# Patient Record
Sex: Female | Born: 1950 | Race: White | Hispanic: No | Marital: Married | Smoking: Never smoker
Health system: Southern US, Community
[De-identification: ages and names within clinical notes are randomized; demographics above are authoritative.]

## PROBLEM LIST (undated history)

## (undated) DIAGNOSIS — E7800 Pure hypercholesterolemia, unspecified: Secondary | ICD-10-CM

## (undated) DIAGNOSIS — Z973 Presence of spectacles and contact lenses: Secondary | ICD-10-CM

## (undated) DIAGNOSIS — I10 Essential (primary) hypertension: Secondary | ICD-10-CM

## (undated) DIAGNOSIS — K219 Gastro-esophageal reflux disease without esophagitis: Secondary | ICD-10-CM

## (undated) DIAGNOSIS — C50919 Malignant neoplasm of unspecified site of unspecified female breast: Secondary | ICD-10-CM

## (undated) DIAGNOSIS — Z9889 Other specified postprocedural states: Secondary | ICD-10-CM

## (undated) DIAGNOSIS — Z9289 Personal history of other medical treatment: Secondary | ICD-10-CM

## (undated) DIAGNOSIS — R12 Heartburn: Secondary | ICD-10-CM

## (undated) DIAGNOSIS — R002 Palpitations: Secondary | ICD-10-CM

## (undated) DIAGNOSIS — Z923 Personal history of irradiation: Secondary | ICD-10-CM

## (undated) HISTORY — PX: THYROID SURGERY: SHX805

## (undated) HISTORY — PX: CHOLECYSTECTOMY: SHX55

## (undated) HISTORY — PX: COLONOSCOPY: SHX174

## (undated) HISTORY — DX: Pure hypercholesterolemia, unspecified: E78.00

## (undated) HISTORY — DX: Heartburn: R12

## (undated) HISTORY — DX: Other specified postprocedural states: Z98.890

## (undated) HISTORY — PX: OTHER SURGICAL HISTORY: SHX169

## (undated) HISTORY — DX: Malignant neoplasm of unspecified site of unspecified female breast: C50.919

## (undated) HISTORY — DX: Personal history of other medical treatment: Z92.89

## (undated) HISTORY — DX: Presence of spectacles and contact lenses: Z97.3

## (undated) HISTORY — DX: Gastro-esophageal reflux disease without esophagitis: K21.9

## (undated) HISTORY — DX: Palpitations: R00.2

---

## 1997-11-28 ENCOUNTER — Ambulatory Visit (HOSPITAL_COMMUNITY): Admission: RE | Admit: 1997-11-28 | Discharge: 1997-11-28 | Payer: Self-pay | Admitting: Obstetrics and Gynecology

## 1999-06-05 ENCOUNTER — Other Ambulatory Visit: Admission: RE | Admit: 1999-06-05 | Discharge: 1999-06-05 | Payer: Self-pay | Admitting: Gynecology

## 2000-08-04 ENCOUNTER — Other Ambulatory Visit: Admission: RE | Admit: 2000-08-04 | Discharge: 2000-08-04 | Payer: Self-pay | Admitting: Obstetrics and Gynecology

## 2000-08-22 ENCOUNTER — Encounter: Admission: RE | Admit: 2000-08-22 | Discharge: 2000-08-22 | Payer: Self-pay | Admitting: General Surgery

## 2000-08-22 ENCOUNTER — Encounter (HOSPITAL_BASED_OUTPATIENT_CLINIC_OR_DEPARTMENT_OTHER): Payer: Self-pay | Admitting: General Surgery

## 2000-10-31 ENCOUNTER — Other Ambulatory Visit: Admission: RE | Admit: 2000-10-31 | Discharge: 2000-10-31 | Payer: Self-pay | Admitting: Obstetrics and Gynecology

## 2001-02-22 ENCOUNTER — Encounter: Payer: Self-pay | Admitting: Obstetrics and Gynecology

## 2001-02-22 ENCOUNTER — Encounter: Admission: RE | Admit: 2001-02-22 | Discharge: 2001-02-22 | Payer: Self-pay | Admitting: Obstetrics and Gynecology

## 2001-08-02 ENCOUNTER — Encounter: Payer: Self-pay | Admitting: Chiropractic Medicine

## 2001-08-02 ENCOUNTER — Encounter: Admission: RE | Admit: 2001-08-02 | Discharge: 2001-08-02 | Payer: Self-pay | Admitting: Chiropractic Medicine

## 2001-09-07 ENCOUNTER — Other Ambulatory Visit: Admission: RE | Admit: 2001-09-07 | Discharge: 2001-09-07 | Payer: Self-pay | Admitting: Obstetrics and Gynecology

## 2002-09-14 ENCOUNTER — Other Ambulatory Visit: Admission: RE | Admit: 2002-09-14 | Discharge: 2002-09-14 | Payer: Self-pay | Admitting: Obstetrics and Gynecology

## 2003-10-03 ENCOUNTER — Other Ambulatory Visit: Admission: RE | Admit: 2003-10-03 | Discharge: 2003-10-03 | Payer: Self-pay | Admitting: Obstetrics and Gynecology

## 2004-03-03 ENCOUNTER — Encounter: Admission: RE | Admit: 2004-03-03 | Discharge: 2004-03-03 | Payer: Self-pay | Admitting: Family Medicine

## 2004-07-28 ENCOUNTER — Ambulatory Visit (HOSPITAL_COMMUNITY): Admission: RE | Admit: 2004-07-28 | Discharge: 2004-07-28 | Payer: Self-pay | Admitting: *Deleted

## 2004-07-28 ENCOUNTER — Encounter (INDEPENDENT_AMBULATORY_CARE_PROVIDER_SITE_OTHER): Payer: Self-pay | Admitting: *Deleted

## 2004-11-16 ENCOUNTER — Other Ambulatory Visit: Admission: RE | Admit: 2004-11-16 | Discharge: 2004-11-16 | Payer: Self-pay | Admitting: Obstetrics and Gynecology

## 2005-10-01 ENCOUNTER — Ambulatory Visit (HOSPITAL_COMMUNITY): Admission: RE | Admit: 2005-10-01 | Discharge: 2005-10-01 | Payer: Self-pay | Admitting: *Deleted

## 2005-10-01 ENCOUNTER — Encounter (INDEPENDENT_AMBULATORY_CARE_PROVIDER_SITE_OTHER): Payer: Self-pay | Admitting: Specialist

## 2006-02-23 ENCOUNTER — Encounter (INDEPENDENT_AMBULATORY_CARE_PROVIDER_SITE_OTHER): Payer: Self-pay | Admitting: *Deleted

## 2006-02-23 ENCOUNTER — Ambulatory Visit (HOSPITAL_COMMUNITY): Admission: RE | Admit: 2006-02-23 | Discharge: 2006-02-23 | Payer: Self-pay | Admitting: Obstetrics and Gynecology

## 2006-03-03 ENCOUNTER — Other Ambulatory Visit: Admission: RE | Admit: 2006-03-03 | Discharge: 2006-03-03 | Payer: Self-pay | Admitting: *Deleted

## 2006-06-02 ENCOUNTER — Encounter (INDEPENDENT_AMBULATORY_CARE_PROVIDER_SITE_OTHER): Payer: Self-pay | Admitting: *Deleted

## 2006-06-02 ENCOUNTER — Ambulatory Visit (HOSPITAL_COMMUNITY): Admission: RE | Admit: 2006-06-02 | Discharge: 2006-06-03 | Payer: Self-pay | Admitting: Surgery

## 2006-07-19 HISTORY — PX: TOTAL THYROIDECTOMY: SHX2547

## 2006-11-01 ENCOUNTER — Ambulatory Visit: Payer: Self-pay | Admitting: Vascular Surgery

## 2006-11-15 ENCOUNTER — Ambulatory Visit: Payer: Self-pay | Admitting: Vascular Surgery

## 2007-02-28 ENCOUNTER — Ambulatory Visit: Payer: Self-pay | Admitting: Vascular Surgery

## 2007-08-10 ENCOUNTER — Ambulatory Visit: Payer: Self-pay | Admitting: Vascular Surgery

## 2007-12-19 ENCOUNTER — Ambulatory Visit: Payer: Self-pay | Admitting: Vascular Surgery

## 2008-03-04 ENCOUNTER — Ambulatory Visit (HOSPITAL_COMMUNITY): Admission: RE | Admit: 2008-03-04 | Discharge: 2008-03-04 | Payer: Self-pay | Admitting: *Deleted

## 2008-03-04 ENCOUNTER — Encounter (INDEPENDENT_AMBULATORY_CARE_PROVIDER_SITE_OTHER): Payer: Self-pay | Admitting: *Deleted

## 2009-04-14 DIAGNOSIS — Z9889 Other specified postprocedural states: Secondary | ICD-10-CM

## 2009-04-14 HISTORY — DX: Other specified postprocedural states: Z98.890

## 2010-08-06 ENCOUNTER — Encounter
Admission: RE | Admit: 2010-08-06 | Discharge: 2010-08-06 | Payer: Self-pay | Source: Home / Self Care | Attending: Obstetrics and Gynecology | Admitting: Obstetrics and Gynecology

## 2010-08-07 ENCOUNTER — Emergency Department (HOSPITAL_COMMUNITY)
Admission: EM | Admit: 2010-08-07 | Discharge: 2010-08-08 | Payer: Self-pay | Source: Home / Self Care | Admitting: Emergency Medicine

## 2010-08-11 LAB — URINE MICROSCOPIC-ADD ON

## 2010-08-11 LAB — URINALYSIS, ROUTINE W REFLEX MICROSCOPIC
Bilirubin Urine: NEGATIVE
Ketones, ur: 15 mg/dL — AB
Nitrite: NEGATIVE
Protein, ur: >300 mg/dL — AB
Specific Gravity, Urine: 1.017 (ref 1.005–1.030)
Urine Glucose, Fasting: NEGATIVE mg/dL
Urobilinogen, UA: 0.2 mg/dL (ref 0.0–1.0)
pH: 5.5 (ref 5.0–8.0)

## 2010-08-11 LAB — URINE CULTURE: Colony Count: >=100000

## 2010-10-19 ENCOUNTER — Other Ambulatory Visit: Payer: Self-pay | Admitting: Chiropractor

## 2010-10-19 DIAGNOSIS — M542 Cervicalgia: Secondary | ICD-10-CM

## 2010-10-20 ENCOUNTER — Ambulatory Visit
Admission: RE | Admit: 2010-10-20 | Discharge: 2010-10-20 | Disposition: A | Discharge status: Home / Self Care | Payer: BC Managed Care – PPO | Source: Ambulatory Visit | Attending: Chiropractor | Admitting: Chiropractor

## 2010-10-20 DIAGNOSIS — M542 Cervicalgia: Secondary | ICD-10-CM

## 2010-12-01 NOTE — Op Note (Signed)
NAMETAKERRA, LUPINACCI           ACCOUNT NO.:  [id number]   MEDICAL RECORD NO.:  [id number]          PATIENT TYPE:  AMB   LOCATION:  ENDO                         FACILITY:  [facility]   PHYSICIAN:  Georgiana Spinner, M.D.    DATE OF BIRTH:  10-13-1950   DATE OF PROCEDURE:  DATE OF DISCHARGE:                               OPERATIVE REPORT   PROCEDURE:  Upper endoscopy.   INDICATIONS:  Gastroesophageal reflux disease with question of Barrett  esophagus.   ANESTHESIA:  Fentanyl 100 mcg, Versed 10 mg.   PROCEDURE:  With the patient mildly sedated in the left lateral  decubitus position the Pentax videoscopic endoscope was inserted in the  mouth, passed under direct vision through the esophagus which appeared  normal until we reached the distal esophagus and there appeared to be an  area of Barrett's, photographed and biopsied.  We entered and we pulled  back and there was a subsequent area on the other side of the esophagus  which also appeared to be possibly Barrett's, photographed and biopsied.  We entered into the stomach fundus, body, antrum, duodenal bulb, second  portion duodenum were visualized.  From this point the endoscope was  slowly withdrawn taking circumferential views of duodenal mucosa until  the endoscope had been pulled back into stomach and placed in  retroflexion to view the stomach from below and a small polyp was seen,  photographed and biopsied.  The endoscope was straightened and withdrawn  taking circumferential views of the remaining gastric and esophageal  mucosa.  The patient's vital signs and pulse oximeter remained stable.  The patient tolerated the procedure well without apparent complication.   FINDINGS:  1. Polyp in the fundus of the stomach biopsied.  2. Changes of possible Barrett's esophagus biopsied.  3. Await biopsy reports.  4. The patient will call me for results and follow-up with me as      needed as an outpatient.     ______________________________  Georgiana Spinner, M.D.     GMO/MEDQ  D:  03/04/2008  T:  03/04/2008  Job:  [id number]

## 2010-12-01 NOTE — Assessment & Plan Note (Signed)
OFFICE VISIT   Elizabeth, Berg  DOB:  05/08/1951                                       02/28/2007  OACZY#:[id number]   SUBJECTIVE:  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  She has worn elastic  compression stockings with no improvement of her symptoms which she  describes as an aching and burning discomfort in the distal thigh and  proximal calf area.  She has some improvement with elevation but this  does not completely relieve her symptoms.  She has been trying  analgesics as well.  Venous duplex exam was performed which revealed an  incompetent branch off the saphenofemoral junction on the left side  which eventually communicates with these varicosities and does have  reflux within it.  The right side has an anterior branch of the  circumflex vein which is incompetent but then becomes quite small.  There is no reflux at the saphenofemoral junction bilaterally.   IMPRESSION AND PLAN:  I think her painful varicosities would be best  treated by a combination of sclerotherapy and skin laser treatments in  the left distal thigh and proximal calf area and in the right medial  calf area as well as around the ankles on both sides.  Because she does  have venous hypertension, from the reflux in these branches off the  greater saphenous system, we will proceed with precertification with her  insurance company, to treat these painful varicosities.   Quita Skye Hart [place], M.D.  Electronically Signed   JDL/MEDQ  D:  02/28/2007  T:  03/02/2007  Job:  [id number]

## 2010-12-01 NOTE — Assessment & Plan Note (Signed)
OFFICE VISIT   Elizabeth Berg, Elizabeth Berg  DOB:  29-Mar-1951                                       12/19/2007  HYQMV#:[id number]   The patient underwent sclerotherapy and superficial skin laser  treatments of varicosities in January of this year.  She had no large  great saphenous vein with reflux on her preoperative duplex scan.  She  did have some reflux through some incompetent branches in both legs.  She has been fairly pleased with her cosmetic result with the exception  of some mild erythema in the lower third of her legs at times but does  occasionally have discomfort in the calves which seems to be relieved by  wearing elastic compression stockings for a few days.  She has had no  consistent edema in the ankle or foot.   On exam the larger varicosities noted before have been treated  successfully.  She does have some spider veins in clusters in the lower  third of both legs.  There is no distal edema.  She has excellent  arterial pulses.  She was reassured regarding these findings and I do  not think any further treatment of the varicosities is indicated at this  time.   Quita Skye Hart [place], M.D.  Electronically Signed   JDL/MEDQ  D:  12/19/2007  T:  12/20/2007  Job:  [id number]

## 2010-12-04 NOTE — Op Note (Signed)
NAMETYESE, FINKEN           ACCOUNT NO.:  [id number]   MEDICAL RECORD NO.:  [id number]          PATIENT TYPE:  AMB   LOCATION:  ENDO                         FACILITY:  [facility]   PHYSICIAN:  Georgiana Spinner, M.D.    DATE OF BIRTH:  [birth year]   DATE OF PROCEDURE:  07/28/2004  DATE OF DISCHARGE:                                 OPERATIVE REPORT   PROCEDURE:  Colonoscopy.   INDICATIONS:  Rectal bleeding.   ANESTHESIA:  1.  Demerol 25.  2.  Versed 2.5 mg.   PROCEDURE:  With the patient mildly sedated in the left lateral decubitus  position, the Olympus videoscopic colonoscope was inserted in the rectum and  passed under direct vision to the cecum, identified by the ileocecal valve  and appendiceal orifice, both of which were photographed.  From this point  the colonoscope was slowly withdrawn, taking circumferential views of the  colonic mucosa, stopping only in the rectum which appeared normal on direct  and showed some mild hemorrhoids on retroflexed view.  The endoscope was  straightened and withdrawn.  Patient's vital signs and pulse oximetry  remained stable.  The patient tolerated the procedure without apparent  complications.   FINDINGS:  Internal hemorrhoids, otherwise an unremarkable colonoscopic  examination to the cecum.   PLAN:  See endoscopy note for further followup.       GMO/MEDQ  D:  07/28/2004  T:  07/28/2004  Job:  [id number]

## 2010-12-04 NOTE — Op Note (Signed)
Elizabeth Berg, Elizabeth Berg           ACCOUNT NO.:  [id number]   MEDICAL RECORD NO.:  [id number]          PATIENT TYPE:  AMB   LOCATION:  SDC                           FACILITY:  [facility]   PHYSICIAN:  Malva Limes, M.D.    DATE OF BIRTH:  [birth year]   DATE OF PROCEDURE:  02/23/2006  DATE OF DISCHARGE:                                 OPERATIVE REPORT   PREOPERATIVE DIAGNOSES:  1.  Prolapsed submucous fibroid.  2.  Postmenopausal bleeding.   POSTOPERATIVE DIAGNOSES:  1.  Prolapsed submucous fibroid.  2.  Postmenopausal bleeding.   PROCEDURES:  1.  Resection of fibroid.  2.  D&C.   SURGEON:  Malva Limes, M.D.   ANESTHESIA:  MAC with paracervical block.   DRAINS:  None.   ANTIBIOTICS:  Ancef 1 g.   COMPLICATIONS:  None.   SPECIMENS:  Fibroid and endometrial curettings sent to pathology.   PROCEDURE:  The patient was taken to the operating room, where she was  placed in the dorsal lithotomy position.  MAC anesthesia was administered  without complications.  She was then prepped with Betadine and draped in the  usual fashion for this procedure.  Then, 20 mL of 1% lidocaine was used for  paracervical block.  The patient had a 3 cm fibroid prolapsing through her  cervical os.  This was grasped with a single-tooth tenaculum and the stalk  palpated to originate in the posterior lower uterine segment of the uterus.  At this point, the Mayo scissors were used to transect the fibroid at the  base. The specimen was handed off and sent to pathology.  Following this,  the hysteroscope was set up and placed into the uterine cavity. On viewing  the endometrial lining, the patient had no evidence of any kind of polyps or  other fibroids. Following this, the patient had had an endometrial  curettage.  The  tissue was sent to pathology.  The patient tolerated the procedure well.  She was taken to the recovery room in stable condition.  The instrument and  lap count was correct x1.  The  patient will be discharged to home with  Darvocet to take p.r.n.  She will follow up in the office in 4 weeks.           ______________________________  Malva Limes, M.D.     MA/MEDQ  D:  02/23/2006  T:  02/23/2006  Job:  [id number]

## 2010-12-04 NOTE — Op Note (Signed)
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   PROCEDURE:  Total thyroidectomy.   SURGEON:  Velora Heckler, MD, FACS   ASSISTANT:  Lorelee New, MD and Manus Rudd, MD   ANESTHESIA:  General.   ESTIMATED BLOOD LOSS:  Minimal.   PREPARATION:  Betadine.   COMPLICATIONS:  None.   INDICATIONS:  The patient is 60 year old white female from [place],  [place].  The patient was noted on evaluation by her chiropractor to  have an enlarged thyroid gland.  She underwent thyroid ultrasound and  nuclear medicine scanning.  She was found to have multinodular goiter.  Bilateral fine-needle aspiration cytology was performed and demonstrated  Hurthle cell change.  The patient had developed mild dysphagia, particularly  when taking pills.  She now comes to surgery for thyroidectomy.   Procedure is done in OR #17 at the [facility].  The  patient is brought to the operating room, placed in a supine position on the  operating room table.  Following administration of general anesthesia, the  patient is positioned and then prepped and draped in the usual strict  aseptic fashion.  After ascertaining that an adequate level of anesthesia  been obtained, a Kocher incision was made with a #15 blade.  Dissection is  carried through subcutaneous tissues with the electrocautery and hemostasis  obtained.  Platysma is divided.  Skin flaps  were then developed cephalad and  caudad from the thyroid notch to the sternal notch.  A Mahorner self-  retaining retractor is placed for exposure.  Strap muscles are incised in  the midline.  Dissection is begun on the left side of the neck.  Left  thyroid lobe was gently mobilized with a blunt Barista.  Venous  tributaries are taken down with the harmonic scalpel.  Lobe is gently rolled  anteriorly.  Inferior venous tributaries are divided with the harmonic  scalpel.  Care is taken to preserve parathyroid tissue.  Superior pole is  dissected out.  Superior pole vessels are divided with the harmonic scalpel.  Gland is rolled anteriorly.  Branches of the inferior thyroid artery are  divided with the harmonic scalpel.  Recurrent nerve is identified and  preserved.  Dissection is carried down to the ligament of Allyson Sabal which is  transected with the electrocautery, and the gland is rolled up and onto the  anterior trachea.   Next, we turned our attention to the right thyroid lobe.  Again, strap  muscles are gently mobilized off the anterior capsule of the thyroid and  reflected laterally.  Venous tributaries are divided with the harmonic  scalpel.  The inferior parathyroid gland is identified on the capsule of the  thyroid on the right.  It is mobilized gently using the electrocautery for  hemostasis.  It is dissected off the capsule on its vascular pedicle which  is preserved.  Inferior venous tributaries are divided with the harmonic  scalpel.  Superior pole is dissected out.  There is a small pyramidal lobe  which is also dissected out using the harmonic scalpel.  Superior pole  vessels are divided with the harmonic scalpel.  Gland is rolled anteriorly.  Branches of the inferior thyroid artery are divided between small Ligaclips  near the recurrent nerve which is positively identified and preserved.  There is a moderate size cystic nodule adjacent to the parathyroid.  This  plane  is gently developed and vessels divided between Ligaclips with the  scissors.  Gland is rolled anteriorly.  Ligament of Allyson Sabal is transected  using the electrocautery.  Remainder of the thyroid is excised off the  anterior trachea using the harmonic scalpel.  Specimen is submitted to  pathology for review.  Neck is irrigated with warm saline, and Surgicel was  placed over the area of the recurrent nerves and parathyroid glands  bilaterally.  Good hemostasis is achieved.  Strap muscles are reapproximated  in the midline with interrupted 3-0 Vicryl sutures.  Platysma is closed with  interrupted 3-0 Vicryl sutures.  Skin is closed with a running 4-0 Vicryl  subcuticular suture.  Wound is washed and dried, and Benzoin and Steri-  Strips are applied.  Sterile dressings are applied.  The patient is awakened  from anesthesia and brought to the recovery room in stable condition.  The  patient tolerated the procedure well.      Velora Heckler, MD  Electronically Signed     TMG/MEDQ  D:  06/02/2006  T:  06/02/2006  Job:  [id number]   cc:   Elizabeth Berg, M.D.  Elizabeth Decant. Janey Greaser, MD

## 2010-12-04 NOTE — Op Note (Signed)
Elizabeth Berg, Elizabeth Berg           ACCOUNT NO.:  [id number]   MEDICAL RECORD NO.:  [id number]          PATIENT TYPE:  AMB   LOCATION:  ENDO                         FACILITY:  [facility]   PHYSICIAN:  Georgiana Spinner, M.D.    DATE OF BIRTH:  1950/08/31   DATE OF PROCEDURE:  07/28/2004  DATE OF DISCHARGE:                                 OPERATIVE REPORT   PROCEDURE:  Upper endoscopy.   ENDOSCOPIST:  Georgiana Spinner, M.D.   INDICATIONS FOR PROCEDURE:  Gastroesophageal reflux disease.   ANESTHESIA:  Demerol 75 mg, Versed 7.5 mg.   DESCRIPTION OF PROCEDURE:  With the patient mildly sedated in the left  lateral decubitus position, the Olympus videoscopic endoscope was inserted  in the mouth and passed under direct vision through the esophagus, which  appeared normal until we reached the distal esophagus, which appeared  somewhat inflamed and reddened.  This was photographed and biopsied.  We  entered into the stomach.  The fundus, body, antrum, duodenal bulb, second  portion of the duodenum were visualized and appeared normal.  From this  point the endoscope was then slowly withdrawn, taking circumferential views  of the duodenal mucosa until the endoscope had been pulled back into the  stomach, and placed in retroflexion to view the stomach from below.  It  revealed a very lax GE junction.  I could see up the esophagus, which was  quite patent.  On retroflexed view the endoscope was then straightened and  withdrawn, taking circumferential views of the remaining gastric and  esophageal mucosa.  The patient's vital signs and pulse oximeter remained  stable.   The patient tolerated the procedure well, without apparent complications.   FINDINGS:  Significant laxity of the gastroesophageal junction with free  reflux and erythema of the distal esophagus.   BIOPSY:  Await biopsy report.   PLAN:  The patient will follow up with me as an outpatient.       GMO/MEDQ  D:  07/28/2004  T:   07/28/2004  Job:  [id number]

## 2010-12-04 NOTE — Op Note (Signed)
NAMEJAMI, Elizabeth Berg           ACCOUNT NO.:  [id number]   MEDICAL RECORD NO.:  [id number]          PATIENT TYPE:  AMB   LOCATION:  ENDO                         FACILITY:  [facility]   PHYSICIAN:  Georgiana Spinner, M.D.    DATE OF BIRTH:  Oct 29, 1950   DATE OF PROCEDURE:  10/01/2005  DATE OF DISCHARGE:                                 OPERATIVE REPORT   PROCEDURE:  Upper endoscopy with biopsy.   INDICATIONS:  Possible Barrett's esophagus.   ANESTHESIA:  Demerol 60 Versed 6 mg.   DESCRIPTION OF PROCEDURE:  With the patient mildly sedated in the left  lateral decubitus position, the Olympus videoscopic endoscope was inserted  in the mouth, passed under direct vision through the esophagus which  appeared normal until we reached distal esophagus; and there were changes  consistent with Barrett's, photographed, and biopsies were taken. We entered  into the stomach fundus, body, antrum, duodenal bulb, and second portion  duodenum were visualized. From this point, the endoscope was slowly  withdrawn taking circumferential views of the duodenal mucosa until the  endoscope was then pulled back into the stomach; placed in retroflexion to  review the stomach from below. The endoscope was straightened and withdrawn  taking circumferential views of the remaining gastric and esophageal mucosa.  The patient's vital signs and  pulse oximeter remained stable. The patient  tolerated the procedure well without apparent complications.   FINDINGS:  Widely patent gastroesophageal junction with visualization of the  esophagus seen from the stomach indicating laxity at this point and possible  Barrett's esophagus. Await biopsy report. The patient will call me for  results and follow up with me as an outpatient.           ______________________________  Georgiana Spinner, M.D.     GMO/MEDQ  D:  10/01/2005  T:  10/03/2005  Job:  [id number]

## 2011-01-11 ENCOUNTER — Other Ambulatory Visit: Payer: Self-pay | Admitting: Dermatology

## 2011-07-20 DIAGNOSIS — C50919 Malignant neoplasm of unspecified site of unspecified female breast: Secondary | ICD-10-CM | POA: Insufficient documentation

## 2011-08-18 ENCOUNTER — Other Ambulatory Visit: Payer: Self-pay | Admitting: Dermatology

## 2011-10-25 ENCOUNTER — Other Ambulatory Visit: Payer: Self-pay | Admitting: Dermatology

## 2011-11-02 ENCOUNTER — Other Ambulatory Visit: Payer: Self-pay | Admitting: Obstetrics and Gynecology

## 2011-11-04 ENCOUNTER — Other Ambulatory Visit: Payer: Self-pay | Admitting: Obstetrics and Gynecology

## 2011-11-04 DIAGNOSIS — R928 Other abnormal and inconclusive findings on diagnostic imaging of breast: Secondary | ICD-10-CM

## 2011-11-05 ENCOUNTER — Ambulatory Visit
Admission: RE | Admit: 2011-11-05 | Discharge: 2011-11-05 | Disposition: A | Discharge status: Home / Self Care | Payer: BC Managed Care – PPO | Source: Ambulatory Visit | Attending: Obstetrics and Gynecology | Admitting: Obstetrics and Gynecology

## 2011-11-05 DIAGNOSIS — R928 Other abnormal and inconclusive findings on diagnostic imaging of breast: Secondary | ICD-10-CM

## 2011-11-08 ENCOUNTER — Other Ambulatory Visit: Payer: Self-pay | Admitting: Obstetrics and Gynecology

## 2011-11-08 DIAGNOSIS — N63 Unspecified lump in unspecified breast: Secondary | ICD-10-CM

## 2011-11-12 ENCOUNTER — Other Ambulatory Visit: Payer: BC Managed Care – PPO

## 2011-11-15 ENCOUNTER — Other Ambulatory Visit: Payer: Self-pay | Admitting: Obstetrics and Gynecology

## 2011-11-15 DIAGNOSIS — N63 Unspecified lump in unspecified breast: Secondary | ICD-10-CM

## 2011-11-18 ENCOUNTER — Other Ambulatory Visit: Payer: BC Managed Care – PPO

## 2011-11-22 ENCOUNTER — Other Ambulatory Visit: Payer: Self-pay | Admitting: Diagnostic Radiology

## 2011-11-22 ENCOUNTER — Ambulatory Visit
Admission: RE | Admit: 2011-11-22 | Discharge: 2011-11-22 | Disposition: A | Discharge status: Home / Self Care | Payer: BC Managed Care – PPO | Source: Ambulatory Visit | Attending: Obstetrics and Gynecology | Admitting: Obstetrics and Gynecology

## 2011-11-22 DIAGNOSIS — N63 Unspecified lump in unspecified breast: Secondary | ICD-10-CM

## 2011-11-22 HISTORY — PX: BREAST BIOPSY: SHX20

## 2011-11-23 ENCOUNTER — Other Ambulatory Visit: Payer: Self-pay | Admitting: Obstetrics and Gynecology

## 2011-11-23 DIAGNOSIS — C50911 Malignant neoplasm of unspecified site of right female breast: Secondary | ICD-10-CM

## 2011-11-25 ENCOUNTER — Telehealth: Payer: Self-pay | Admitting: *Deleted

## 2011-11-25 ENCOUNTER — Other Ambulatory Visit: Payer: Self-pay | Admitting: *Deleted

## 2011-11-25 DIAGNOSIS — C50319 Malignant neoplasm of lower-inner quadrant of unspecified female breast: Secondary | ICD-10-CM

## 2011-11-25 DIAGNOSIS — C50311 Malignant neoplasm of lower-inner quadrant of right female breast: Secondary | ICD-10-CM | POA: Insufficient documentation

## 2011-11-25 NOTE — Telephone Encounter (Signed)
Confirmed [facility] for 12/01/11 at 0800 .  Instructions and contact information given.  

## 2011-11-26 ENCOUNTER — Ambulatory Visit
Admission: RE | Admit: 2011-11-26 | Discharge: 2011-11-26 | Disposition: A | Discharge status: Home / Self Care | Payer: BC Managed Care – PPO | Source: Ambulatory Visit | Attending: Obstetrics and Gynecology | Admitting: Obstetrics and Gynecology

## 2011-11-26 DIAGNOSIS — C50911 Malignant neoplasm of unspecified site of right female breast: Secondary | ICD-10-CM

## 2011-11-26 MED ORDER — GADOBENATE DIMEGLUMINE 529 MG/ML IV SOLN
17.0000 mL | Freq: Once | INTRAVENOUS | Status: AC | PRN
  Administered 2011-11-26: 17 mL via INTRAVENOUS

## 2011-12-01 ENCOUNTER — Other Ambulatory Visit (HOSPITAL_COMMUNITY): Payer: Self-pay | Admitting: Surgery

## 2011-12-01 ENCOUNTER — Encounter: Payer: Self-pay | Admitting: *Deleted

## 2011-12-01 ENCOUNTER — Encounter: Payer: Self-pay | Admitting: Oncology

## 2011-12-01 ENCOUNTER — Encounter: Payer: Self-pay | Admitting: Specialist

## 2011-12-01 ENCOUNTER — Ambulatory Visit (HOSPITAL_BASED_OUTPATIENT_CLINIC_OR_DEPARTMENT_OTHER): Payer: BC Managed Care – PPO | Admitting: Oncology

## 2011-12-01 ENCOUNTER — Ambulatory Visit
Admission: RE | Admit: 2011-12-01 | Discharge: 2011-12-01 | Disposition: A | Discharge status: Home / Self Care | Payer: BC Managed Care – PPO | Source: Ambulatory Visit | Attending: Radiation Oncology | Admitting: Radiation Oncology

## 2011-12-01 ENCOUNTER — Ambulatory Visit: Payer: BC Managed Care – PPO

## 2011-12-01 ENCOUNTER — Ambulatory Visit: Payer: BC Managed Care – PPO | Attending: Surgery | Admitting: Physical Therapy

## 2011-12-01 ENCOUNTER — Ambulatory Visit (HOSPITAL_BASED_OUTPATIENT_CLINIC_OR_DEPARTMENT_OTHER): Payer: BC Managed Care – PPO | Admitting: Surgery

## 2011-12-01 ENCOUNTER — Other Ambulatory Visit: Payer: BC Managed Care – PPO | Admitting: Lab

## 2011-12-01 ENCOUNTER — Telehealth: Payer: Self-pay | Admitting: Oncology

## 2011-12-01 VITALS — BP 134/84 | HR 73 | Temp 98.6°F | Ht 63.5 in | Wt 191.1 lb

## 2011-12-01 DIAGNOSIS — Z170 Estrogen receptor positive status [ER+]: Secondary | ICD-10-CM

## 2011-12-01 DIAGNOSIS — C50319 Malignant neoplasm of lower-inner quadrant of unspecified female breast: Secondary | ICD-10-CM

## 2011-12-01 DIAGNOSIS — R293 Abnormal posture: Secondary | ICD-10-CM | POA: Insufficient documentation

## 2011-12-01 DIAGNOSIS — C50919 Malignant neoplasm of unspecified site of unspecified female breast: Secondary | ICD-10-CM | POA: Insufficient documentation

## 2011-12-01 DIAGNOSIS — IMO0001 Reserved for inherently not codable concepts without codable children: Secondary | ICD-10-CM | POA: Insufficient documentation

## 2011-12-01 LAB — COMPREHENSIVE METABOLIC PANEL
AST: 18 U/L (ref 0–37)
Albumin: 3.9 g/dL (ref 3.5–5.2)
BUN: 22 mg/dL (ref 6–23)
Calcium: 9.6 mg/dL (ref 8.4–10.5)
Chloride: 99 mEq/L (ref 96–112)
Creatinine, Ser: 1.01 mg/dL (ref 0.50–1.10)
Glucose, Bld: 108 mg/dL — ABNORMAL HIGH (ref 70–99)
Potassium: 4.4 mEq/L (ref 3.5–5.3)

## 2011-12-01 LAB — CBC WITH DIFFERENTIAL/PLATELET
BASO%: 0.7 % (ref 0.0–2.0)
Basophils Absolute: 0 10*3/uL (ref 0.0–0.1)
HCT: 43.7 % (ref 34.8–46.6)
HGB: 14.7 g/dL (ref 11.6–15.9)
LYMPH%: 30.9 % (ref 14.0–49.7)
MCH: 29.4 pg (ref 25.1–34.0)
MCHC: 33.6 g/dL (ref 31.5–36.0)
MONO#: 0.4 10*3/uL (ref 0.1–0.9)
NEUT%: 60.7 % (ref 38.4–76.8)
Platelets: 232 10*3/uL (ref 145–400)
WBC: 5.6 10*3/uL (ref 3.9–10.3)
lymph#: 1.7 10*3/uL (ref 0.9–3.3)

## 2011-12-01 NOTE — Telephone Encounter (Signed)
gve the pt her June 2013 appt calendar 

## 2011-12-01 NOTE — Progress Notes (Signed)
Patient came in today as a new patient with her husband and she has two insurance,they said that they were oh kay far as financial assistance.

## 2011-12-01 NOTE — Progress Notes (Signed)
Radiation Oncology         [phone]) [phone] ________________________________  Initial outpatient Consultation  Name: Elizabeth Berg MRN: [id number]  Date: 12/01/2011  DOB: [birth year]  CC:Jonette Mate, MD   REFERRING PHYSICIAN: Kandis Cocking, MD  DIAGNOSIS: T1N0 right breast cancer  HISTORY OF PRESENT ILLNESS::Elizabeth Berg is a 61 y.o. female who is seen today in multidisciplinary clinic. She went in for a screening mammogram and was found to have a 5 mm abnormality at the right breast. Biopsy confirmed an invasive ductal carcinoma which was ER positive at 100% progesterone positive at 37% Ki-67 was 9% HER-2 was not amplified. An MRI of the bilateral breast showed a 1.4 x 1.2 x 0.9 cm abnormality consistent with biopsy change in clip artifact. No abnormalities were seen in the left breast. No enlarged internal mammary or axillary lymph nodes were noted. She was asymptomatic from this mass. She denies any breast pain or bleeding.  PREVIOUS RADIATION THERAPY: No  PAST MEDICAL HISTORY:  has a past medical history of H/O colonoscopy (04/14/09); H/O bone density study; Wears glasses; Heart burn; and Breast cancer.    PAST SURGICAL HISTORY: Past Surgical History  Procedure Date  . Cholecystectomy   . Thyroid surgery     FAMILY HISTORY: family history includes Esophageal cancer in her mother and Rectal cancer in her brother.  SOCIAL HISTORY:  reports that she has never smoked. She does not have any smokeless tobacco history on file. She reports that she does not drink alcohol or use illicit drugs.  ALLERGIES: Penicillins  MEDICATIONS:  Current Outpatient Prescriptions  Medication Sig Dispense Refill  . levothyroxine (SYNTHROID, LEVOTHROID) 137 MCG tablet Take 137 mcg by mouth daily.      Marland Kitchen lisinopril (PRINIVIL,ZESTRIL) 10 MG tablet Take 10 mg by mouth daily.      Marland Kitchen omeprazole (PRILOSEC) 20 MG capsule Take 20 mg by mouth daily.      . rosuvastatin (CRESTOR)  10 MG tablet Take 10 mg by mouth daily.      . verapamil (CALAN) 120 MG tablet Take 120 mg by mouth daily.        REVIEW OF SYSTEMS:  A 15 point review of systems is documented in the electronic medical record. This was obtained by the nursing staff. However, I reviewed this with the patient to discuss relevant findings and make appropriate changes.  Pertinent items are noted in HPI.   PHYSICAL EXAM:  Wt Readings from Last 3 Encounters:  12/01/11 191 lb 1.6 oz (86.682 kg)   Temp Readings from Last 3 Encounters:  12/01/11 98.6 F (37 C) Oral   BP Readings from Last 3 Encounters:  12/01/11 134/84   Pulse Readings from Last 3 Encounters:  12/01/11 73   Of note she is presently not no distress sitting comfortably examining table. Examination of her cervical and supraclavicular lymph nodes reveals no enlarged nodes. Examination her right axilla shows no axillary adenopathy. Examination of the  right breast reveals biopsy change. She is a palpable seroma cavity in the upper outer quadrant associated with this biopsy site. Examination of the left breast reveals some palpable breast tissue in the left upper outer quadrant. There is no palpable left axillary adenopathy. She is alert and oriented x3. She has 5 out of 5 strength. She has no lymphedema.  LABORATORY DATA:  Lab Results  Component Value Date   WBC 5.6 12/01/2011   HGB 14.7 12/01/2011   HCT 43.7 12/01/2011  MCV 87.4 12/01/2011   PLT 232 12/01/2011   Lab Results  Component Value Date   NA 138 12/01/2011   K 4.4 12/01/2011   CL 99 12/01/2011   CO2 30 12/01/2011   Lab Results  Component Value Date   ALT 17 12/01/2011   AST 18 12/01/2011   ALKPHOS 67 12/01/2011   BILITOT 0.5 12/01/2011     RADIOGRAPHY: US Breast Right  11/15/2011  **ADDENDUM** CREATED: 11/15/2011 14:50:20  The patient's mammographic images were reviewed.  The mass in the medial aspect of the right breast likely corresponds with the mass seen in the superior aspect of  the breast.  Instead of MRI it was decided that a stereotactic biopsy could be performed in the CC projection.  The patient was contacted and made aware of the findings.  A stereotactic biopsy will be scheduled at her convenience.  Addended by:  Littie Deeds. Judyann Munson, M.D. on 11/15/2011 14:50:20.  **END ADDENDUM** SIGNED BY: Littie Deeds. Judyann Munson, M.D.    11/05/2011  *RADIOLOGY REPORT*  Clinical Data:  Abnormal right screening mammogram  DIGITAL DIAGNOSTIC RIGHT MAMMOGRAM WITH CAD AND RIGHT BREAST ULTRASOUND:  Comparison:  With prior mammograms dating back to 08/04/2000.  Findings:  There are scattered fibroglandular densities.  There is persistence of a 6 mm nodule far posteriorly and medial on the CC view. There is a 6 mm nodule in the inferior aspect of the right breast and an obscured 6 mm nodule in the superior aspect of the right breast.  It is felt that the superior lesion likely correlates with the lesion seen on the CC view.  There are no malignant-type microcalcifications. Mammographic images were processed with CAD.  On physical exam, I do not palpate a discrete mass in the right breast.  Ultrasound is performed, showing there is an anechoic lesion in the right breast at 5 o'clock 3 cm from the nipple measuring 4 x 4 x 4 mm.  There is increased through transmission.  No additional lesions are seen in the right breast.  There is no suspicious mass, abnormal shadowing or distortion seen in the superior aspect of the right breast.  IMPRESSION: Further evaluation of the right breast nodularity with MRI is recommended.  If the MRI is negative I would recommend short-term interval follow-up right mammogram in 6 months.  BI-RADS CATEGORY 0:  Incomplete.  Need additional imaging evaluation and/or prior mammograms for comparison.  Original Report Authenticated By: Littie Deeds. Judyann Munson, M.D.   Mr Breast Bilateral W Wo Contrast  11/26/2011  *RADIOLOGY REPORT*  Clinical Data: Recently diagnosed right breast upper inner quadrant invasive  mammary carcinoma manifesting as a mammographically evident mass  BUN and creatinine were obtained on site at [facility] at [address] Results:  BUN 17 mg/dL,  Creatinine 0.9 mg/dL.  BILATERAL BREAST MRI WITH AND WITHOUT CONTRAST  Technique: Multiplanar, multisequence MR images of both breasts were obtained prior to and following the intravenous administration of 17ml of Multihance.  Three dimensional images were evaluated at the independent DynaCad workstation.  Comparison:  Prior mammogram and ultrasound  Findings: There is minimally irregular nodular peripheral rim enhancement at the site of right upper inner quadrant biopsy cavity/clip artifact in the area of biopsy-proven breast cancer. This area measures 1.4 x 1.2 x 0.9 cm.  The remaining peripheral nodular component demonstrates plateau type enhancement kinetics. No other area of abnormal enhancement is seen in either breast.  No lymphadenopathy.  No abnormal T2-weighted hyperintensity. Minimal background parenchymal type  enhancement pattern.  IMPRESSION: Minimal residual irregular nodularity at the site of the right upper quadrant to biopsy-proven breast cancer, with associated clip artifact.  No MRI evidence for multicentric/multifocal or contralateral malignancy.  THREE-DIMENSIONAL MR IMAGE RENDERING ON INDEPENDENT WORKSTATION:  Three-dimensional MR images were rendered by post-processing of the original MR data on an independent workstation.  The three- dimensional MR images were interpreted, and findings were reported in the accompanying complete MRI report for this study.  BI-RADS CATEGORY 6:  Known biopsy-proven malignancy - appropriate action should be taken.  Recommendation:  Treatment plan  Original Report Authenticated By: Harrel Lemon, M.D.   Mm Breast Stereo Biopsy Right  11/23/2011  *RADIOLOGY REPORT*  Clinical Data:  61 year old female with suspicious nodule in the upper right breast - for tissue sampling.   STEREOTACTIC-GUIDED VACUUM ASSISTED BIOPSY OF THE RIGHT BREAST  I met with the patient and we discussed the procedure of stereotactic-guided biopsy, including benefits and alternatives. We discussed the high likelihood of a successful procedure. We discussed the risks of the procedure, including infection, bleeding, tissue injury, clip migration, and inadequate sampling. Informed, written consent was given.  Using sterile technique, 2% lidocaine, stereotactic guidance, and a 9 gauge vacuum assisted device, biopsy was performed of the 5 mm nodular density in the upper right breast.  At the conclusion of the procedure, a tissue marker clip was deployed into the biopsy cavity.  Follow-up 2-view mammogram confirmed clip placement to be satisfactory.  IMPRESSION: Stereotactic-guided biopsy of right breast nodular density.  No apparent complications.  Final pathology demonstrates GRADE 1 INVASIVE DUCTAL CARCINOMA. Histology correlates with imaging findings.  The patient was contacted by phone on 11/23/2011 and these results given to her which she understood. Her questions were answered. The patient had no complaints with her biopsy site. She was [facility].  Recommend surgery/oncology consultation.  An appointment at the Multidisciplinary Clinic has been scheduled for 12/01/2011. Recommend bilateral breast MRI, which has been scheduled for 11/26/2011.  Original Report Authenticated By: Rosendo Gros, M.D.   Mm Digital Diagnostic Unilat R  11/15/2011  **ADDENDUM** CREATED: 11/15/2011 14:50:20  The patient's mammographic images were reviewed.  The mass in the medial aspect of the right breast likely corresponds with the mass seen in the superior aspect of the breast.  Instead of MRI it was decided that a stereotactic biopsy could be performed in the CC projection.  The patient was contacted and made aware of the findings.  A stereotactic biopsy will be scheduled at  her convenience.  Addended by:  Littie Deeds. Judyann Munson, M.D. on 11/15/2011 14:50:20.  **END ADDENDUM** SIGNED BY: Littie Deeds. Judyann Munson, M.D.    11/05/2011  *RADIOLOGY REPORT*  Clinical Data:  Abnormal right screening mammogram  DIGITAL DIAGNOSTIC RIGHT MAMMOGRAM WITH CAD AND RIGHT BREAST ULTRASOUND:  Comparison:  With prior mammograms dating back to 08/04/2000.  Findings:  There are scattered fibroglandular densities.  There is persistence of a 6 mm nodule far posteriorly and medial on the CC view. There is a 6 mm nodule in the inferior aspect of the right breast and an obscured 6 mm nodule in the superior aspect of the right breast.  It is felt that the superior lesion likely correlates with the lesion seen on the CC view.  There are no malignant-type microcalcifications. Mammographic images were processed with CAD.  On physical exam, I do not palpate a discrete mass in the right breast.  Ultrasound is performed,  showing there is an anechoic lesion in the right breast at 5 o'clock 3 cm from the nipple measuring 4 x 4 x 4 mm.  There is increased through transmission.  No additional lesions are seen in the right breast.  There is no suspicious mass, abnormal shadowing or distortion seen in the superior aspect of the right breast.  IMPRESSION: Further evaluation of the right breast nodularity with MRI is recommended.  If the MRI is negative I would recommend short-term interval follow-up right mammogram in 6 months.  BI-RADS CATEGORY 0:  Incomplete.  Need additional imaging evaluation and/or prior mammograms for comparison.  Original Report Authenticated By: Littie Deeds. Judyann Munson, M.D.      IMPRESSION: T1N0 right breast cancer   PLAN: Elizabeth Berg is an excellent candidate for breast conservation. We discussed the equivalency in terms of survival between mastectomy and lumpectomy with radiation. We discussed the process of simulation and the placement of tattoos. We discussed the possible risk and benefits of treatment including but  not limited to skin redness fatigue and damage to critical structures including her lungs. We discussed between 4 and 6 weeks of radiation as an outpatient. We discussed that radiation would start 2-4 weeks after her surgery. I will plan on seeing her back after Dr. Ezzard Standing has released her. She met with Dr. Welton Flakes regarding antiestrogen therapy as well as with a member of our patient family support staff, a physical therapist and our breast cancer navigator. I spent 60 minutes minutes face to face with the patient and more than 50% of that time was spent in counseling and/or coordination of care.   ------------------------------------------------  Lurline Hare, MD

## 2011-12-01 NOTE — Progress Notes (Signed)
 Re:   Elizabeth Berg DOB:   04/05/1951 MRN:   [id number]  [facility]  ASSESSMENT AND PLAN: 1.  Right breast cancer.  T1a, N0.  1.4 cm on MRI, 5 mm on mammogram.  ER - 100, PR - 16,. Ki67 - 19%, Her2Neu - neg  Drs. Khan and Wentworth are treating oncologist.  I discussed the options for breast cancer treatment with the patient.  I discussed the idea of a multidisciplinary approach to the treatment of breast cancer (she's in the [facility]), which includes medical oncology and radiation oncology.  I discussed the surgical options of lumpectomy vs. mastectomy.  I discussed the options of lymph node biopsy.  The treatment plan depends on the pathologic staging of the tumor and the patient's personal wishes.  The risks of surgery include, but are not limited to, bleeding, infection, the need for further surgery, and nerve injury.  She is a candidate for right breast lumpectomy and right axillary SLNBx.   Plan: Right breast needle loc lumpectomy and right axillary SLNBx. Followed by rad tx and anti-estrogen therapy.  2.  Thyroid replacement. 3.  History of palpitations. 4.  Hypertension. 5.  Hypercholesterolemia. 6.  GERD.  REFERRING PHYSICIAN:   Dr. B. McKenzie  HISTORY OF PRESENT ILLNESS: Elizabeth Berg is a 61 y.o. (DOB: 07/10/1951)  white female whose primary care physician is Dr. B. McKenzie and comes to [facility] for a right breast cancer.  She is somewhat reserved.  She had her last mammogram in January 2012.  She got a screening mammogram at Dr. M. Anderson's office which showed a mass in her right breast and this prompted further exams.  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  She had a MRI on 11/26/2011 which showed a 1.4 x 1.2 cm area in the right breast.    She is accompanied with her husband and her sister.  She has no family history of breast cancer.  She has no history of hormone replacement.  She went through menopause about 5 years  ago.   Past Medical History  Diagnosis Date  . H/O colonoscopy 04/14/09  . H/O bone density study   . Wears glasses   . Heart burn   . Breast cancer     Past Surgical History  Procedure Date  . Cholecystectomy   . Thyroid surgery     Current Outpatient Prescriptions  Medication Sig Dispense Refill  . levothyroxine (SYNTHROID, LEVOTHROID) 137 MCG tablet Take 137 mcg by mouth daily.      . lisinopril (PRINIVIL,ZESTRIL) 10 MG tablet Take 10 mg by mouth daily.      . omeprazole (PRILOSEC) 20 MG capsule Take 20 mg by mouth daily.      . rosuvastatin (CRESTOR) 10 MG tablet Take 10 mg by mouth daily.      . verapamil (CALAN) 120 MG tablet Take 120 mg by mouth daily.         Allergies  Allergen Reactions  . Penicillins     REVIEW OF SYSTEMS: Skin:  No history of rash.  No history of abnormal moles. Infection:  No history of hepatitis or HIV.  No history of MRSA. Neurologic:  No history of stroke.  No history of seizure.  No history of headaches. Cardiac:  Sees Dr. Croitoru, [facility], for hypertension and palpitations. Pulmonary:  Does not smoke cigarettes.  No asthma or bronchitis.  No OSA/CPAP.  Endocrine:  No diabetes. Sees   Dr. Balan for thyroid replacement.  Had a total thyroidectomy by Dr. Gerkin in 2008. Gastrointestinal:  Has reflux disease and had an EGD last in 2010.  Mother had esophageal ca.  No history of liver disease.  Had lap chole by Dr. Blievernicht in the 1990's.  No history of pancreas disease.  Colonoscopy by Dr. Mann 2010. Urologic:  No history of kidney stones.  No history of bladder infections. GYN:  She sees Dr. Mark Anderson. Musculoskeletal:  No history of joint or back disease. Hematologic:  No bleeding disorder.  No history of anemia.  Not anticoagulated. Psycho-social:  The patient is oriented.   The patient has no obvious psychologic or social impairment to understanding our conversation and plan.  SOCIAL and FAMILY HISTORY: Married.  Husband  is with her.  Also her sister, Terry Beaver, is with her. 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 She has two sons - 34 and 37.  PHYSICAL EXAM: There were no vitals taken for this visit.  General: WN WF who is alert and generally healthy appearing.  HEENT: Normal. Pupils equal. Good dentition. Neck: Supple. No mass.  No thyroid mass. Lymph Nodes:  No supraclavicular, cervical nodes, or axillary nodes. Breast:  Right:  Bruise at 12 o'clock.  No palpable mass though she has some tenderness  Left:  No mass or area of concern. Lungs: Clear to auscultation and symmetric breath sounds.  Heart:  RRR. No murmur or rub. Abdomen: Soft. No mass. No tenderness. No hernia. Normal bowel sounds. Has scars from lap chole. Rectal: Not done. Extremities:  Good strength and ROM  in upper and lower extremities. Neurologic:  Grossly intact to motor and sensory function. Psychiatric: Has normal mood and affect. Behavior is normal.   DATA REVIEWED: Mammogram, MRI, and path report.  Path report was given to the patient.  Mazey Mantell, MD,  FACS [facility] [address]  [address]   [place], [place]    [address] Phone:  [phone] FAX:  [phone]  

## 2011-12-01 NOTE — Progress Notes (Signed)
Elizabeth Berg [id number] 1951/04/20 61 y.o. 12/01/2011 10:51 AM  CC Dr. Ovidio Kin Dr. Lurline Hare Dr. Thayer Headings Dr. Malva Limes Dr. 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 Patient is seen in the multidisciplinary breast clinic for discussion of treatment options. STAGE:   Cancer of lower-inner quadrant of female breast   Primary site: Breast   Staging method: AJCC 7th Edition   Clinical: (T1a, NX, cM0)   Summary: (T1a, NX, cM0)  REFERRING PHYSICIAN: Dr. Ovidio Kin  HISTORY OF PRESENT ILLNESS:  Elizabeth Berg is a 61 y.o. female With past medical history significant for hypertension hyperlipidemia and hypothyroidism. Patient recently presented for a screening mammogram and she was found to have a 5 mm area of concern in the right breast. She went on to have an ultrasound and a biopsy performed that showed invasive ductal carcinoma that was ER +100% PR +37% Ki-67 9% and HER-2/neu negative. She had an MRI of the breasts performed that confirmed the area to be about 1.4 cm. She is now seen in the multidisciplinary breast clinic for discussion of her treatment options. She has no prior history of breast cancer. She is otherwise without any complaints.   Past Medical History: Past Medical History  Diagnosis Date  . H/O colonoscopy 04/14/09  . H/O bone density study   . Wears glasses   . Heart burn   . Breast cancer     Past Surgical History: Past Surgical History  Procedure Date  . Cholecystectomy   . Thyroid surgery     Family History: Family History  Problem Relation Age of Onset  . Esophageal cancer Mother   . Rectal cancer Brother     Social History Patient is married she has 2 sons ages 73 and 38 she is a Designer, industrial/product. She does not smoke does not drink. History  Substance Use Topics  . Smoking status: Never Smoker   . Smokeless tobacco: Never Used  .  Alcohol Use: No    Allergies: Allergies  Allergen Reactions  . Penicillins     Current Medications: Current Outpatient Prescriptions  Medication Sig Dispense Refill  . levothyroxine (SYNTHROID, LEVOTHROID) 137 MCG tablet Take 137 mcg by mouth daily.      Marland Kitchen lisinopril (PRINIVIL,ZESTRIL) 10 MG tablet Take 10 mg by mouth daily.      Marland Kitchen omeprazole (PRILOSEC) 20 MG capsule Take 20 mg by mouth daily.      . rosuvastatin (CRESTOR) 10 MG tablet Take 10 mg by mouth daily.      . verapamil (CALAN) 120 MG tablet Take 120 mg by mouth daily.        OB/GYN History: Menarche at age 64 underwent menopause in 2008 she has never been on hormone replacement therapy. She has had 2 live births first live birth was at 14.  Fertility Discussion: He shouldn't has completed her family. 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  Health Maintenance: Patient has had her colonoscopy as well as bone densities and Pap smears up to date. She began having screening mammograms starting at the age of 72.  ECOG PERFORMANCE STATUS: 0 - Asymptomatic  Genetic Counseling/testing: Patient and I discussed genetic counseling and testing. Her brother did have rectal cancer at the age of 31 and died subsequently at 13. Patient's mother had esophageal carcinoma at an early age in  her 63s but she did have history of reflux. There is no other family history of early onset breast cancers colon cancers or prostate cancers. We therefore at this time have not scheduled her for genetic counseling or testing. But she is gong to look into her family history further and if she finds that there is significant family history then she will contact us up for possible genetic counseling. I will also keep our medical record up-to-date on her family history.  REVIEW OF SYSTEMS:  A comprehensive review of systems was negative.  PHYSICAL EXAMINATION: Blood pressure  134/84, pulse 73, temperature 98.6 F (37 C), temperature source Oral, height 5' 3.5" (1.613 m), weight 191 lb 1.6 oz (86.682 kg).  ZOX:WRUEA, healthy and no distress SKIN: skin color, texture, turgor are normal HEAD: Normocephalic EYES: PERRLA, EOMI, sclera clear EARS: External ears normal OROPHARYNX:no exudate, no erythema and lips, buccal mucosa, and tongue normal  NECK: no adenopathy LYMPH:  no palpable lymphadenopathy, no hepatosplenomegaly BREAST:Bilateral breasts are examined right breast does reveal area of ecchymosis and a palpable area likely hematoma otherwise no other masses or nipple discharge. Left breast no masses or nipple discharge. LUNGS: clear to auscultation and percussion HEART: regular rate & rhythm, no murmurs and no gallops ABDOMEN:abdomen soft, non-tender, normal bowel sounds and no masses or organomegaly BACK: No CVA tenderness EXTREMITIES:no edema, no clubbing, no cyanosis  NEURO: alert & oriented x 3 with fluent speech, no focal motor/sensory deficits, gait normal, reflexes normal and symmetric   STUDIES/RESULTS: US Breast Right  11/15/2011  **ADDENDUM** CREATED: 11/15/2011 14:50:20  The patient's mammographic images were reviewed.  The mass in the medial aspect of the right breast likely corresponds with the mass seen in the superior aspect of the breast.  Instead of MRI it was decided that a stereotactic biopsy could be performed in the CC projection.  The patient was contacted and made aware of the findings.  A stereotactic biopsy will be scheduled at her convenience.  Addended by:  Littie Deeds. Judyann Munson, M.D. on 11/15/2011 14:50:20.  **END ADDENDUM** SIGNED BY: Littie Deeds. Judyann Munson, M.D.    11/05/2011  *RADIOLOGY REPORT*  Clinical Data:  Abnormal right screening mammogram  DIGITAL DIAGNOSTIC RIGHT MAMMOGRAM WITH CAD AND RIGHT BREAST ULTRASOUND:  Comparison:  With prior mammograms dating back to 08/04/2000.  Findings:  There are scattered fibroglandular densities.  There is  persistence of a 6 mm nodule far posteriorly and medial on the CC view. There is a 6 mm nodule in the inferior aspect of the right breast and an obscured 6 mm nodule in the superior aspect of the right breast.  It is felt that the superior lesion likely correlates with the lesion seen on the CC view.  There are no malignant-type microcalcifications. Mammographic images were processed with CAD.  On physical exam, I do not palpate a discrete mass in the right breast.  Ultrasound is performed, showing there is an anechoic lesion in the right breast at 5 o'clock 3 cm from the nipple measuring 4 x 4 x 4 mm.  There is increased through transmission.  No additional lesions are seen in the right breast.  There is no suspicious mass, abnormal shadowing or distortion seen in the superior aspect of the right breast.  IMPRESSION: Further evaluation of the right breast nodularity with MRI is recommended.  If the MRI is negative I would recommend short-term interval follow-up right mammogram in 6 months.  BI-RADS CATEGORY 0:  Incomplete.  Need additional imaging evaluation  and/or prior mammograms for comparison.  Original Report Authenticated By: Littie Deeds. Judyann Munson, M.D.   Mr Breast Bilateral W Wo Contrast  11/26/2011  *RADIOLOGY REPORT*  Clinical Data: Recently diagnosed right breast upper inner quadrant invasive mammary carcinoma manifesting as a mammographically evident mass  BUN and creatinine were obtained on site at [facility] at [address] Results:  BUN 17 mg/dL,  Creatinine 0.9 mg/dL.  BILATERAL BREAST MRI WITH AND WITHOUT CONTRAST  Technique: Multiplanar, multisequence MR images of both breasts were obtained prior to and following the intravenous administration of 17ml of Multihance.  Three dimensional images were evaluated at the independent DynaCad workstation.  Comparison:  Prior mammogram and ultrasound  Findings: There is minimally irregular nodular peripheral rim enhancement at the site of right  upper inner quadrant biopsy cavity/clip artifact in the area of biopsy-proven breast cancer. This area measures 1.4 x 1.2 x 0.9 cm.  The remaining peripheral nodular component demonstrates plateau type enhancement kinetics. No other area of abnormal enhancement is seen in either breast.  No lymphadenopathy.  No abnormal T2-weighted hyperintensity. Minimal background parenchymal type enhancement pattern.  IMPRESSION: Minimal residual irregular nodularity at the site of the right upper quadrant to biopsy-proven breast cancer, with associated clip artifact.  No MRI evidence for multicentric/multifocal or contralateral malignancy.  THREE-DIMENSIONAL MR IMAGE RENDERING ON INDEPENDENT WORKSTATION:  Three-dimensional MR images were rendered by post-processing of the original MR data on an independent workstation.  The three- dimensional MR images were interpreted, and findings were reported in the accompanying complete MRI report for this study.  BI-RADS CATEGORY 6:  Known biopsy-proven malignancy - appropriate action should be taken.  Recommendation:  Treatment plan  Original Report Authenticated By: Harrel Lemon, M.D.   Mm Breast Stereo Biopsy Right  11/23/2011  *RADIOLOGY REPORT*  Clinical Data:  62 year old female with suspicious nodule in the upper right breast - for tissue sampling.  STEREOTACTIC-GUIDED VACUUM ASSISTED BIOPSY OF THE RIGHT BREAST  I met with the patient and we discussed the procedure of stereotactic-guided biopsy, including benefits and alternatives. We discussed the high likelihood of a successful procedure. We discussed the risks of the procedure, including infection, bleeding, tissue injury, clip migration, and inadequate sampling. Informed, written consent was given.  Using sterile technique, 2% lidocaine, stereotactic guidance, and a 9 gauge vacuum assisted device, biopsy was performed of the 5 mm nodular density in the upper right breast.  At the conclusion of the procedure, a tissue  marker clip was deployed into the biopsy cavity.  Follow-up 2-view mammogram confirmed clip placement to be satisfactory.  IMPRESSION: Stereotactic-guided biopsy of right breast nodular density.  No apparent complications.  Final pathology demonstrates GRADE 1 INVASIVE DUCTAL CARCINOMA. Histology correlates with imaging findings.  The patient was contacted by phone on 11/23/2011 and these results given to her which she understood. Her questions were answered. The patient had no complaints with her biopsy site. She was encouraged to return to the Breast Center for educational material.  Recommend surgery/oncology consultation.  An appointment at the Multidisciplinary Clinic has been scheduled for 12/01/2011. Recommend bilateral breast MRI, which has been scheduled for 11/26/2011.  Original Report Authenticated By: Rosendo Gros, M.D.   Mm Digital Diagnostic Unilat R  11/15/2011  **ADDENDUM** CREATED: 11/15/2011 14:50:20  The patient's mammographic images were reviewed.  The mass in the medial aspect of the right breast likely corresponds with the mass seen in the superior aspect of the breast.  Instead of MRI it was decided  that a stereotactic biopsy could be performed in the CC projection.  The patient was contacted and made aware of the findings.  A stereotactic biopsy will be scheduled at her convenience.  Addended by:  Littie Deeds. Judyann Munson, M.D. on 11/15/2011 14:50:20.  **END ADDENDUM** SIGNED BY: Littie Deeds. Judyann Munson, M.D.    11/05/2011  *RADIOLOGY REPORT*  Clinical Data:  Abnormal right screening mammogram  DIGITAL DIAGNOSTIC RIGHT MAMMOGRAM WITH CAD AND RIGHT BREAST ULTRASOUND:  Comparison:  With prior mammograms dating back to 08/04/2000.  Findings:  There are scattered fibroglandular densities.  There is persistence of a 6 mm nodule far posteriorly and medial on the CC view. There is a 6 mm nodule in the inferior aspect of the right breast and an obscured 6 mm nodule in the superior aspect of the right breast.  It is  felt that the superior lesion likely correlates with the lesion seen on the CC view.  There are no malignant-type microcalcifications. Mammographic images were processed with CAD.  On physical exam, I do not palpate a discrete mass in the right breast.  Ultrasound is performed, showing there is an anechoic lesion in the right breast at 5 o'clock 3 cm from the nipple measuring 4 x 4 x 4 mm.  There is increased through transmission.  No additional lesions are seen in the right breast.  There is no suspicious mass, abnormal shadowing or distortion seen in the superior aspect of the right breast.  IMPRESSION: Further evaluation of the right breast nodularity with MRI is recommended.  If the MRI is negative I would recommend short-term interval follow-up right mammogram in 6 months.  BI-RADS CATEGORY 0:  Incomplete.  Need additional imaging evaluation and/or prior mammograms for comparison.  Original Report Authenticated By: Littie Deeds. Judyann Munson, M.D.     LABS:    Chemistry      Component Value Date/Time   NA 138 12/01/2011 0755   K 4.4 12/01/2011 0755   CL 99 12/01/2011 0755   CO2 30 12/01/2011 0755   BUN 22 12/01/2011 0755   CREATININE 1.01 12/01/2011 0755      Component Value Date/Time   CALCIUM 9.6 12/01/2011 0755   ALKPHOS 67 12/01/2011 0755   AST 18 12/01/2011 0755   ALT 17 12/01/2011 0755   BILITOT 0.5 12/01/2011 0755      Lab Results  Component Value Date   WBC 5.6 12/01/2011   HGB 14.7 12/01/2011   HCT 43.7 12/01/2011   MCV 87.4 12/01/2011   PLT 232 12/01/2011       PATHOLOGY: PROGNOSTIC INDICATORS - ACIS Results IMMUNOHISTOCHEMICAL AND MORPHOMETRIC ANALYSIS BY THE AUTOMATED CELLULAR IMAGING SYSTEM (ACIS) Estrogen Receptor (Negative, <1%): 100%,POSITIVE, STRONG STAINING INTENSITY Progesterone Receptor (Negative, <1%): 37%,POSITIVE, STRONG STAINING INTENSITY Proliferation Marker Ki67 by M IB-1 (Low<20%): 9% All controls stained appropriately Abigail Miyamoto MD Pathologist, Electronic  Signature ( Signed 11/26/2011) CHROMOGENIC IN-SITU HYBRIDIZATION Interpretation HER-2/NEU BY CISH - NO AMPLIFICATION OF HER-2 DETECTED. THE RATIO OF HER-2: CEP 17 SIGNALS WAS 1.23. Reference range: Ratio: HER2:CEP17 < 1.8 - gene amplification not observed Ratio: HER2:CEP 17 1.8-2.2 - equivocal result Ratio: HER2:CEP17 > 2.2 - gene amplification observed H.FINAL DIAGNOSIS Diagnosis Breast, right, needle core biopsy, upper - INVASIVE DUCTAL CARCINOMA SEE COMMENT. - MICROCALCIFICATIONS IDENTIFIED. Microscopic Comment Although the grade of tumor is best assessed at resection, with these biopsies, the invasive tumor is grade I. Breast prognostic studies are pending and will be reported in an addendum. 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 The tumor measured 1.4 cm by MRI that was done on 11/26/2011. Patient's tumor was ER +100% PR +37% HER-2/neu negative with a proliferation marker Ki-67 low at 9%. Patient is seen in the multidisciplinary breast clinic for discussion of treatment options. Today the patient and I and her family went through her pathology and the rationale for our recommendations in great detail. It is planned that patient will have a lumpectomy with sentinel node biopsy. Post lumpectomy she will need radiation therapy. I will treat her with adjuvant antiestrogen therapy following NCCN guidelines For small ER positive breast cancer. We discussed the role of tamoxifen as well as aromatase inhibitors. We discussed risks and benefits of these.    PLAN:    #1 patient will be scheduled to have her lumpectomy and sentinel node biopsy by Dr. Ovidio Kin.  #2 once patient has her surgery I will plan on seeing her back to discuss her final pathology. If it continues to be consistent with what we have now she will be referred to Dr. Lurline Hare for consideration of radiation  therapy.  #3 once patient finishes her radiation then she will begin adjuvant antiestrogen therapy with an aromatase inhibitor. Again we discussed the side effects of these treatments. She certainly has a lot of options including letrozole Aromasin and anastrozole as well as tamoxifen.  #4 I will plan on seeing the patient back after her surgery hopefully in the next 4-5 weeks time.       Thank you so much for allowing me to participate in the care of Elizabeth Berg. I will continue to follow up the patient with you and assist in her care.  All questions were answered. The patient knows to call the clinic with any problems, questions or concerns. We can certainly see the patient much sooner if necessary.  I spent 55 minutes counseling the patient face to face. The total time spent in the appointment was 55 minutes. Drue Second, MD Medical/Oncology [address] [facility] [phone] (beeper) [phone] (Office)  12/01/2011, 12:05 PM  12/01/2011, 10:51 AM

## 2011-12-01 NOTE — Progress Notes (Signed)
I met the patient and her husband in today's multidisciplinary breast clinic and reviewed her distress screening.  She rated her stress as a "5".  She was tearful and expressed feeling overwhelmed.  I encouraged her to attend the Breast Cancer Support Group, and she did ask me to make a referral for her to [facility].

## 2011-12-01 NOTE — Patient Instructions (Signed)
I will see you back in 4-5 weeks time

## 2011-12-06 ENCOUNTER — Telehealth: Payer: Self-pay | Admitting: *Deleted

## 2011-12-06 ENCOUNTER — Encounter (HOSPITAL_COMMUNITY): Payer: Self-pay | Admitting: Pharmacy Technician

## 2011-12-06 NOTE — Telephone Encounter (Signed)
Spoke with patient to briefly discuss Navigators role.  Reports has strong support systems in place and has strong faith.  Pt. Tearful as it was explained she could call me at anytime to vent/express concerns/feelings.  Support group and other activities offered at the [facility].  Pt. is aware of upcoming appointments.  Pt. verbalized understanding and agreed. Contact information provided.

## 2011-12-10 ENCOUNTER — Encounter: Payer: Self-pay | Admitting: *Deleted

## 2011-12-10 ENCOUNTER — Encounter (HOSPITAL_COMMUNITY): Payer: Self-pay

## 2011-12-10 ENCOUNTER — Inpatient Hospital Stay (HOSPITAL_COMMUNITY): Admission: RE | Admit: 2011-12-10 | Discharge: 2011-12-10 | Payer: BC Managed Care – PPO | Source: Ambulatory Visit

## 2011-12-10 HISTORY — DX: Essential (primary) hypertension: I10

## 2011-12-10 NOTE — Pre-Procedure Instructions (Signed)
20 Elizabeth Berg  12/10/2011   Your procedure is scheduled on:12-20-2010 @12 :30 PM  Report to [facility] Short Stay Center at10:30 AM.  Call this number if you have problems the morning of surgery: [phone]   Remember:   Do not eat food:After Midnight.  May have clear liquids: up to 4 Hours before arrival.Until 6:30 AM  Clear liquids include soda, tea, black coffee, apple or grape juice, broth.  Take these medicines the morning of surgery with A SIP OF WATER levothyoxine,prilsec,verapamil   Do not wear jewelry, make-up or nail polish.  Do not wear lotions, powders, or perfumes. You may wear deodorant.  Do not shave 48 hours prior to surgery. Men may shave face and neck.  Do not bring valuables to the hospital.  Contacts, dentures or bridgework may not be worn into surgery.  Leave suitcase in the car. After surgery it may be brought to your room.  For patients admitted to the hospital, checkout time is 11:00 AM the day of discharge.   Patients discharged the day of surgery will not be allowed to drive home.  Name and phone number of your driver:  Special Instructions: CHG Shower Use Special Wash: 1/2 bottle night before surgery and 1/2 bottle morning of surgery.   Please read over the following fact sheets that you were given: Pain Booklet, Coughing and Deep Breathing, MRSA Information and Surgical Site Infection Prevention

## 2011-12-10 NOTE — Progress Notes (Signed)
Mailed after appt letter to pt. 

## 2011-12-15 ENCOUNTER — Encounter (HOSPITAL_COMMUNITY)
Admission: RE | Admit: 2011-12-15 | Discharge: 2011-12-15 | Disposition: A | Discharge status: Home / Self Care | Payer: BC Managed Care – PPO | Source: Ambulatory Visit | Attending: Surgery | Admitting: Surgery

## 2011-12-15 LAB — CBC
HCT: 42 % (ref 36.0–46.0)
Hemoglobin: 14.2 g/dL (ref 12.0–15.0)
MCHC: 33.8 g/dL (ref 30.0–36.0)
RBC: 4.87 MIL/uL (ref 3.87–5.11)
WBC: 6.4 10*3/uL (ref 4.0–10.5)

## 2011-12-15 LAB — BASIC METABOLIC PANEL
BUN: 15 mg/dL (ref 6–23)
Chloride: 99 mEq/L (ref 96–112)
GFR calc Af Amer: >90 mL/min (ref >90)
Potassium: 4.2 mEq/L (ref 3.5–5.1)

## 2011-12-15 LAB — SURGICAL PCR SCREEN: Staphylococcus aureus: NEGATIVE

## 2011-12-15 MED ORDER — CHLORHEXIDINE GLUCONATE 4 % EX LIQD: 1.0000 "application " | Freq: Once | CUTANEOUS | Status: DC

## 2011-12-16 ENCOUNTER — Encounter: Payer: Self-pay | Admitting: *Deleted

## 2011-12-16 NOTE — Progress Notes (Signed)
Spoke with patient to address needs/concerns before surgery.  None verbalized.  June appointments reviewed . Reports if additional support is needed, she will contact Cancer center programs.  She understands to call with questions.

## 2011-12-20 ENCOUNTER — Ambulatory Visit
Admission: RE | Admit: 2011-12-20 | Discharge: 2011-12-20 | Disposition: A | Discharge status: Home / Self Care | Payer: BC Managed Care – PPO | Source: Ambulatory Visit | Attending: Surgery | Admitting: Surgery

## 2011-12-20 ENCOUNTER — Encounter (HOSPITAL_COMMUNITY): Payer: Self-pay | Admitting: Anesthesiology

## 2011-12-20 ENCOUNTER — Ambulatory Visit (HOSPITAL_COMMUNITY): Payer: BC Managed Care – PPO | Admitting: Anesthesiology

## 2011-12-20 ENCOUNTER — Ambulatory Visit (HOSPITAL_COMMUNITY)
Admission: RE | Admit: 2011-12-20 | Discharge: 2011-12-20 | Disposition: A | Discharge status: Home / Self Care | Payer: BC Managed Care – PPO | Source: Ambulatory Visit | Attending: Surgery | Admitting: Surgery

## 2011-12-20 ENCOUNTER — Other Ambulatory Visit (HOSPITAL_COMMUNITY): Payer: Self-pay | Admitting: Surgery

## 2011-12-20 ENCOUNTER — Encounter (HOSPITAL_COMMUNITY): Payer: Self-pay | Admitting: Surgery

## 2011-12-20 ENCOUNTER — Encounter (HOSPITAL_COMMUNITY)
Admission: RE | Disposition: A | Discharge status: Home / Self Care | Payer: Self-pay | Source: Ambulatory Visit | Attending: Surgery

## 2011-12-20 DIAGNOSIS — C50919 Malignant neoplasm of unspecified site of unspecified female breast: Secondary | ICD-10-CM

## 2011-12-20 DIAGNOSIS — E7800 Pure hypercholesterolemia, unspecified: Secondary | ICD-10-CM | POA: Insufficient documentation

## 2011-12-20 DIAGNOSIS — K219 Gastro-esophageal reflux disease without esophagitis: Secondary | ICD-10-CM | POA: Insufficient documentation

## 2011-12-20 DIAGNOSIS — I10 Essential (primary) hypertension: Secondary | ICD-10-CM | POA: Insufficient documentation

## 2011-12-20 DIAGNOSIS — C50319 Malignant neoplasm of lower-inner quadrant of unspecified female breast: Secondary | ICD-10-CM

## 2011-12-20 HISTORY — PX: BREAST LUMPECTOMY: SHX2

## 2011-12-20 SURGERY — BREAST BIOPSY WITH NEEDLE LOCALIZATION
Anesthesia: General | Laterality: Right | Wound class: Clean

## 2011-12-20 MED ORDER — PROPOFOL 10 MG/ML IV BOLUS
INTRAVENOUS | Status: DC | PRN
  Administered 2011-12-20: 150 mg via INTRAVENOUS

## 2011-12-20 MED ORDER — MIDAZOLAM HCL 5 MG/5ML IJ SOLN
INTRAMUSCULAR | Status: DC | PRN
  Administered 2011-12-20: 2 mg via INTRAVENOUS

## 2011-12-20 MED ORDER — LACTATED RINGERS IV SOLN
INTRAVENOUS | Status: DC | PRN
  Administered 2011-12-20: 12:00:00 via INTRAVENOUS

## 2011-12-20 MED ORDER — SODIUM CHLORIDE 0.9 % IJ SOLN
INTRAMUSCULAR | Status: DC | PRN
  Administered 2011-12-20: 13:00:00 via INTRAMUSCULAR

## 2011-12-20 MED ORDER — ONDANSETRON HCL 4 MG/2ML IJ SOLN
INTRAMUSCULAR | Status: DC | PRN
  Administered 2011-12-20: 4 mg via INTRAVENOUS

## 2011-12-20 MED ORDER — FENTANYL CITRATE 0.05 MG/ML IJ SOLN
INTRAMUSCULAR | Status: AC
  Filled 2011-12-20: qty 2

## 2011-12-20 MED ORDER — HYDROCODONE-ACETAMINOPHEN 5-325 MG PO TABS: 1.0000 | ORAL_TABLET | Freq: Four times a day (QID) | ORAL | Status: AC | PRN

## 2011-12-20 MED ORDER — CEFAZOLIN SODIUM 1-5 GM-% IV SOLN
INTRAVENOUS | Status: AC
  Filled 2011-12-20: qty 50

## 2011-12-20 MED ORDER — TECHNETIUM TC 99M SULFUR COLLOID FILTERED
1.0000 | Freq: Once | INTRAVENOUS | Status: AC | PRN
  Administered 2011-12-20: 1 via INTRADERMAL

## 2011-12-20 MED ORDER — DEXAMETHASONE SODIUM PHOSPHATE 4 MG/ML IJ SOLN
INTRAMUSCULAR | Status: DC | PRN
  Administered 2011-12-20: 8 mg via INTRAVENOUS

## 2011-12-20 MED ORDER — LIDOCAINE HCL (CARDIAC) 20 MG/ML IV SOLN
INTRAVENOUS | Status: DC | PRN
  Administered 2011-12-20: 80 mg via INTRAVENOUS

## 2011-12-20 MED ORDER — LACTATED RINGERS IV SOLN: INTRAVENOUS | Status: DC

## 2011-12-20 MED ORDER — CEFAZOLIN SODIUM 1-5 GM-% IV SOLN: 1.0000 g | INTRAVENOUS | Status: DC

## 2011-12-20 MED ORDER — LACTATED RINGERS IV SOLN
INTRAVENOUS | Status: DC
  Administered 2011-12-20: 13:00:00 via INTRAVENOUS

## 2011-12-20 MED ORDER — BUPIVACAINE HCL 0.25 % IJ SOLN
INTRAMUSCULAR | Status: DC | PRN
  Administered 2011-12-20: 30 mL

## 2011-12-20 MED ORDER — FENTANYL CITRATE 0.05 MG/ML IJ SOLN
INTRAMUSCULAR | Status: DC | PRN
  Administered 2011-12-20 (×2): 25 ug via INTRAVENOUS
  Administered 2011-12-20 (×2): 50 ug via INTRAVENOUS
  Administered 2011-12-20: 25 ug via INTRAVENOUS

## 2011-12-20 MED ORDER — FENTANYL CITRATE 0.05 MG/ML IJ SOLN
25.0000 ug | INTRAMUSCULAR | Status: DC | PRN
  Administered 2011-12-20 (×2): 25 ug via INTRAVENOUS

## 2011-12-20 SURGICAL SUPPLY — 60 items
ADH SKN CLS APL DERMABOND .7 (GAUZE/BANDAGES/DRESSINGS) ×1
APL SKNCLS STERI-STRIP NONHPOA (GAUZE/BANDAGES/DRESSINGS) ×1
APPLIER CLIP 9.375 MED OPEN (MISCELLANEOUS) ×2
APR CLP MED 9.3 20 MLT OPN (MISCELLANEOUS) ×1
BENZOIN TINCTURE PRP APPL 2/3 (GAUZE/BANDAGES/DRESSINGS) ×2 IMPLANT
BINDER BREAST LRG (GAUZE/BANDAGES/DRESSINGS) IMPLANT
BINDER BREAST XLRG (GAUZE/BANDAGES/DRESSINGS) ×2 IMPLANT
BLADE SURG 10 STRL SS (BLADE) ×2 IMPLANT
BLADE SURG 15 STRL LF DISP TIS (BLADE) ×1 IMPLANT
BLADE SURG 15 STRL SS (BLADE) ×2
CANISTER SUCTION 2500CC (MISCELLANEOUS) ×2 IMPLANT
CHLORAPREP W/TINT 10.5 ML (MISCELLANEOUS) ×2 IMPLANT
CHLORAPREP W/TINT 26ML (MISCELLANEOUS) ×2 IMPLANT
CLIP APPLIE 9.375 MED OPEN (MISCELLANEOUS) ×1 IMPLANT
CLOTH BEACON ORANGE TIMEOUT ST (SAFETY) ×2 IMPLANT
COVER SURGICAL LIGHT HANDLE (MISCELLANEOUS) ×2 IMPLANT
DECANTER SPIKE VIAL GLASS SM (MISCELLANEOUS) ×2 IMPLANT
DERMABOND ADVANCED (GAUZE/BANDAGES/DRESSINGS) ×1
DERMABOND ADVANCED .7 DNX12 (GAUZE/BANDAGES/DRESSINGS) ×1 IMPLANT
DEVICE DUBIN SPECIMEN MAMMOGRA (MISCELLANEOUS) ×2 IMPLANT
DRAPE LAPAROSCOPIC ABDOMINAL (DRAPES) ×2 IMPLANT
DRAPE PED LAPAROTOMY (DRAPES) ×2 IMPLANT
DRAPE PROXIMA HALF (DRAPES) ×4 IMPLANT
DRAPE UTILITY 15X26 W/TAPE STR (DRAPE) ×4 IMPLANT
ELECT CAUTERY BLADE 6.4 (BLADE) ×2 IMPLANT
ELECT COATED BLADE 2.86 ST (ELECTRODE) ×4 IMPLANT
ELECT REM PT RETURN 9FT ADLT (ELECTROSURGICAL) ×2
ELECTRODE REM PT RTRN 9FT ADLT (ELECTROSURGICAL) ×1 IMPLANT
GLOVE ECLIPSE 6.5 STRL STRAW (GLOVE) ×2 IMPLANT
GLOVE SURG SIGNA 7.5 PF LTX (GLOVE) ×4 IMPLANT
GOWN PREVENTION PLUS XLARGE (GOWN DISPOSABLE) ×2 IMPLANT
GOWN STRL NON-REIN LRG LVL3 (GOWN DISPOSABLE) ×2 IMPLANT
GOWN STRL REIN XL XLG (GOWN DISPOSABLE) IMPLANT
KIT BASIN OR (CUSTOM PROCEDURE TRAY) ×2 IMPLANT
KIT MARKER MARGIN INK (KITS) ×2 IMPLANT
KIT ROOM TURNOVER OR (KITS) ×2 IMPLANT
MARKER SKIN DUAL TIP RULER LAB (MISCELLANEOUS) ×2 IMPLANT
NEEDLE HYPO 25GX1X1/2 BEV (NEEDLE) ×2 IMPLANT
NS IRRIG 1000ML POUR BTL (IV SOLUTION) ×2 IMPLANT
PACK GENERAL/GYN (CUSTOM PROCEDURE TRAY) ×2 IMPLANT
PACK SURGICAL SETUP 50X90 (CUSTOM PROCEDURE TRAY) ×2 IMPLANT
PAD ARMBOARD 7.5X6 YLW CONV (MISCELLANEOUS) ×4 IMPLANT
PENCIL BUTTON HOLSTER BLD 10FT (ELECTRODE) ×2 IMPLANT
SPONGE GAUZE 4X4 12PLY (GAUZE/BANDAGES/DRESSINGS) ×2 IMPLANT
SPONGE LAP 18X18 X RAY DECT (DISPOSABLE) IMPLANT
SPONGE LAP 4X18 X RAY DECT (DISPOSABLE) IMPLANT
STAPLER VISISTAT 35W (STAPLE) ×2 IMPLANT
STRIP CLOSURE SKIN 1/4X4 (GAUZE/BANDAGES/DRESSINGS) ×2 IMPLANT
SUT MNCRL AB 4-0 PS2 18 (SUTURE) ×2 IMPLANT
SUT SILK 2 0 SH (SUTURE) ×2 IMPLANT
SUT VIC AB 3-0 SH 18 (SUTURE) ×2 IMPLANT
SUT VIC AB 5-0 PS2 18 (SUTURE) ×2 IMPLANT
SUT VICRYL AB 3 0 TIES (SUTURE) IMPLANT
SYR BULB 3OZ (MISCELLANEOUS) ×2 IMPLANT
SYR CONTROL 10ML LL (SYRINGE) ×2 IMPLANT
TOWEL OR 17X24 6PK STRL BLUE (TOWEL DISPOSABLE) ×2 IMPLANT
TOWEL OR 17X26 10 PK STRL BLUE (TOWEL DISPOSABLE) ×2 IMPLANT
TUBE CONNECTING 12X1/4 (SUCTIONS) IMPLANT
WATER STERILE IRR 1000ML POUR (IV SOLUTION) IMPLANT
YANKAUER SUCT BULB TIP NO VENT (SUCTIONS) IMPLANT

## 2011-12-20 NOTE — Brief Op Note (Signed)
12/20/2011  2:26 PM  PATIENT:  Elizabeth Berg, 61 y.o., female, MRN: [id number]  PREOP DIAGNOSIS:  Right Breast Cancer  POSTOP DIAGNOSIS:   Right breast cancer (T1a, N0), at 12 o'clock  PROCEDURE:   Procedure(s): right BREAST BIOPSY WITH NEEDLE LOCALIZATION, Injection of methylene in the right sub-areolar area, right AXILLARY sentinel LYMPH NODE BIOPSY  SURGEON:   Ovidio Kin, M.D.  ASSISTANT:   none  ANESTHESIA:   general  Gaetano Hawthorne, MD - Anesthesiologist Jeani Hawking, CRNA - CRNA Blima Rich Mumm, CRNA - CRNA  General  EBL:  < 50  ml  BLOOD ADMINISTERED: none  DRAINS: none   LOCAL MEDICATIONS USED:   30 cc 1/4% marcaine  SPECIMEN:   Right breast biopsy and right axillary sentinel lymph noee  COUNTS CORRECT:  YES  INDICATIONS FOR PROCEDURE:  Elizabeth Berg is a 61 y.o. (DOB: 1950-11-04) white female whose primary care physician is Thayer Headings, MD, MD and comes for right breast lumpectomy and right axillary sentinel lymph node biopsy.   The indications and risks of the surgery were explained to the patient.  The risks include, but are not limited to, infection, bleeding, and nerve injury.  Note dictated to:   [phone]

## 2011-12-20 NOTE — Progress Notes (Signed)
[place] in nuclear med notified of pt's arrival.

## 2011-12-20 NOTE — H&P (View-Only) (Signed)
Re:   Elizabeth Berg DOB:   1950-11-18 MRN:   [id number]  [facility]  ASSESSMENT AND PLAN: 1.  Right breast cancer.  T1a, N0.  1.4 cm on MRI, 5 mm on mammogram.  ER - 100, PR - 16,. Ki67 - 19%, Her2Neu - neg  Drs. Welton Flakes and Michell Heinrich are treating oncologist.  I discussed the options for breast cancer treatment with the patient.  I discussed the idea of a multidisciplinary approach to the treatment of breast cancer (she's in the [facility]), which includes medical oncology and radiation oncology.  I discussed the surgical options of lumpectomy vs. mastectomy.  I discussed the options of lymph node biopsy.  The treatment plan depends on the pathologic staging of the tumor and the patient's personal wishes.  The risks of surgery include, but are not limited to, bleeding, infection, the need for further surgery, and nerve injury.  She is a candidate for right breast lumpectomy and right axillary SLNBx.   Plan: Right breast needle loc lumpectomy and right axillary SLNBx. Followed by rad tx and anti-estrogen therapy.  2.  Thyroid replacement. 3.  History of palpitations. 4.  Hypertension. 5.  Hypercholesterolemia. 6.  GERD.  REFERRING PHYSICIAN:   Dr. Gillermina Phy  HISTORY OF PRESENT ILLNESS: Elizabeth Berg is a 61 y.o. (DOB: January 23, 1951)  white female whose primary care physician is Dr. Gillermina Phy and comes to [facility] for a right breast cancer.  She is somewhat reserved.  She had her last mammogram in January 2012.  She got a screening mammogram at Dr. Judie Petit. Anderson's office which showed a mass in her right breast and this prompted further exams.  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  She had a MRI on 11/26/2011 which showed a 1.4 x 1.2 cm area in the right breast.    She is accompanied with her husband and her sister.  She has no family history of breast cancer.  She has no history of hormone replacement.  She went through menopause about 5 years  ago.   Past Medical History  Diagnosis Date  . H/O colonoscopy 04/14/09  . H/O bone density study   . Wears glasses   . Heart burn   . Breast cancer     Past Surgical History  Procedure Date  . Cholecystectomy   . Thyroid surgery     Current Outpatient Prescriptions  Medication Sig Dispense Refill  . levothyroxine (SYNTHROID, LEVOTHROID) 137 MCG tablet Take 137 mcg by mouth daily.      Marland Kitchen lisinopril (PRINIVIL,ZESTRIL) 10 MG tablet Take 10 mg by mouth daily.      Marland Kitchen omeprazole (PRILOSEC) 20 MG capsule Take 20 mg by mouth daily.      . rosuvastatin (CRESTOR) 10 MG tablet Take 10 mg by mouth daily.      . verapamil (CALAN) 120 MG tablet Take 120 mg by mouth daily.         Allergies  Allergen Reactions  . Penicillins     REVIEW OF SYSTEMS: Skin:  No history of rash.  No history of abnormal moles. Infection:  No history of hepatitis or HIV.  No history of MRSA. Neurologic:  No history of stroke.  No history of seizure.  No history of headaches. Cardiac:  Sees Dr. Royann Shivers, [facility], for hypertension and palpitations. Pulmonary:  Does not smoke cigarettes.  No asthma or bronchitis.  No OSA/CPAP.  Endocrine:  No diabetes. Sees  Dr. Talmage Nap for thyroid replacement.  Had a total thyroidectomy by Dr. Gerrit Friends in 2008. Gastrointestinal:  Has reflux disease and had an EGD last in 2010.  Mother had esophageal ca.  No history of liver disease.  Had lap chole by Dr. Woodfin Ganja in the [phone].  No history of pancreas disease.  Colonoscopy by Dr. Loreta Ave 2010. Urologic:  No history of kidney stones.  No history of bladder infections. GYN:  She sees Dr. Malva Limes. Musculoskeletal:  No history of joint or back disease. Hematologic:  No bleeding disorder.  No history of anemia.  Not anticoagulated. Psycho-social:  The patient is oriented.   The patient has no obvious psychologic or social impairment to understanding our conversation and plan.  SOCIAL and FAMILY HISTORY: Married.  Husband  is with her.  Also her sister, Liberty Handy, is with her. She works with Psychologist, sport and exercise. She has two sons - 87 and 76.  PHYSICAL EXAM: There were no vitals taken for this visit.  General: WN WF who is alert and generally healthy appearing.  HEENT: Normal. Pupils equal. Good dentition. Neck: Supple. No mass.  No thyroid mass. Lymph Nodes:  No supraclavicular, cervical nodes, or axillary nodes. Breast:  Right:  Bruise at 12 o'clock.  No palpable mass though she has some tenderness  Left:  No mass or area of concern. Lungs: Clear to auscultation and symmetric breath sounds.  Heart:  RRR. No murmur or rub. Abdomen: Soft. No mass. No tenderness. No hernia. Normal bowel sounds. Has scars from lap chole. Rectal: Not done. Extremities:  Good strength and ROM  in upper and lower extremities. Neurologic:  Grossly intact to motor and sensory function. Psychiatric: Has normal mood and affect. Behavior is normal.   DATA REVIEWED: Mammogram, MRI, and path report.  Path report was given to the patient.  Ovidio Kin, MD,  [facility] [address]  [address]   [place], [place]    [address] Phone:  [phone] FAX:  [phone]

## 2011-12-20 NOTE — Progress Notes (Signed)
Dr. Ezzard Standing notified of pennicillin allergy.  SInce pt does not have anaphylactic reaction-Ancef order to be released per Dr. Allene Pyo order.//L. Daiel Strohecker,RN

## 2011-12-20 NOTE — Preoperative (Signed)
Beta Blockers   Reason not to administer Beta Blockers:Not Applicable 

## 2011-12-20 NOTE — Interval H&P Note (Signed)
History and Physical Interval Note:  12/20/2011 12:48 PM  Elizabeth Berg  has presented today for surgery, with the diagnosis of right breast cancer  The various methods of treatment have been discussed with the patient and family.   Her husband and sister are with her today.  After consideration of risks, benefits and other options for treatment, the patient has consented to  Procedure(s) (LRB):  BREAST BIOPSY WITH NEEDLE LOCALIZATION (Right) AXILLARY LYMPH NODE BIOPSY (Right) as a surgical intervention .    The patients' history has been reviewed, patient examined, no change in status, stable for surgery.  I have reviewed the patients' chart and labs.  Questions were answered to the patient's satisfaction.     [name]

## 2011-12-20 NOTE — Discharge Instructions (Signed)
[facility] - DISCHARGE INSTRUCTIONS TO PATIENT  Return to work on:  12/29/2011  Activity:  Driving - may drive in 1 to 2 days, if doing well.   Lifting - No lifting x 1 week.  Wound Care:   Leave bandage for 2 days, then may remove and shower.  Diet:  As tolerated  Follow up appointment:  Call Dr. Allene Pyo office [facility]) at [phone] for an appointment in 1 week.  Medications and dosages:  Resume your home medications.  You have a prescription for:  Vicodin.  Call Dr. Ezzard Standing or his office  [phone]) if you have:  Temperature greater than 100.4,  Persistent nausea and vomiting,  Severe uncontrolled pain,  Redness, tenderness, or signs of infection (pain, swelling, redness, odor or green/yellow discharge around the site),  Difficulty breathing, headache or visual disturbances,  Any other questions or concerns you may have after discharge.  In an emergency, call 911 or go to an Emergency Department at a nearby hospital.

## 2011-12-20 NOTE — Transfer of Care (Signed)
Immediate Anesthesia Transfer of Care Note  Patient: Elizabeth Berg  Procedure(s) Performed: Procedure(s) (LRB): BREAST BIOPSY WITH NEEDLE LOCALIZATION (Right) AXILLARY LYMPH NODE BIOPSY (Right)  Patient Location: PACU  Anesthesia Type: General  Level of Consciousness: awake, alert  and oriented  Airway & Oxygen Therapy: Patient Spontanous Breathing and Patient connected to nasal cannula oxygen  Post-op Assessment: Report given to PACU RN and Post -op Vital signs reviewed and stable  Post vital signs: Reviewed and stable  Complications: No apparent anesthesia complications

## 2011-12-20 NOTE — Anesthesia Preprocedure Evaluation (Addendum)
Anesthesia Evaluation  Patient identified by MRN, date of birth, ID band Patient awake    Reviewed: Allergy & Precautions, H&P , NPO status , Patient's Chart, lab work & pertinent test results  History of Anesthesia Complications Negative for: history of anesthetic complications  Airway Mallampati: II TM Distance: >3 FB Neck ROM: full    Dental No notable dental hx. (+) Teeth Intact and Dental Advisory Given   Pulmonary neg pulmonary ROS,  breath sounds clear to auscultation  Pulmonary exam normal       Cardiovascular Exercise Tolerance: Good hypertension, Pt. on medications negative cardio ROS  Rhythm:regular Rate:Normal     Neuro/Psych negative neurological ROS  negative psych ROS   GI/Hepatic negative GI ROS, Neg liver ROS, GERD-  Medicated and Controlled,  Endo/Other  negative endocrine ROS  Renal/GU negative Renal ROS  negative genitourinary   Musculoskeletal   Abdominal   Peds  Hematology negative hematology ROS (+)   Anesthesia Other Findings   Reproductive/Obstetrics negative OB ROS                         Anesthesia Physical Anesthesia Plan  ASA: II  Anesthesia Plan: General   Post-op Pain Management:    Induction: Intravenous  Airway Management Planned: LMA  Additional Equipment:   Intra-op Plan:   Post-operative Plan:   Informed Consent: I have reviewed the patients History and Physical, chart, labs and discussed the procedure including the risks, benefits and alternatives for the proposed anesthesia with the patient or authorized representative who has indicated his/her understanding and acceptance.   Dental Advisory Given  Plan Discussed with: CRNA and Surgeon  Anesthesia Plan Comments:         Anesthesia Quick Evaluation

## 2011-12-21 ENCOUNTER — Encounter (HOSPITAL_COMMUNITY): Payer: Self-pay | Admitting: Surgery

## 2011-12-21 NOTE — Op Note (Signed)
Elizabeth Berg, Elizabeth Berg           ACCOUNT NO.:  [id number]  MEDICAL RECORD NO.:  [id number]  LOCATION:  [facility]                         FACILITY:  [facility]  PHYSICIAN:  Sandria Bales. Ezzard Standing, M.D.  DATE OF BIRTH:  [birth year]  DATE OF PROCEDURE:  12/20/2011                              OPERATIVE REPORT  PREOPERATIVE DIAGNOSIS:  Right breast cancer at 12 o'clock position (T1a, N0).  POSTOPERATIVE DIAGNOSIS:  Right breast cancer at 12 o'clock position (T1a, N0).  PROCEDURE:  Right breast lumpectomy using wire localization and right axillary sentinel lymph node biopsy.  SURGEON:  Sandria Bales. Ezzard Standing, M.D.  FIRST ASSISTANT:  None.  ANESTHESIA:  General LMA supervised by Dr. Ronelle Nigh.  ESTIMATED BLOOD LOSS:  50 cc.  DRAINS:  Drains left in were none.  Local anesthetic with 30 cc of 0.25% Marcaine.  INDICATION OF PROCEDURE:  Ms. Mussell is a 61 year old white female whose primary care physician, Dr. Shary Decamp.  She had a biopsy of her right breast which showed a right breast cancer and [facility] where she was seen by Dr. Drue Second, Dr. Lurline Hare, and Dr. Ovidio Kin.  Discussion has been carried out with the patient about proceeding with treatment including lumpectomy and sentinel lymph node biopsy.  The indication and risks of surgery were explained to the patient.  The risks include, but are not limited to, bleeding, infection, the need for further surgery, and possible nerve injury.  OPERATIVE NOTE:  The patient [facility] from the [facility] where she had a right wire localization for the tumor at the 12 o'clock position of her right breast. In the holding area, she was injected with 1 millicuries of technetium sulfur colloid in a circumareolar fashion.  She was taken to the operating room #3 where she underwent a general anesthesia supervised by Dr. Ronelle Nigh.  A time-out was held and surgical checklist run.  First, I  injected her right breast with 1 cc of 40% methylene blue.  Her right breast was then prepped with ChloraPrep and sterilely draped.  I first identified a right axillary sentinel lymph node.  I made incision in the right axilla and cut down and found a blue node that had counts of 560 with a background of about 40, and this was sent for Pathology.  Hemostasis was controlled with Bovie cautery and 3-0 Vicryl sutures.    I then turned my attention to the right breast cancer.  She had a wire coming out of at the 1 o'clock position in the right breast,  directed to the 12 o'clock position where this tumor was.  I made an elliptical incision approximately 5 cm in length.  I excised a block of breast tissue about 5 cm in size.  I did go down to the chest wall. I painted the specimen with the 6 color paint kit for orientation purposes and did a specimen mammogram.  Specimen mammogram confirmed the clip and the wire were in the middle of the specimen and it appeared to have adequate margins.  I then infiltrated the two incisions witht 30 cc of 0.25% Marcaine.  I placed baby clips the right breast cavity oriented to outline  the biopsy cavity.  I put one clip at 12 o'clock, 3 o'clock, 6 o'clock, 9 o'clock, and one on the chest wall.  The subcutaneous tissue was closed with 3-0 Vicryl suture.  The skin was closed with a 5-0 Monocryl suture and painted with Dermabond.  She was then sterilely dressed and placed in a elastic breasts wrap.  The patient tolerated the procedure well.  Her sponge and needle count were correct.  She was transferred to the recovery room.  Final pathology is pending at the time of this dictation.   Sandria Bales. Ezzard Standing, M.D., FACS   DHN/MEDQ  D:  12/20/2011  T:  12/21/2011  Job:  [id number]  cc:   Thayer Headings, M.D. Fax: [phone]  Thurmon Fair, MD Fax: [phone]  Malva Limes, M.D. Fax: [phone]  YQMVHQ ION GEXB, MD, Luella.Lat Fax: [phone]  Dorisann Frames,  M.D. Fax: [phone]  Drue Second, M.D.  Lurline Hare, M.D. Fax: [phone]

## 2011-12-21 NOTE — Anesthesia Postprocedure Evaluation (Addendum)
  Anesthesia Post-op Note  Patient: Elizabeth Berg  Procedure(s) Performed: Procedure(s) (LRB): BREAST BIOPSY WITH NEEDLE LOCALIZATION (Right) AXILLARY LYMPH NODE BIOPSY (Right)  Patient Location: PACU  Anesthesia Type: General  Level of Consciousness: awake and alert   Airway and Oxygen Therapy: Patient Spontanous Breathing  Post-op Pain: mild  Post-op Assessment: Post-op Vital signs reviewed, Patient's Cardiovascular Status Stable, Respiratory Function Stable, Patent Airway and No signs of Nausea or vomiting  Post-op Vital Signs: stable  Complications: No apparent anesthesia complications

## 2011-12-28 ENCOUNTER — Telehealth: Payer: Self-pay | Admitting: *Deleted

## 2011-12-28 NOTE — Telephone Encounter (Signed)
Spoke with patient to address needs/concerns.  None verbalized.  Says she is doing well.  Upcoming appointments reviewed.  She understands to call with questions.

## 2011-12-29 ENCOUNTER — Ambulatory Visit (INDEPENDENT_AMBULATORY_CARE_PROVIDER_SITE_OTHER): Payer: BC Managed Care – PPO | Admitting: Surgery

## 2011-12-29 ENCOUNTER — Encounter (INDEPENDENT_AMBULATORY_CARE_PROVIDER_SITE_OTHER): Payer: Self-pay | Admitting: Surgery

## 2011-12-29 VITALS — BP 118/66 | HR 79 | Temp 97.2°F | Ht 64.0 in | Wt 195.0 lb

## 2011-12-29 DIAGNOSIS — C50319 Malignant neoplasm of lower-inner quadrant of unspecified female breast: Secondary | ICD-10-CM

## 2011-12-29 NOTE — Progress Notes (Signed)
   Re:   Elizabeth Berg DOB:   [birth year] MRN:   [id number]  [facility]  ASSESSMENT AND PLAN: 1.  Right breast cancer.  T1c, N0.  Final path: 1.8 cm IDC, 0/1 nodes.  Lumpectomy and SLNBx - 12/21/2011.  ER - 100, PR - 16,. Ki67 - 19%, Her2Neu - neg  Drs. Welton Flakes and Michell Heinrich are treating oncologist.  She sees Drs. Welton Flakes and Edith Endave at the end of the month.  I will see her back in 6 months.  2.  Thyroid replacement.  Had total thyroidectomy by Dr. Gerrit Friends - 2008. 3.  History of palpitations. 4.  Hypertension. 5.  Hypercholesterolemia. 6.  GERD.  REFERRING PHYSICIAN:   Dr. Gillermina Phy  HISTORY OF PRESENT ILLNESS: Elizabeth Berg is a 61 y.o. (DOB: 02/15/1951)  white female whose primary care physician is Dr. Gillermina Phy and comes to [facility] for a right breast cancer.  She is somewhat reserved.  She looks good post op.  She has some pain in her right axilla.  No other concerns.   Past Medical History  Diagnosis Date  . H/O colonoscopy 04/14/09  . H/O bone density study   . Wears glasses   . Heart burn   . Breast cancer   . Hypertension     Current Outpatient Prescriptions  Medication Sig Dispense Refill  . HYDROcodone-acetaminophen (NORCO) 5-325 MG per tablet Take 1-2 tablets by mouth every 6 (six) hours as needed for pain.  30 tablet  1  . levothyroxine (SYNTHROID, LEVOTHROID) 137 MCG tablet Take 137 mcg by mouth daily.      Marland Kitchen lisinopril (PRINIVIL,ZESTRIL) 10 MG tablet Take 10 mg by mouth daily.      Marland Kitchen omeprazole (PRILOSEC) 20 MG capsule Take 20 mg by mouth daily.      . rosuvastatin (CRESTOR) 10 MG tablet Take 10 mg by mouth daily.      . verapamil (CALAN) 120 MG tablet Take 120 mg by mouth daily.         Allergies  Allergen Reactions  . Penicillins     REVIEW OF SYSTEMS: Cardiac:  Sees Dr. Royann Shivers, [facility], for hypertension and palpitations. Endocrine:  No diabetes. Sees Dr. Talmage Nap for thyroid replacement.  Had a total thyroidectomy by Dr. Gerrit Friends in  2008. Gastrointestinal:  Has reflux disease and had an EGD last in 2010.  Mother had esophageal ca.  No history of liver disease.  Had lap chole by Dr. Woodfin Ganja in the [phone].  No history of pancreas disease.  Colonoscopy by Dr. Loreta Ave 2010.  SOCIAL and FAMILY HISTORY: Married.  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  PHYSICAL EXAM: BP 118/66  Pulse 79  Temp 97.2 F (36.2 C) (Temporal)  Ht 5\' 4"  (1.626 m)  Wt 195 lb (88.451 kg)  BMI 33.47 kg/m2  SpO2 98%  LMP 12/20/2006  General: WN WF who is alert and generally healthy appearing.  Lymph Nodes:  No supraclavicular, cervical nodes, or axillary nodes. Breast:  Right: Incision at 12 o'clock and right axilla look good.  Left:  No mass or area of concern.  DATA REVIEWED: Path report was given to the patient.  Ovidio Kin, MD,  [facility] [address]  [address]   [place], [place]    [address] Phone:  [phone] FAX:  [phone]

## 2012-01-07 ENCOUNTER — Encounter: Payer: Self-pay | Admitting: Radiation Oncology

## 2012-01-07 NOTE — Progress Notes (Signed)
61 year old female. Married with two sons, 62 and 65. 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 Post menopausal.  Right breast lumpectomy and right axillary sentinel lymph node biopsy done 12/20/11. Pathology confirmed low grade invasive ductal carcinoma T1c, N0, ER PR positive, HER 2 negative.  Plan: 4-6 weeks xrt s/p lumpectomy with five years anti-estrogen to follow. [facility] recommends Tamoxifen or Aromasin.   [id number] No indication of a pacemaker No hx of radiation therapy PCP Dr. Gillermina Phy Med Onc. [facility] Surgeon [facility]

## 2012-01-10 ENCOUNTER — Telehealth: Payer: Self-pay | Admitting: Oncology

## 2012-01-10 ENCOUNTER — Ambulatory Visit (HOSPITAL_BASED_OUTPATIENT_CLINIC_OR_DEPARTMENT_OTHER): Payer: BC Managed Care – PPO | Admitting: Oncology

## 2012-01-10 ENCOUNTER — Encounter: Payer: Self-pay | Admitting: *Deleted

## 2012-01-10 ENCOUNTER — Encounter: Payer: Self-pay | Admitting: Oncology

## 2012-01-10 VITALS — BP 140/90 | HR 64 | Temp 98.5°F | Ht 64.0 in | Wt 195.5 lb

## 2012-01-10 DIAGNOSIS — D0590 Unspecified type of carcinoma in situ of unspecified breast: Secondary | ICD-10-CM

## 2012-01-10 DIAGNOSIS — C50319 Malignant neoplasm of lower-inner quadrant of unspecified female breast: Secondary | ICD-10-CM

## 2012-01-10 NOTE — Patient Instructions (Addendum)
1. Oncotype Dx test ordered.  2. Keep appt with Dr. Michell Heinrich  3. I will see you back in 3 weeks

## 2012-01-10 NOTE — Progress Notes (Signed)
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  PRIOR THERAPY:Graph   #1 patient was originally seen in the multidisciplinary breast clinic since then she has gone on to see a right lumpectomy with sentinel node biopsy. Her final pathology showed an invasive ductal carcinoma that was grade one measuring 1.8 cm no evidence of lymphovascular invasion. All margins were negative tumor was ER +100% PR +37% HER-2/neu negative would Ki-67 of 9% grade 1.. One sentinel node was negative for metastatic disease.  CURRENT THERAPY:Patient will eventually receive radiation therapy. However we will get an Oncotype DX test performed  INTERVAL HISTORY: Mikella Linsley Mulhall 61 y.o. female returns for Followup visit post lumpectomy. Clinically she seems to be doing well she is healing well. Her original tumor when we had seen her at the [facility] was measuring 1.1 cm. The final pathology does show the tumor to be a slightly  Larger at 1.8 cm. She has prognostic markers were which are consistent. Clinically she feels well.  MEDICAL HISTORY: Past Medical History  Diagnosis Date  . H/O colonoscopy 04/14/09  . H/O bone density study   . Wears glasses   . Heart burn   . Hypertension   . 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 GERD (gastroesophageal reflux disease)   . Palpitations   . Hypercholesterolemia     ALLERGIES:  is allergic to penicillins.  MEDICATIONS:  Current Outpatient Prescriptions  Medication Sig Dispense Refill  . levothyroxine (SYNTHROID, LEVOTHROID) 137 MCG tablet Take 137 mcg by mouth daily.      Marland Kitchen lisinopril (PRINIVIL,ZESTRIL) 10 MG tablet Take 10 mg by mouth daily.      Marland Kitchen omeprazole (PRILOSEC) 20 MG capsule Take 20 mg  by mouth daily.      . rosuvastatin (CRESTOR) 10 MG tablet Take 10 mg by mouth daily.      . verapamil (CALAN) 120 MG tablet Take 120 mg by mouth daily.        SURGICAL HISTORY:  Past Surgical History  Procedure Date  . Cholecystectomy   . Thyroid surgery   . Breast biopsy 11/22/2011    right breast biopsy  . Colonoscopy   . Bone density study   . Total thyroidectomy 2008    Dr. Gerrit Friends  . Breast lumpectomy 12/20/2011    right breast lumpectomy and right axillary lymph node biopsy    REVIEW OF SYSTEMS:  A comprehensive review of systems was negative.   PHYSICAL EXAMINATION: General appearance: alert, cooperative and appears stated age Neck: no adenopathy, no carotid bruit, no JVD, supple, symmetrical, trachea midline and thyroid not enlarged, symmetric, no tenderness/mass/nodules Lymph nodes: Cervical, supraclavicular, and axillary nodes normal. Resp: clear to auscultation bilaterally and normal percussion bilaterally Back: symmetric, no curvature. ROM normal. No CVA tenderness. Cardio: regular rate and rhythm, S1, S2 normal, no murmur, click, rub or gallop GI: soft, non-tender; bowel sounds normal; no masses,  no organomegaly Extremities: extremities normal, atraumatic, no cyanosis or edema Neurologic: Grossly normal Breast examination right breast reveals healing incisional scar no masses no nodularity no nipple discharge left breast no masses or nipple discharge. ECOG PERFORMANCE STATUS: 0 - Asymptomatic  Blood pressure 140/90, pulse 64, temperature 98.5 F (36.9 C), temperature source Oral, height 5'  4" (1.626 m), weight 195 lb 8 oz (88.678 kg), last menstrual period 12/20/2006.  LABORATORY DATA: Lab Results  Component Value Date   WBC 6.4 12/15/2011   HGB 14.2 12/15/2011   HCT 42.0 12/15/2011   MCV 86.2 12/15/2011   PLT 225 12/15/2011      Chemistry      Component Value Date/Time   NA 139 12/15/2011 1603   K 4.2 12/15/2011 1603   CL 99 12/15/2011 1603   CO2 31 12/15/2011  1603   BUN 15 12/15/2011 1603   CREATININE 0.79 12/15/2011 1603      Component Value Date/Time   CALCIUM 9.2 12/15/2011 1603   ALKPHOS 67 12/01/2011 0755   AST 18 12/01/2011 0755   ALT 17 12/01/2011 0755   BILITOT 0.5 12/01/2011 0755     ADDITIONAL INFORMATION: 2. CHROMOGENIC IN-SITU HYBRIDIZATION Interpretation HER-2/NEU BY CISH - NO AMPLIFICATION OF HER-2 DETECTED. THE RATIO OF HER-2: CEP 17 SIGNALS WAS 1.45. Reference range: Ratio: HER2:CEP17 < 1.8 - gene amplification not observed Ratio: HER2:CEP 17 1.8-2.2 - equivocal result Ratio: HER2:CEP17 > 2.2 - gene amplification observed Pecola Leisure MD Pathologist, Electronic Signature ( Signed 01/05/2012) FINAL DIAGNOSIS Diagnosis 1. Lymph node, sentinel, biopsy, Right axillary #1 - ONE LYMPH NODE, NEGATIVE FOR TUMOR (0/1). 2. Breast, lumpectomy, Right - INVASIVE DUCTAL CARCINOMA, GRADE I (1.8 CM). - NO LYMPHOVASCULAR INVASION IDENTIFIED. - INVASIVE TUMOR IS 0.9 CM FROM NEAREST MARGIN (POSTERIOR). - SEE TUMOR SYNOPTIC TEMPLATE BELOW. Microscopic Comment 2. BREAST, INVASIVE TUMOR, WITH LYMPH NODE SAMPLING Specimen, including laterality: Right breast Procedure: Lumpectomy 1 of 3 FINAL for JACCI, RUBERG ([id number]) Microscopic Comment(continued) Grade: I of III Tubule formation: 1 Nuclear pleomorphism: 1 Mitotic: 1 Tumor size (gross measurement): 1.8 cm Margins: Invasive, distance to closest margin: 0.9 cm In-situ, distance to closest margin: N/A If margin positive, focally or broadly: N/A Lymphovascular invasion: Absent Ductal carcinoma in situ: Absent Grade: N/A Extensive intraductal component: N/A Lobular neoplasia: Absent Tumor focality: Unifocal Treatment effect: None If present, treatment effect in breast tissue, lymph nodes or both: N/A Extent of tumor: Skin: Grossly negative Nipple: N/A Skeletal muscle: N/A Lymph nodes: # examined: 1 Lymph nodes with metastasis: 0 Breast prognostic profile: Estrogen  receptor: Not repeated, previous study demonstrated 100% positivity ([id number]). Progesterone receptor: Not repeated, previous study demonstrated 37% positivity Her 2 neu: Repeated, previus study demonstrated no amplification (1.23) ([id number]). Ki-67: Not repeated, previous study demonstrated 90% proliferation rate ([id number]). Non-neoplastic breast: Benign fibroadenomatoid nodules and microcalcifications in benign ducts and lobules. TNM: pT1c, pN0, pMX Comments: None (CR:kh 12-22-11) [place] RUND DO Pathologist, Electronic Signature (Case signed 12/22/2011) Specimen Gross and Clinical Information Specimen(s) Obtained: 1. Lymph node, sentinel, biopsy, Right axillary #1 2. Breast, lumpectomy, Right Specimen Clinical Information 1. Right breast cancer (tl) Gross 1. Rapid Intraoperative Consult performed (Yes or No): No Specimen: Right axillary sentinel lymph node Number and size: 1, 2.5 cm Cut Surface(s): Tan yellow to red, focally firm, focally blue Block Summary: The node is bisected and submitted in one block labeled SLN for routine histology 2. Specimen type: Right lumpectomy, in formalin at 14:20 hours 2 of 3 FINAL for SHERLYN, EBBERT ([id number])  RADIOGRAPHIC STUDIES:  Dg Chest 2 View  12/15/2011  *RADIOLOGY REPORT*  Clinical Data: Preoperative respiratory films.  CHEST - 2 VIEW  Comparison: PA and lateral chest.  Findings: Lungs clear. Heart size normal. No pneumothorax or pleural effusion.  IMPRESSION: Negative chest.  Original Report Authenticated By: Bernadene Bell. Maricela Curet, M.D.  Nm Sentinel Node Inj-no Rpt (breast)  12/20/2011  CLINICAL DATA: Right breast cancer   Sulfur colloid was injected intradermally by the nuclear medicine  technologist for breast cancer sentinel node localization.     Mm Breast Surgical Specimen  12/20/2011  *RADIOLOGY REPORT*  Clinical Data:  Known right breast carcinoma  NEEDLE LOCALIZATION WITH MAMMOGRAPHIC GUIDANCE AND SPECIMEN RADIOGRAPH   Patient presents for needle localization prior to surgical excision of right breast carcinoma.  I met with the patient and we discussed the procedure of needle localization including benefits and alternatives. We discussed the high likelihood of a successful procedure. We discussed the risks of the procedure, including infection, bleeding, tissue injury, and further surgery. Informed, written consent was given.  Using mammographic guidance, sterile technique, 2% lidocaine and a 9 cm modified Kopans needle, the mass with clip was localized using a medial approach.  The films are marked for Dr. .  Specimen radiograph was performed at [facility] OR, and confirms the mass, clip, and intact wire to be present in the tissue sample. The specimen is marked for pathology.  IMPRESSION: Needle localization of the carcinoma located within the right breast.  No apparent complications.  Original Report Authenticated By: Rolla Plate, M.D.   Mm Breast Wire Localization Right  12/20/2011  *RADIOLOGY REPORT*  Clinical Data:  Known right breast carcinoma  NEEDLE LOCALIZATION WITH MAMMOGRAPHIC GUIDANCE AND SPECIMEN RADIOGRAPH  Patient presents for needle localization prior to surgical excision of right breast carcinoma.  I met with the patient and we discussed the procedure of needle localization including benefits and alternatives. We discussed the high likelihood of a successful procedure. We discussed the risks of the procedure, including infection, bleeding, tissue injury, and further surgery. Informed, written consent was given.  Using mammographic guidance, sterile technique, 2% lidocaine and a 9 cm modified Kopans needle, the mass with clip was localized using a medial approach.  The films are marked for Dr. .  Specimen radiograph was performed at [facility] OR, and confirms the mass, clip, and intact wire to be present in the tissue sample. The specimen is marked for pathology.  IMPRESSION: Needle localization of the  carcinoma located within the right breast.  No apparent complications.  Original Report Authenticated By: Rolla Plate, M.D.    ASSESSMENT: 61 year old female with T1 C. Invasive ductal carcinoma of the right breast status post lumpectomy with sentinel node biopsy. Patient Roseanne Reno was measuring about at 1.8 cm ER/PR positive HER-2/neu negative. I have recommended testing Oncotype DX for recurrence score. Rationale for this was discussed with the patient.   PLAN:   #1 patient will have an Oncotype DX test performed to evaluate her risk of recurrence at 5 years with an antiestrogen only and to see if she has there is a need for any kind of adjuvant chemotherapy.  #2 she certainly will keep her appointment with Dr. Michell Heinrich for this week.  #3 I will plan on seeing her back in 3 weeks time or sooner.   All questions were answered. The patient knows to call the clinic with any problems, questions or concerns. We can certainly see the patient much sooner if necessary.  I spent 25 minutes counseling the patient face to face. The total time spent in the appointment was 30 minutes.    Drue Second, MD Medical/Oncology [facility] [phone] (beeper) [phone] (Office)  01/10/2012, 2:36 PM

## 2012-01-10 NOTE — Telephone Encounter (Signed)
gve the pt her July 2013 appt calendar °

## 2012-01-10 NOTE — Telephone Encounter (Signed)
gve a copy of the oncotype dx to [employer]

## 2012-01-10 NOTE — Progress Notes (Signed)
Spoke with patient today to address concerns.  None verbalized at the time.    Appointment calendar provided.  She was encouraged to call with questions.  Pt. Agreeable.

## 2012-01-11 ENCOUNTER — Encounter: Payer: Self-pay | Admitting: *Deleted

## 2012-01-11 NOTE — Progress Notes (Signed)
Ordered Oncotype Dx test w/ [facility].  Faxed request to Path.  Faxed PAC to [employer]. 

## 2012-01-12 ENCOUNTER — Ambulatory Visit
Admission: RE | Admit: 2012-01-12 | Discharge: 2012-01-12 | Disposition: A | Discharge status: Home / Self Care | Payer: BC Managed Care – PPO | Source: Ambulatory Visit | Attending: Radiation Oncology | Admitting: Radiation Oncology

## 2012-01-12 ENCOUNTER — Encounter: Payer: Self-pay | Admitting: Radiation Oncology

## 2012-01-12 VITALS — BP 133/86 | HR 65 | Temp 97.4°F | Resp 18 | Ht 64.0 in | Wt 195.9 lb

## 2012-01-12 DIAGNOSIS — Z510 Encounter for antineoplastic radiation therapy: Secondary | ICD-10-CM | POA: Insufficient documentation

## 2012-01-12 DIAGNOSIS — Z170 Estrogen receptor positive status [ER+]: Secondary | ICD-10-CM | POA: Insufficient documentation

## 2012-01-12 DIAGNOSIS — Z79899 Other long term (current) drug therapy: Secondary | ICD-10-CM | POA: Insufficient documentation

## 2012-01-12 DIAGNOSIS — C50319 Malignant neoplasm of lower-inner quadrant of unspecified female breast: Secondary | ICD-10-CM

## 2012-01-12 DIAGNOSIS — L988 Other specified disorders of the skin and subcutaneous tissue: Secondary | ICD-10-CM | POA: Insufficient documentation

## 2012-01-12 DIAGNOSIS — L259 Unspecified contact dermatitis, unspecified cause: Secondary | ICD-10-CM | POA: Insufficient documentation

## 2012-01-12 DIAGNOSIS — C50919 Malignant neoplasm of unspecified site of unspecified female breast: Secondary | ICD-10-CM | POA: Insufficient documentation

## 2012-01-12 NOTE — Progress Notes (Signed)
Complete NUTRITION RISK SCREEN worksheet without concerns submitted to Zenovia Jarred, RD. Also, complete PATIENT MEASURE OF DISTRESS worksheet with a score of 4 submitted to social work.   Specimen sent for oncotype testing; awaiting insurance authorization.  Patient to follow up with Welton Flakes on 01/31/2012. Also, patient to follow up with Dr. Ezzard Standing in six months.

## 2012-01-12 NOTE — Progress Notes (Signed)
Patient presents to the clinic today unaccompanied for a new consultation with Dr. Michell Heinrich to discuss role of xrt in the treatment of breast ca. Patient is alert and oriented to person, place, and time. No distress noted. Steady gait noted. Pleasant affect noted. Patient denies breast pain at this time. Patient reports that her right breast is very tender following surgery more so at the axilla. Patient reports her incisions from lumpectomy have closed and are without redness, edema, or drainage. Patient denies nausea, vomiting, headache, dizziness or diarrhea. Patient reports eating and sleeping without difficulty. Patient reports weight remains stable. Reported all findings to Dr. Michell Heinrich.

## 2012-01-12 NOTE — Progress Notes (Signed)
   Department of Radiation Oncology  Phone:  [phone] Fax:        [phone]   Name: Elizabeth Berg   DOB: 30-Aug-1950  MRN: [id number]    Date: 01/12/2012  Follow Up Visit Note  CC: MACKENZIE,BRIAN  Diagnosis: T1 C. N0 invasive ductal carcinoma of the right breast  Allergies:  Allergies  Allergen Reactions  . Penicillins     Childhood allergy; reaction unknown    Medications:  Current Outpatient Prescriptions  Medication Sig Dispense Refill  . levothyroxine (SYNTHROID, LEVOTHROID) 137 MCG tablet Take 137 mcg by mouth daily.      Marland Kitchen lisinopril (PRINIVIL,ZESTRIL) 10 MG tablet Take 10 mg by mouth daily.      Marland Kitchen omeprazole (PRILOSEC) 20 MG capsule Take 20 mg by mouth daily.      . rosuvastatin (CRESTOR) 10 MG tablet Take 10 mg by mouth daily.      . verapamil (CALAN) 120 MG tablet Take 120 mg by mouth daily.        Interval History: [name] presents today for routine followup.  She had her surgery on June 3. This showed a 1.8 cm tumor which is slightly larger than her original lesion. One sentinel lymph node was negative. Her margins were negative with the closest being posterior at 0.9 cm. Her tumor was ER/PR positive and HER-2 negative. She met with Dr. Welton Flakes who recommended Oncotype testing which is that yesterday. She is awaiting the result of that and has appointment to see Dr. Welton Flakes again on July 15. She has had some redness and some associated irritation secondary to the dye used for her sentinel lymph node injection. He thinks this is getting better. She has been to take a look at this today.  Physical Exam:   height is 5\' 4"  (1.626 m) and weight is 195 lb 14.4 oz (88.86 kg). Her oral temperature is 97.4 F (36.3 C). Her blood pressure is 133/86 and her pulse is 65. Her respiration is 18.  She clearly has before injection sites of her sentinel lymph node dye at the 4 quadrants around her nipple. At about the 9:00 position of the breast she has a red area which looks  to be either erythema versus skin reaction. Is not warm. Her scar is well healed with no evidence of infection there.  IMPRESSION: T1 C. N0 invasive ductal carcinoma the right breast status post lumpectomy and sentinel lymph node biopsy pending Oncotype testing  PLAN:  Elizabeth Berg is a 61 y.o. female who had a slightly larger tumor than we had anticipated. For this reason Oncotype testing his consent. We discussed of course the hope that she will not require chemotherapy. I have scheduled her for simulation on July 15 at the same time as her appointment Dr. Welton Flakes. He would like to proceed on with radiation as soon as possible. I wonder that I might not be in clinic that day and she might be covered by one of my partners. She is okay with [place] in just like to get started as soon as possible. We discussed possible risks and benefits of treatment including but not limited to skin redness, darkening, fatigue, damage to the lungs. We discussed stability of secondary malignancies. She signed informed consent and agree to proceed forward. We discussed the obviously if she requires chemotherapy we could cancel this appointment and she would just proceed on with chemotherapy and her simulation be rescheduled.    Lurline Hare, MD

## 2012-01-12 NOTE — Progress Notes (Signed)
See progress note under physician encounter. 

## 2012-01-13 ENCOUNTER — Telehealth (INDEPENDENT_AMBULATORY_CARE_PROVIDER_SITE_OTHER): Payer: Self-pay | Admitting: General Surgery

## 2012-01-13 NOTE — Telephone Encounter (Signed)
Pt calling in to report new pain at the site of dye injections during her breast bx.  She reported it to Dr. Michell Heinrich, who documented it and suggested she contact Dr. Ezzard Standing about it.  She states she is afebrile, the area is not warm to the touch, but there is pain when touched. Cannot wear seatbelt or turn onto her side without discomfort.  Paged Dr. Ezzard Standing for advice.  He suggested warm compresses for comfort and will see in office if worsens.  Pt updated and understands to call for appt if any fever develops, pain worsens or becomes hot to touch.

## 2012-01-14 ENCOUNTER — Encounter: Payer: Self-pay | Admitting: *Deleted

## 2012-01-14 ENCOUNTER — Telehealth: Payer: Self-pay | Admitting: *Deleted

## 2012-01-14 NOTE — Progress Notes (Signed)
Received notice from pathology department that there was not enough tissue to send out for Oncotype Dx testing.  I informed Dr. Welton Flakes.  I left message for pt to return call in order to explain about Oncotype testing.

## 2012-01-14 NOTE — Telephone Encounter (Signed)
Spoke to pt concerning not enough tissue to send off an Oncotype.  Received verbal understanding.  Confirmed future appts.

## 2012-01-17 ENCOUNTER — Telehealth: Payer: Self-pay | Admitting: *Deleted

## 2012-01-17 NOTE — Telephone Encounter (Signed)
Future appointments confirmed with pt.

## 2012-01-17 NOTE — Telephone Encounter (Signed)
Pt returned call, however, she did not leave msg.  RN left voicemail to return call.

## 2012-01-17 NOTE — Telephone Encounter (Signed)
Returning patients call.  Left msg on voice mail to call back.

## 2012-01-25 ENCOUNTER — Telehealth: Payer: Self-pay | Admitting: Oncology

## 2012-01-25 NOTE — Telephone Encounter (Signed)
lmonvm of the pt adviisng her of the r/s appt time to 2:15pm on the same day of 01/31/2012 instead of 3:30pm

## 2012-01-31 ENCOUNTER — Ambulatory Visit (HOSPITAL_BASED_OUTPATIENT_CLINIC_OR_DEPARTMENT_OTHER): Payer: BC Managed Care – PPO | Admitting: Oncology

## 2012-01-31 ENCOUNTER — Encounter: Payer: Self-pay | Admitting: Oncology

## 2012-01-31 ENCOUNTER — Ambulatory Visit
Admission: RE | Admit: 2012-01-31 | Discharge: 2012-01-31 | Disposition: A | Discharge status: Home / Self Care | Payer: BC Managed Care – PPO | Source: Ambulatory Visit | Attending: Radiation Oncology | Admitting: Radiation Oncology

## 2012-01-31 ENCOUNTER — Telehealth: Payer: Self-pay | Admitting: Oncology

## 2012-01-31 VITALS — BP 150/92 | HR 71 | Temp 98.4°F | Ht 64.0 in | Wt 197.2 lb

## 2012-01-31 DIAGNOSIS — Z170 Estrogen receptor positive status [ER+]: Secondary | ICD-10-CM

## 2012-01-31 DIAGNOSIS — C50319 Malignant neoplasm of lower-inner quadrant of unspecified female breast: Secondary | ICD-10-CM

## 2012-01-31 NOTE — Progress Notes (Signed)
Met with patient to discuss RO billing.  Dx: 174.3 Lower-inner quadrant of breast  Attending Rad: Dr. Iona Hansen Tx: [id number] [address]

## 2012-01-31 NOTE — Telephone Encounter (Signed)
lmonvm adviisng the pt of her appt with dr Welton Flakes in sept

## 2012-01-31 NOTE — Progress Notes (Signed)
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  PRIOR THERAPY:Graph   #1 patient was originally seen in the multidisciplinary breast clinic since then she has gone on to see a right lumpectomy with sentinel node biopsy. Her final pathology showed an invasive ductal carcinoma that was grade one measuring 1.8 cm no evidence of lymphovascular invasion. All margins were negative tumor was ER +100% PR +37% HER-2/neu negative would Ki-67 of 9% grade 1.. One sentinel node was negative for metastatic disease.  #2 we had planned on doing Oncotype DX testing for the 1.8 cm tumor but there is not enough tissue and therefore this was not done.  CURRENT THERAPY:Patient will proceed with radiation therapy INTERVAL HISTORY: Elizabeth Berg 61 y.o. female returns for Followup visit Today. Overall she is doing well she continue point emotional and cries easily. She and I discussed the Oncotype testing situation. She is very accepting of this and understands. Her surgical incision is healing very well she has no tenderness. She has no other complaints. Remainder of the 10 point review of systems is negative. MEDICAL HISTORY: Past Medical History  Diagnosis Date  . H/O colonoscopy 04/14/09  . H/O bone density study   . Wears glasses   . Heart burn   . Hypertension   . 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 GERD (gastroesophageal reflux disease)   . Palpitations   . Hypercholesterolemia     ALLERGIES:  is allergic to penicillins.  MEDICATIONS:  Current Outpatient Prescriptions  Medication Sig Dispense Refill  . levothyroxine (SYNTHROID, LEVOTHROID) 137 MCG tablet Take 137 mcg by mouth daily.      Marland Kitchen lisinopril  (PRINIVIL,ZESTRIL) 10 MG tablet Take 10 mg by mouth daily.      Marland Kitchen omeprazole (PRILOSEC) 20 MG capsule Take 20 mg by mouth daily.      . rosuvastatin (CRESTOR) 10 MG tablet Take 10 mg by mouth daily.      . verapamil (CALAN) 120 MG tablet Take 120 mg by mouth daily.        SURGICAL HISTORY:  Past Surgical History  Procedure Date  . Cholecystectomy   . Thyroid surgery   . Breast biopsy 11/22/2011    right breast biopsy  . Colonoscopy   . Bone density study   . Total thyroidectomy 2008    Dr. Gerrit Berg  . Breast lumpectomy 12/20/2011    right breast lumpectomy and right axillary lymph node biopsy    REVIEW OF SYSTEMS:  A comprehensive review of systems was negative.   PHYSICAL EXAMINATION: General appearance: alert, cooperative and appears stated age Neck: no adenopathy, no carotid bruit, no JVD, supple, symmetrical, trachea midline and thyroid not enlarged, symmetric, no tenderness/mass/nodules Lymph nodes: Cervical, supraclavicular, and axillary nodes normal. Resp: clear to auscultation bilaterally and normal percussion bilaterally Back: symmetric, no curvature. ROM normal. No CVA tenderness. Cardio: regular rate and rhythm, S1, S2 normal, no murmur, click, rub or gallop GI: soft, non-tender; bowel sounds normal; no masses,  no organomegaly Extremities: extremities normal, atraumatic, no cyanosis or edema Neurologic: Grossly normal Breast examination right breast reveals healing incisional scar no masses no nodularity no nipple discharge left breast no masses or nipple discharge. ECOG PERFORMANCE STATUS: 0 - Asymptomatic  Blood pressure 150/92, pulse 71, temperature 98.4 F (36.9 C), temperature source Oral, height 5\' 4"  (1.626 m), weight 197 lb 3.2 oz (89.449 kg), last menstrual period 12/20/2006.  LABORATORY DATA: Lab Results  Component Value Date   WBC 6.4 12/15/2011   HGB 14.2 12/15/2011   HCT 42.0 12/15/2011   MCV 86.2 12/15/2011   PLT 225 12/15/2011      Chemistry        Component Value Date/Time   NA 139 12/15/2011 1603   K 4.2 12/15/2011 1603   CL 99 12/15/2011 1603   CO2 31 12/15/2011 1603   BUN 15 12/15/2011 1603   CREATININE 0.79 12/15/2011 1603      Component Value Date/Time   CALCIUM 9.2 12/15/2011 1603   ALKPHOS 67 12/01/2011 0755   AST 18 12/01/2011 0755   ALT 17 12/01/2011 0755   BILITOT 0.5 12/01/2011 0755     ADDITIONAL INFORMATION: 2. CHROMOGENIC IN-SITU HYBRIDIZATION Interpretation HER-2/NEU BY CISH - NO AMPLIFICATION OF HER-2 DETECTED. THE RATIO OF HER-2: CEP 17 SIGNALS WAS 1.45. Reference range: Ratio: HER2:CEP17 < 1.8 - gene amplification not observed Ratio: HER2:CEP 17 1.8-2.2 - equivocal result Ratio: HER2:CEP17 > 2.2 - gene amplification observed Pecola Leisure MD Pathologist, Electronic Signature ( Signed 01/05/2012) FINAL DIAGNOSIS Diagnosis 1. Lymph node, sentinel, biopsy, Right axillary #1 - ONE LYMPH NODE, NEGATIVE FOR TUMOR (0/1). 2. Breast, lumpectomy, Right - INVASIVE DUCTAL CARCINOMA, GRADE I (1.8 CM). - NO LYMPHOVASCULAR INVASION IDENTIFIED. - INVASIVE TUMOR IS 0.9 CM FROM NEAREST MARGIN (POSTERIOR). - SEE TUMOR SYNOPTIC TEMPLATE BELOW. Microscopic Comment 2. BREAST, INVASIVE TUMOR, WITH LYMPH NODE SAMPLING Specimen, including laterality: Right breast Procedure: Lumpectomy 1 of 3 FINAL for Elizabeth Berg ([id number]) Microscopic Comment(continued) Grade: I of III Tubule formation: 1 Nuclear pleomorphism: 1 Mitotic: 1 Tumor size (gross measurement): 1.8 cm Margins: Invasive, distance to closest margin: 0.9 cm In-situ, distance to closest margin: N/A If margin positive, focally or broadly: N/A Lymphovascular invasion: Absent Ductal carcinoma in situ: Absent Grade: N/A Extensive intraductal component: N/A Lobular neoplasia: Absent Tumor focality: Unifocal Treatment effect: None If present, treatment effect in breast tissue, lymph nodes or both: N/A Extent of tumor: Skin: Grossly negative Nipple:  N/A Skeletal muscle: N/A Lymph nodes: # examined: 1 Lymph nodes with metastasis: 0 Breast prognostic profile: Estrogen receptor: Not repeated, previous study demonstrated 100% positivity ([id number]). Progesterone receptor: Not repeated, previous study demonstrated 37% positivity Her 2 neu: Repeated, previus study demonstrated no amplification (1.23) ([id number]). Ki-67: Not repeated, previous study demonstrated 90% proliferation rate ([id number]). Non-neoplastic breast: Benign fibroadenomatoid nodules and microcalcifications in benign ducts and lobules. TNM: pT1c, pN0, pMX Comments: None (CR:kh 12-22-11) [place] RUND DO Pathologist, Electronic Signature (Case signed 12/22/2011) Specimen Gross and Clinical Information Specimen(s) Obtained: 1. Lymph node, sentinel, biopsy, Right axillary #1 2. Breast, lumpectomy, Right Specimen Clinical Information 1. Right breast cancer (tl) Gross 1. Rapid Intraoperative Consult performed (Yes or No): No Specimen: Right axillary sentinel lymph node Number and size: 1, 2.5 cm Cut Surface(s): Tan yellow to red, focally firm, focally blue Block Summary: The node is bisected and submitted in one block labeled SLN for routine histology 2. Specimen type: Right lumpectomy, in formalin at 14:20 hours 2 of 3 FINAL for MASAYE, GATCHALIAN ([id number])  RADIOGRAPHIC STUDIES:  ASSESSMENT: 61 year old female with T1 C. Invasive ductal carcinoma of the right breast status post lumpectomy with sentinel node biopsy. Patient Roseanne Reno was measuring about at 1.8 cm ER/PR positive HER-2/neu negative. I have recommended testing Oncotype  DX for recurrence score. Rationale for this was discussed with the patient.   PLAN:  #1 we had planned on doing the Oncotype DX testing on the patient's tumor unfortunately there was not enough tissue to test the tumor. Based on this I have recommended that patient proceed with radiation therapy. I do think that her risk Of recurrence is  low since her tumor was grade 1 And it is ER +100%.  #2 patient will be referred to radiation oncology. Once she completes radiation I will plan on seeing her back in starting her on antiestrogen therapy with an aromatase inhibitor.   All questions were answered. The patient knows to call the clinic with any problems, questions or concerns. We can certainly see the patient much sooner if necessary.  I spent 25 minutes counseling the patient face to face. The total time spent in the appointment was 30 minutes.    Drue Second, MD Medical/Oncology [facility] [phone] (beeper) [phone] (Office)  01/31/2012, 2:43 PM

## 2012-01-31 NOTE — Patient Instructions (Addendum)
1. Proceed with radiation  2. I will see you back in 2 months

## 2012-02-01 ENCOUNTER — Telehealth: Payer: Self-pay | Admitting: Radiation Oncology

## 2012-02-01 ENCOUNTER — Telehealth: Payer: Self-pay | Admitting: *Deleted

## 2012-02-01 NOTE — Telephone Encounter (Signed)
Left message requesting return call

## 2012-02-01 NOTE — Telephone Encounter (Signed)
Pt called today with concerns regarding visit 7/15. Pt could not remember if she told mentioned which arm could be used for her blood pressure. She states " i think I gave her the wrong arm, I dont remember. Now I'm worried something will happen and I'm not sure what to do." Discussed with pt we prefer to use the pt's non-surgical side for blood draws/vital signs. However, if surgical side was used for blood pressure, pt should be aware it can be more painful at time blood pressure is taken as it constricts blood vessels. Pt is to report any swelling, pain, redness, bruising or neuropathy in future. Reassured pt it should be okay if surgical side was used for blood pressure, however going forward it's best to use non-surgical side. Pt verbalized understanding and will call if any signs of discomfort to arm.

## 2012-02-01 NOTE — Telephone Encounter (Signed)
Patient returned this writer's message. Patient reports that she has had an overwhelming last few days. Listened, recognized and normalized her feelings. Informed her per Dr. Luciano Cutter request that her measurements checked out and she is a candidate for a shortened coarse of radiation. Explained she would receive 16 treatments to the whole breast and 5 boost to the scar for a total of 21. Patient expressed excitement and relief. Patient states,"this new made her day." Routed this message to Dr. Michell Heinrich.

## 2012-02-01 NOTE — Progress Notes (Addendum)
Name: Elizabeth Berg   MRN: [id number]  Date:  01/31/12  DOB: 10-01-1950  Status:outpatient    DIAGNOSIS: Breast cancer.  CONSENT VERIFIED: yes   SET UP: Patient is setup supine   IMMOBILIZATION:  The following immobilization was used:Custom Moldable Pillow, breast board.   NARRATIVE: [name] was brought to the CT Simulation planning suite.  Identity was confirmed.  All relevant records and images related to the planned course of therapy were reviewed.  Then, the patient was positioned in a stable reproducible clinical set-up for radiation therapy.  Wires were placed to delineate the clinical extent of breast tissue. A wire was placed on the scar as well.  CT images were obtained.  An isocenter was placed. Skin markings were placed.  The CT images were loaded into the planning software where the target and avoidance structures were contoured.  The radiation prescription was entered and confirmed. The patient was discharged in stable condition and tolerated simulation well.    TREATMENT PLANNING NOTE:  Treatment planning then occurred. I have requested : MLC's, isodose plan, basic dose calculation

## 2012-02-07 ENCOUNTER — Ambulatory Visit
Admission: RE | Admit: 2012-02-07 | Discharge: 2012-02-07 | Disposition: A | Discharge status: Home / Self Care | Payer: BC Managed Care – PPO | Source: Ambulatory Visit | Attending: Radiation Oncology | Admitting: Radiation Oncology

## 2012-02-07 ENCOUNTER — Telehealth: Payer: Self-pay | Admitting: Radiation Oncology

## 2012-02-07 ENCOUNTER — Encounter: Payer: Self-pay | Admitting: Radiation Oncology

## 2012-02-07 NOTE — Telephone Encounter (Signed)
02/07/2012 1034 Returned message left by patient. Patient reports that she is driving to her PCP to be evaluated for a sinus infection. Patient is concerned that if she is prescribed an antibiotic it will delay her radiation treatment. Educated patient that an antibiotic for her sinus infection would not delay or affect her radiation treatment. Encouraged patient to notify this writer of new medications prescribed. Patient inquired about side effects related to radiation treatment. Briefly educated patient on skin changes and fatigue but, instructed her we would review these in further detail following her initial treatment. Patient verbalized understanding of all things reviewed. Encouraged patient to call with future needs and she verbalized understanding. Routed this message to Dr. Michell Heinrich.

## 2012-02-08 ENCOUNTER — Ambulatory Visit
Admission: RE | Admit: 2012-02-08 | Discharge: 2012-02-08 | Disposition: A | Discharge status: Home / Self Care | Payer: BC Managed Care – PPO | Source: Ambulatory Visit | Attending: Radiation Oncology | Admitting: Radiation Oncology

## 2012-02-08 DIAGNOSIS — C50319 Malignant neoplasm of lower-inner quadrant of unspecified female breast: Secondary | ICD-10-CM

## 2012-02-08 DIAGNOSIS — C50919 Malignant neoplasm of unspecified site of unspecified female breast: Secondary | ICD-10-CM

## 2012-02-08 NOTE — Progress Notes (Signed)
Weekly Management Note Current Dose: 2.67  Gy  Projected Dose: 52.72 Gy   Narrative:  The patient presents for routine under treatment assessment.  CBCT/MVCT images/Port film x-rays were reviewed.  The chart was checked. No complaints. Question about necessity of treatment, etc.   Physical Findings: Alert. No distress.  Impression:  The patient is tolerating radiation.  Plan:  Continue treatment as planned. Continue RT. RN education performed today.

## 2012-02-09 ENCOUNTER — Ambulatory Visit
Admission: RE | Admit: 2012-02-09 | Discharge: 2012-02-09 | Disposition: A | Discharge status: Home / Self Care | Payer: BC Managed Care – PPO | Source: Ambulatory Visit | Attending: Radiation Oncology | Admitting: Radiation Oncology

## 2012-02-09 ENCOUNTER — Ambulatory Visit: Payer: BC Managed Care – PPO | Admitting: Oncology

## 2012-02-09 MED ORDER — RADIAPLEXRX EX GEL
Freq: Once | CUTANEOUS | Status: AC
  Administered 2012-02-09: 13:00:00 via TOPICAL

## 2012-02-09 MED ORDER — ALRA NON-METALLIC DEODORANT (RAD-ONC)
1.0000 "application " | Freq: Once | TOPICAL | Status: AC
  Administered 2012-02-09: 1 via TOPICAL

## 2012-02-09 NOTE — Progress Notes (Signed)
Late entry from 02/08/2012 at 1818. Received patient in the clinic tonight following initial treatment for post sim education with Sam, RN. Patient is alert and oriented to person, place, and time. No distress noted. Steady gait noted. Pleasant affect noted. Patient denies pain at this time. Patient denies breast pain at this time. Oriented patient with staff and routine of the clinic. Provided patient with RADIATION THERAPY AND YOU handbook then, reviewed pertinent information. Provided patient with radiaplex and alra then, directed upon use. Educated patient on potential side effects and management. All question answered. Provided patient with this writers business card and encouraged to call with needs. Patient verbalized understanding of all things reviewed.

## 2012-02-10 ENCOUNTER — Ambulatory Visit
Admission: RE | Admit: 2012-02-10 | Discharge: 2012-02-10 | Disposition: A | Discharge status: Home / Self Care | Payer: BC Managed Care – PPO | Source: Ambulatory Visit | Attending: Radiation Oncology | Admitting: Radiation Oncology

## 2012-02-11 ENCOUNTER — Ambulatory Visit
Admission: RE | Admit: 2012-02-11 | Discharge: 2012-02-11 | Disposition: A | Discharge status: Home / Self Care | Payer: BC Managed Care – PPO | Source: Ambulatory Visit | Attending: Radiation Oncology | Admitting: Radiation Oncology

## 2012-02-14 ENCOUNTER — Ambulatory Visit
Admission: RE | Admit: 2012-02-14 | Discharge: 2012-02-14 | Disposition: A | Discharge status: Home / Self Care | Payer: BC Managed Care – PPO | Source: Ambulatory Visit | Attending: Radiation Oncology | Admitting: Radiation Oncology

## 2012-02-15 ENCOUNTER — Ambulatory Visit
Admission: RE | Admit: 2012-02-15 | Discharge: 2012-02-15 | Disposition: A | Discharge status: Home / Self Care | Payer: BC Managed Care – PPO | Source: Ambulatory Visit | Attending: Radiation Oncology | Admitting: Radiation Oncology

## 2012-02-15 VITALS — Wt 198.2 lb

## 2012-02-15 DIAGNOSIS — C50319 Malignant neoplasm of lower-inner quadrant of unspecified female breast: Secondary | ICD-10-CM

## 2012-02-15 NOTE — Progress Notes (Signed)
HERE TODAY FOR PUT OF RIGHT BREAST.  SKIN LOOKS GOOD, NO OTHER C/O TODAY.  USING RADIAPLEX ON SKIN

## 2012-02-15 NOTE — Progress Notes (Signed)
Weekly Management Note  Current Dose:  16.02 Gy  Projected Dose: 52.72 Gy   Narrative:  The patient presents for routine under treatment assessment.  CBCT/MVCT images/Port film x-rays were reviewed.  The chart was checked.  She is tolerating her treatments well at this time. She denies any itching or discomfort in the treatment area. She denies any fatigue. She continues to work full-time.   Physical Findings: Weight: 198 lb 3.2 oz (89.903 kg). The lungs are clear. The heart has a regular rhythm and rate. The right breast area shows mild hyperpigmentation changes and erythema.  Impression:  The patient is tolerating radiation.  Plan:  Continue treatment as planned.   -----------------------------------  Billie Lade, PhD, MD

## 2012-02-16 ENCOUNTER — Ambulatory Visit
Admission: RE | Admit: 2012-02-16 | Discharge: 2012-02-16 | Disposition: A | Discharge status: Home / Self Care | Payer: BC Managed Care – PPO | Source: Ambulatory Visit | Attending: Radiation Oncology | Admitting: Radiation Oncology

## 2012-02-17 ENCOUNTER — Ambulatory Visit
Admission: RE | Admit: 2012-02-17 | Discharge: 2012-02-17 | Disposition: A | Discharge status: Home / Self Care | Payer: BC Managed Care – PPO | Source: Ambulatory Visit | Attending: Radiation Oncology | Admitting: Radiation Oncology

## 2012-02-18 ENCOUNTER — Ambulatory Visit
Admission: RE | Admit: 2012-02-18 | Discharge: 2012-02-18 | Disposition: A | Discharge status: Home / Self Care | Payer: BC Managed Care – PPO | Source: Ambulatory Visit | Attending: Radiation Oncology | Admitting: Radiation Oncology

## 2012-02-21 ENCOUNTER — Ambulatory Visit
Admission: RE | Admit: 2012-02-21 | Discharge: 2012-02-21 | Disposition: A | Discharge status: Home / Self Care | Payer: BC Managed Care – PPO | Source: Ambulatory Visit | Attending: Radiation Oncology | Admitting: Radiation Oncology

## 2012-02-22 ENCOUNTER — Ambulatory Visit
Admission: RE | Admit: 2012-02-22 | Discharge: 2012-02-22 | Disposition: A | Discharge status: Home / Self Care | Payer: BC Managed Care – PPO | Source: Ambulatory Visit | Attending: Radiation Oncology | Admitting: Radiation Oncology

## 2012-02-23 ENCOUNTER — Ambulatory Visit
Admission: RE | Admit: 2012-02-23 | Discharge: 2012-02-23 | Disposition: A | Discharge status: Home / Self Care | Payer: BC Managed Care – PPO | Source: Ambulatory Visit | Attending: Radiation Oncology | Admitting: Radiation Oncology

## 2012-02-23 DIAGNOSIS — C50319 Malignant neoplasm of lower-inner quadrant of unspecified female breast: Secondary | ICD-10-CM

## 2012-02-23 NOTE — Progress Notes (Signed)
HERE TODAY FOR PUT OF RIGHT BREAST.  SKIN LOOKS GOOD, PINK IN COLOR WITH A LITTLE RASH BETWEEN BREASTS BUT SHE SAYS IT DOESN'T ITCH NOW.  USING RADIAPLEX.    VS 97.4......18.......128/76...67

## 2012-02-23 NOTE — Progress Notes (Signed)
Weekly Management Note Current Dose: 32.04  Gy  Projected Dose: 52.72 Gy   Narrative:  The patient presents for routine under treatment assessment.  CBCT/MVCT images/Port film x-rays were reviewed.  The chart was checked. Doing well. No complaints. Some irritation medially over the weekend with "bumps" but this resolved. Still working.   Physical Findings: Weight:  . Pink right breast. No desquamation.   Impression:  The patient is tolerating radiation.  Plan:  Continue treatment as planned. Add hydrocortisone if needed. Continue radiaplex.

## 2012-02-24 ENCOUNTER — Ambulatory Visit
Admission: RE | Admit: 2012-02-24 | Discharge: 2012-02-24 | Disposition: A | Discharge status: Home / Self Care | Payer: BC Managed Care – PPO | Source: Ambulatory Visit | Attending: Radiation Oncology | Admitting: Radiation Oncology

## 2012-02-25 ENCOUNTER — Ambulatory Visit
Admission: RE | Admit: 2012-02-25 | Discharge: 2012-02-25 | Disposition: A | Discharge status: Home / Self Care | Payer: BC Managed Care – PPO | Source: Ambulatory Visit | Attending: Radiation Oncology | Admitting: Radiation Oncology

## 2012-02-25 ENCOUNTER — Encounter: Payer: Self-pay | Admitting: Radiation Oncology

## 2012-02-25 NOTE — Progress Notes (Signed)
Name: Elizabeth Berg   MRN: [id number]  Date:  02/25/2012   DOB: 03-10-1951  Status:outpatient    DIAGNOSIS: Breast cancer.  CONSENT VERIFIED: yes   SET UP: Patient is setup supine   IMMOBILIZATION:  The following immobilization was used:Custom Moldable Pillow, breast board.   NARRATIVE: Elizabeth Berg underwent complex simulation and treatment planning for her boost treatment today.  Her tumor volume was outlined on the planning CT scan. The depth of her cavity was 4 Cm.    15  MeV electrons will be prescribed to the 97% Isodose line.   A block will be used for beam modification purposes.  A special port plan is requested.

## 2012-02-28 ENCOUNTER — Ambulatory Visit
Admission: RE | Admit: 2012-02-28 | Discharge: 2012-02-28 | Disposition: A | Discharge status: Home / Self Care | Payer: BC Managed Care – PPO | Source: Ambulatory Visit | Attending: Radiation Oncology | Admitting: Radiation Oncology

## 2012-02-29 ENCOUNTER — Ambulatory Visit
Admission: RE | Admit: 2012-02-29 | Discharge: 2012-02-29 | Disposition: A | Discharge status: Home / Self Care | Payer: BC Managed Care – PPO | Source: Ambulatory Visit | Attending: Radiation Oncology | Admitting: Radiation Oncology

## 2012-02-29 DIAGNOSIS — C50319 Malignant neoplasm of lower-inner quadrant of unspecified female breast: Secondary | ICD-10-CM

## 2012-02-29 MED ORDER — RADIAPLEXRX EX GEL
Freq: Once | CUTANEOUS | Status: AC
  Administered 2012-02-29: 19:00:00 via TOPICAL

## 2012-02-29 NOTE — Addendum Note (Signed)
Encounter addended by: Delynn Flavin, RN on: 02/29/2012  6:37 PM<BR>     Documentation filed: Inpatient MAR, Orders

## 2012-02-29 NOTE — Progress Notes (Signed)
Weekly Management Note Current Dose: 42.72  Gy  Projected Dose: 52.72 Gy   Narrative:  The patient presents for routine under treatment assessment.  CBCT/MVCT images/Port film x-rays were reviewed.  The chart was checked. Doing well. No irritation. Worried about wearing deodorant. Looking forward to discussion AI with Dr. Welton Flakes  Physical Findings: Pink skin. Skin is intact.  Impression:  The patient is tolerating radiation.  Plan:  Continue treatment as planned. Continue radiaplex

## 2012-03-01 ENCOUNTER — Ambulatory Visit
Admission: RE | Admit: 2012-03-01 | Discharge: 2012-03-01 | Disposition: A | Discharge status: Home / Self Care | Payer: BC Managed Care – PPO | Source: Ambulatory Visit | Attending: Radiation Oncology | Admitting: Radiation Oncology

## 2012-03-02 ENCOUNTER — Ambulatory Visit
Admission: RE | Admit: 2012-03-02 | Discharge: 2012-03-02 | Disposition: A | Discharge status: Home / Self Care | Payer: BC Managed Care – PPO | Source: Ambulatory Visit | Attending: Radiation Oncology | Admitting: Radiation Oncology

## 2012-03-03 ENCOUNTER — Ambulatory Visit
Admission: RE | Admit: 2012-03-03 | Discharge: 2012-03-03 | Disposition: A | Discharge status: Home / Self Care | Payer: BC Managed Care – PPO | Source: Ambulatory Visit | Attending: Radiation Oncology | Admitting: Radiation Oncology

## 2012-03-06 ENCOUNTER — Ambulatory Visit
Admission: RE | Admit: 2012-03-06 | Discharge: 2012-03-06 | Disposition: A | Discharge status: Home / Self Care | Payer: BC Managed Care – PPO | Source: Ambulatory Visit | Attending: Radiation Oncology | Admitting: Radiation Oncology

## 2012-03-07 ENCOUNTER — Ambulatory Visit
Admission: RE | Admit: 2012-03-07 | Discharge: 2012-03-07 | Disposition: A | Discharge status: Home / Self Care | Payer: BC Managed Care – PPO | Source: Ambulatory Visit | Attending: Radiation Oncology | Admitting: Radiation Oncology

## 2012-03-07 ENCOUNTER — Encounter: Payer: Self-pay | Admitting: Radiation Oncology

## 2012-03-07 VITALS — BP 127/87 | HR 67 | Resp 18 | Wt 200.0 lb

## 2012-03-07 DIAGNOSIS — C50319 Malignant neoplasm of lower-inner quadrant of unspecified female breast: Secondary | ICD-10-CM

## 2012-03-07 NOTE — Progress Notes (Signed)
Weekly Management Note Current Dose:  52.72 Gy  Projected Dose: 52.72 Gy   Narrative:  The patient presents for routine under treatment assessment.  CBCT/MVCT images/Port film x-rays were reviewed.  The chart was checked. Finishes RT today. Tearful, anxious, overwhelmed by treatment being over. Itching medially.   Physical Findings: Weight: 200 lb (90.719 kg). Dermatitis medially. Some patchy dry desquamation over breast  Impression:  The patient is tolerating radiation.  Plan:  F/u 1 month. Hydrocortisone for itching. We discussed process of grief. I encouraged her to attend [facility].

## 2012-03-07 NOTE — Progress Notes (Signed)
Patient presented to the clinic today for PUT with Dr. Michell Heinrich. Patient is alert and oriented to person, place, and time. No distress noted. Steady gait noted. Pleasant affect noted. Patient denies pain at this time. Hyperpigmentation of right/treated breast note with dermatitis at center of chest wall. Patient reports using hydrocortisone and radiaplex on treated skin. Patient completes treatment today. Provided patient with one follow up appointment card. Reinforced skin care. Provided patient with [facility] and [facility] flyers then, reviewed each. Encouraged patient to call with any future needs. Patient denies breast pain or nipple discharge. Patient verbalized understanding of all reviewed. Informed Dr. Michell Heinrich of all findings.

## 2012-03-08 ENCOUNTER — Ambulatory Visit: Payer: BC Managed Care – PPO

## 2012-03-09 ENCOUNTER — Ambulatory Visit: Payer: BC Managed Care – PPO

## 2012-03-10 ENCOUNTER — Ambulatory Visit: Payer: BC Managed Care – PPO

## 2012-03-14 NOTE — Progress Notes (Signed)
  Radiation Oncology         [phone] ________________________________  Name: Elizabeth Berg MRN: [id number]  Date: 03/07/2012  DOB: Jun 08, 1951  End of Treatment Note  Diagnosis:   T1N0 right breast cancer  Indication for treatment:  Curative       Radiation treatment dates:   02/08/2012-03/07/2012  Site/dose:   Right breast / 42.72 Wallace Cullens   Right breast boost / 10 Wallace Cullens  Beams/energy:   Opposed tangents with reduced fields / 6 and 10 MV photons    En face electrons / 15 MeV electrons  Narrative: The patient tolerated radiation treatment relatively well.   She had dermatitis which was treated with radiaplex and hydrocortisone.   Plan: The patient has completed radiation treatment. The patient will return to radiation oncology clinic for routine followup in one month. I advised them to call or return sooner if they have any questions or concerns related to their recovery or treatment.  ------------------------------------------------  Lurline Hare, MD

## 2012-03-14 NOTE — Progress Notes (Signed)
  Radiation Oncology         [phone]) [phone] ________________________________  Name: Elizabeth Berg MRN: [id number]  Date: 02/07/2012  DOB: 02-21-1951  Simulation Verification Note  Status:  outpatient  NARRATIVE: The patient was brought to the treatment unit and placed in the planned treatment position. The clinical setup was verified. Then port films were obtained and uploaded to the radiation oncology medical record software.  The treatment beams were carefully compared against the planned radiation fields. The position location and shape of the radiation fields was reviewed. The targeted volume of tissue appears appropriately covered by the radiation beams. Organs at risk appear to be excluded as planned.  Based on my personal review, I approved the simulation verification. The patient's treatment will proceed as planned.  ------------------------------------------------  Lurline Hare, MD

## 2012-03-21 ENCOUNTER — Telehealth: Payer: Self-pay | Admitting: Radiation Oncology

## 2012-03-21 NOTE — Telephone Encounter (Signed)
Returned patient's call. Patient questioning if [place]. Patient denies having any breaks or openings of her treated skin. Instructed patient it was OK to use the pool but, to shower promptly afterwards and apply moisturizer. Encouraged patient to phone with future needs. Patient verbalized understanding of all reviewed.

## 2012-03-21 NOTE — Telephone Encounter (Signed)
Also, encouraged patient to keep treatment area covered and not exposed to sunlight. Patient verbalized understanding.

## 2012-04-05 ENCOUNTER — Encounter: Payer: Self-pay | Admitting: Oncology

## 2012-04-05 ENCOUNTER — Telehealth: Payer: Self-pay | Admitting: *Deleted

## 2012-04-05 ENCOUNTER — Encounter: Payer: Self-pay | Admitting: *Deleted

## 2012-04-05 ENCOUNTER — Ambulatory Visit (HOSPITAL_BASED_OUTPATIENT_CLINIC_OR_DEPARTMENT_OTHER): Payer: BC Managed Care – PPO | Admitting: Oncology

## 2012-04-05 VITALS — BP 135/87 | HR 65 | Temp 98.2°F | Resp 20 | Ht 64.0 in | Wt 204.3 lb

## 2012-04-05 DIAGNOSIS — Z170 Estrogen receptor positive status [ER+]: Secondary | ICD-10-CM

## 2012-04-05 DIAGNOSIS — C50219 Malignant neoplasm of upper-inner quadrant of unspecified female breast: Secondary | ICD-10-CM

## 2012-04-05 DIAGNOSIS — C50319 Malignant neoplasm of lower-inner quadrant of unspecified female breast: Secondary | ICD-10-CM

## 2012-04-05 MED ORDER — EXEMESTANE 25 MG PO TABS: 25.0000 mg | ORAL_TABLET | Freq: Every day | ORAL | Status: DC

## 2012-04-05 NOTE — Progress Notes (Signed)
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  PRIOR THERAPY:Graph   #1 patient was originally seen in the multidisciplinary breast clinic since then she has gone on to see a right lumpectomy with sentinel node biopsy. Her final pathology showed an invasive ductal carcinoma that was grade one measuring 1.8 cm no evidence of lymphovascular invasion. All margins were negative tumor was ER +100% PR +37% HER-2/neu negative would Ki-67 of 9% grade 1.. One sentinel node was negative for metastatic disease.  #2 we had planned on doing Oncotype DX testing for the 1.8 cm tumor but there is not enough tissue and therefore this was not done.  #3 patient has now completed radiation therapy..  #4 patient will begin antiestrogen therapy with Aromasin 25 mg daily. This will start today 04/05/2012. A total of 5 years of therapy is planned.  Patient understands the risks and benefits and followup.  CURRENT THERAPY:Aromasin 25 mg daily INTERVAL HISTORY: Elizabeth Berg 61 y.o. female returns for Followup visit Today.overall patient tolerated the radiation quite well without any significant problems she does have some redness to the breast but that is expected and is very normal. She otherwise denies any nausea vomiting fevers chills night sweats headaches shortness of breath chest pains palpitations. She still remains very concerned regarding using antiestrogen therapy. She has been doing quite a bit of reading. She is very concerned about the side effects. When she came in to the exam room she stated that she was not going to take anything as she doesn't feel that it is absolutely necessary. We had an extensive discussion lasting quite a long time and after.she did  agree to trying the Aromasin. I gave her the prescription risks and benefits were also discussed with the patient. Remainder of the template review of systems is negative.  MEDICAL HISTORY: Past Medical History  Diagnosis Date  . H/O colonoscopy 04/14/09  . H/O bone density study   . Wears glasses   . Heart burn   . Hypertension   . 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 GERD (gastroesophageal reflux disease)   . Palpitations   . Hypercholesterolemia     ALLERGIES:  is allergic to penicillins.  MEDICATIONS:  Current Outpatient Prescriptions  Medication Sig Dispense Refill  . ACCU-CHEK AVIVA PLUS test strip       . cefUROXime (CEFTIN) 500 MG tablet       . fluticasone (FLONASE) 50 MCG/ACT nasal spray       . levothyroxine (SYNTHROID, LEVOTHROID) 137 MCG tablet Take 137 mcg by mouth daily.      Marland Kitchen lisinopril (PRINIVIL,ZESTRIL) 10 MG tablet Take 10 mg by mouth daily.      . non-metallic deodorant Thornton Papas) MISC Apply 1 application topically daily as needed.      Marland Kitchen omeprazole (PRILOSEC) 20 MG capsule Take 20 mg by mouth daily.      . rosuvastatin (CRESTOR) 10 MG tablet Take 10 mg by mouth daily.      . verapamil (CALAN) 120 MG tablet Take 120 mg by mouth daily.      . Wound Cleansers (RADIAPLEX EX) Apply topically.        SURGICAL HISTORY:  Past Surgical History  Procedure Date  .  Cholecystectomy   . Thyroid surgery   . Breast biopsy 11/22/2011    right breast biopsy  . Colonoscopy   . Bone density study   . Total thyroidectomy 2008    Dr. Gerrit Friends  . Breast lumpectomy 12/20/2011    right breast lumpectomy and right axillary lymph node biopsy    REVIEW OF SYSTEMS:  A comprehensive review of systems was negative.   PHYSICAL EXAMINATION: General appearance: alert, cooperative and appears stated age Neck: no adenopathy, no carotid bruit, no JVD, supple, symmetrical, trachea midline and thyroid not enlarged, symmetric, no tenderness/mass/nodules Lymph  nodes: Cervical, supraclavicular, and axillary nodes normal. Resp: clear to auscultation bilaterally and normal percussion bilaterally Back: symmetric, no curvature. ROM normal. No CVA tenderness. Cardio: regular rate and rhythm, S1, S2 normal, no murmur, click, rub or gallop GI: soft, non-tender; bowel sounds normal; no masses,  no organomegaly Extremities: extremities normal, atraumatic, no cyanosis or edema Neurologic: Grossly normal Breast examination right breast reveals healing incisional scar no masses no nodularity no nipple discharge left breast no masses or nipple discharge. ECOG PERFORMANCE STATUS: 0 - Asymptomatic  Blood pressure 135/87, pulse 65, temperature 98.2 F (36.8 C), temperature source Oral, resp. rate 20, height 5\' 4"  (1.626 m), weight 204 lb 4.8 oz (92.67 kg), last menstrual period 12/20/2006.  LABORATORY DATA: Lab Results  Component Value Date   WBC 6.4 12/15/2011   HGB 14.2 12/15/2011   HCT 42.0 12/15/2011   MCV 86.2 12/15/2011   PLT 225 12/15/2011      Chemistry      Component Value Date/Time   NA 139 12/15/2011 1603   K 4.2 12/15/2011 1603   CL 99 12/15/2011 1603   CO2 31 12/15/2011 1603   BUN 15 12/15/2011 1603   CREATININE 0.79 12/15/2011 1603      Component Value Date/Time   CALCIUM 9.2 12/15/2011 1603   ALKPHOS 67 12/01/2011 0755   AST 18 12/01/2011 0755   ALT 17 12/01/2011 0755   BILITOT 0.5 12/01/2011 0755     ADDITIONAL INFORMATION: 2. CHROMOGENIC IN-SITU HYBRIDIZATION Interpretation HER-2/NEU BY CISH - NO AMPLIFICATION OF HER-2 DETECTED. THE RATIO OF HER-2: CEP 17 SIGNALS WAS 1.45. Reference range: Ratio: HER2:CEP17 < 1.8 - gene amplification not observed Ratio: HER2:CEP 17 1.8-2.2 - equivocal result Ratio: HER2:CEP17 > 2.2 - gene amplification observed Pecola Leisure MD Pathologist, Electronic Signature ( Signed 01/05/2012) FINAL DIAGNOSIS Diagnosis 1. Lymph node, sentinel, biopsy, Right axillary #1 - ONE LYMPH NODE, NEGATIVE FOR TUMOR  (0/1). 2. Breast, lumpectomy, Right - INVASIVE DUCTAL CARCINOMA, GRADE I (1.8 CM). - NO LYMPHOVASCULAR INVASION IDENTIFIED. - INVASIVE TUMOR IS 0.9 CM FROM NEAREST MARGIN (POSTERIOR). - SEE TUMOR SYNOPTIC TEMPLATE BELOW. Microscopic Comment 2. BREAST, INVASIVE TUMOR, WITH LYMPH NODE SAMPLING Specimen, including laterality: Right breast Procedure: Lumpectomy 1 of 3 FINAL for Elizabeth, Berg ([id number]) Microscopic Comment(continued) Grade: I of III Tubule formation: 1 Nuclear pleomorphism: 1 Mitotic: 1 Tumor size (gross measurement): 1.8 cm Margins: Invasive, distance to closest margin: 0.9 cm In-situ, distance to closest margin: N/A If margin positive, focally or broadly: N/A Lymphovascular invasion: Absent Ductal carcinoma in situ: Absent Grade: N/A Extensive intraductal component: N/A Lobular neoplasia: Absent Tumor focality: Unifocal Treatment effect: None If present, treatment effect in breast tissue, lymph nodes or both: N/A Extent of tumor: Skin: Grossly negative Nipple: N/A Skeletal muscle: N/A Lymph nodes: # examined: 1 Lymph nodes with metastasis: 0 Breast prognostic profile: Estrogen receptor: Not repeated, previous study demonstrated 100% positivity ([id number]). Progesterone  receptor: Not repeated, previous study demonstrated 37% positivity Her 2 neu: Repeated, previus study demonstrated no amplification (1.23) ([id number]). Ki-67: Not repeated, previous study demonstrated 90% proliferation rate ([id number]). Non-neoplastic breast: Benign fibroadenomatoid nodules and microcalcifications in benign ducts and lobules. TNM: pT1c, pN0, pMX Comments: None (CR:kh 12-22-11) [place] RUND DO Pathologist, Electronic Signature (Case signed 12/22/2011) Specimen Gross and Clinical Information Specimen(s) Obtained: 1. Lymph node, sentinel, biopsy, Right axillary #1 2. Breast, lumpectomy, Right Specimen Clinical Information 1. Right breast cancer (tl) Gross 1.  Rapid Intraoperative Consult performed (Yes or No): No Specimen: Right axillary sentinel lymph node Number and size: 1, 2.5 cm Cut Surface(s): Tan yellow to red, focally firm, focally blue Block Summary: The node is bisected and submitted in one block labeled SLN for routine histology 2. Specimen type: Right lumpectomy, in formalin at 14:20 hours 2 of 3 FINAL for ERICHA, DROHAN ([id number])  RADIOGRAPHIC STUDIES:  ASSESSMENT: 61 year old female with T1 C. Invasive ductal carcinoma of the right breast status post lumpectomy with sentinel node biopsy. Patient Roseanne Reno was measuring about at 1.8 cm ER/PR positive HER-2/neu negative. I have recommended testing Oncotype DX for recurrence score. Rationale for this was discussed with the patient.  #2 patient has now completed radiation therapy her tumor was ER positive therefore she is recommended antiestrogen therapy with Aromasin 25 mg daily.   PLAN:  #1 we had planned on doing the Oncotype DX testing on the patient's tumor unfortunately there was not enough tissue to test the tumor. Based on this I have recommended that patient proceed with radiation therapy. I do think that her risk Of recurrence is low since her tumor was grade 1 And it is ER +100%.  #2 patient has now completed radiation therapy. She is seen today for discussion of treatment options with antiestrogen therapy. However she tells me that she is not willing to take this. We had an extensive discussion today regarding the rationale for antiestrogen therapy. She has certainly been doing a lot of reading. After quite a lengthy discussion she has opted to proceed with the Aromasin 25 however she will call me if she has any problems.  All questions were answered. The patient knows to call the clinic with any problems, questions or concerns. We can certainly see the patient much sooner if necessary.  I spent 25 minutes counseling the patient face to face. The total time spent in the  appointment was 30 minutes.    Drue Second, MD Medical/Oncology [facility] [phone] (beeper) [phone] (Office)  04/05/2012, 10:51 AM

## 2012-04-05 NOTE — Patient Instructions (Addendum)
Proceed with anti-estrogen therapy with aromasin 25 mg daily. Total duration of therapy is 5 years  Risks and benefits discussed and further information as below  I will will see you back in 3 months but please call if you begin having problems sooner.  You need to get a bone density scan done at your Primary care physicians office  Exemestane tablets What is this medicine? EXEMESTANE (ex e MES tane) blocks the production of the hormone estrogen. Some types of breast cancer depend on estrogen to grow, and this medicine can stop tumor growth by blocking estrogen production. This medicine is for the treatment of breast cancer in postmenopausal women only. This medicine may be used for other purposes; ask your health care provider or pharmacist if you have questions. What should I tell my health care provider before I take this medicine? They need to know if you have any of these conditions: -an unusual or allergic reaction to exemestane, other medicines, foods, dyes, or preservatives -pregnant or trying to get pregnant -breast-feeding How should I use this medicine? Take this medicine by mouth with a glass of water. Follow the directions on the prescription label. Take your doses at regular intervals after a meal. Do not take your medicine more often than directed. Do not stop taking except on the advice of your doctor or health care professional. Contact your pediatrician regarding the use of this medicine in children. Special care may be needed. Overdosage: If you think you have taken too much of this medicine contact a poison control center or emergency room at once. NOTE: This medicine is only for you. Do not share this medicine with others. What if I miss a dose? If you miss a dose, take the next dose as usual. Do not try to make up the missed dose. Do not take double or extra doses. What may interact with this medicine? Do not take this medicine with any of the following  medications: -female hormones, like estrogens and birth control pills This medicine may also interact with the following medications: -androstenedione -phenytoin -rifabutin, rifampin, or rifapentine -St. John's Wort This list may not describe all possible interactions. Give your health care provider a list of all the medicines, herbs, non-prescription drugs, or dietary supplements you use. Also tell them if you smoke, drink alcohol, or use illegal drugs. Some items may interact with your medicine. What should I watch for while using this medicine? Visit your doctor or health care professional for regular checks on your progress. If you experience hot flashes or sweating while taking this medicine, avoid alcohol, smoking and drinks with caffeine. This may help to decrease these side effects. What side effects may I notice from receiving this medicine? Side effects that you should report to your doctor or health care professional as soon as possible: -any new or unusual symptoms -changes in vision -fever -leg or arm swelling -pain in bones, joints, or muscles -pain in hips, back, ribs, arms, shoulders, or legs Side effects that usually do not require medical attention (report to your doctor or health care professional if they continue or are bothersome): -difficulty sleeping -headache -hot flashes -sweating -unusually weak or tired This list may not describe all possible side effects. Call your doctor for medical advice about side effects. You may report side effects to FDA at 1-800-FDA-1088. Where should I keep my medicine? Keep out of the reach of children. Store at room temperature between 15 and 30 degrees C (59 and 86 degrees F). Throw  away any unused medicine after the expiration date. NOTE: This sheet is a summary. It may not cover all possible information. If you have questions about this medicine, talk to your doctor, pharmacist, or health care provider.  2012, [employer]vier/Gold  Standard. (11/07/2007 11:48:29 AM)

## 2012-04-05 NOTE — Progress Notes (Signed)
Spoke with patient who verbalizes things are going quite well.  Gives praises to cancer center staff and the convenience of scheduling radiation treatments.  Says she is contemplating on taking hormones.  RN encouraged patient to hear benefits/risks, ask appropriate questions and come to an agreement.  Reports weight gain.  Calendar of scheduled events given- support group, nutrition class also mentioned.  Future appointments reviewed.  She was encouraged to call if questions/concerns occurs.  Patient verbalized understanding and agreed.

## 2012-04-05 NOTE — Telephone Encounter (Signed)
Gave patient appointment for 07-05-2012 starting at 8:30am

## 2012-04-20 ENCOUNTER — Ambulatory Visit
Admission: RE | Admit: 2012-04-20 | Discharge: 2012-04-20 | Disposition: A | Discharge status: Home / Self Care | Payer: BC Managed Care – PPO | Source: Ambulatory Visit | Attending: Radiation Oncology | Admitting: Radiation Oncology

## 2012-04-20 VITALS — BP 134/79 | HR 67 | Temp 99.1°F | Wt 203.3 lb

## 2012-04-20 DIAGNOSIS — C50319 Malignant neoplasm of lower-inner quadrant of unspecified female breast: Secondary | ICD-10-CM

## 2012-04-20 NOTE — Progress Notes (Signed)
   Department of Radiation Oncology  Phone:  [phone] Fax:        [phone]   Name: Elizabeth Berg   DOB: October 03, 1950  MRN: [id number]    Date: 04/20/2012  Follow Up Visit Note  Diagnosis: T1 N0 invasive ductal carcinoma of the right breast  Interval since last radiation: One  Interval History: Elizabeth Berg presents today for routine followup.  She is feeling well and doing well. She's continuing to use a Radiaplex on her skin. After discussion with Dr. Welton Flakes she has elected to take Aromasin and she is tolerating that very well. She is not having much in the way of side effects. She has no breast pain but does have some tenderness when she lays on outside.  Allergies:  Allergies  Allergen Reactions  . Penicillins     Childhood allergy; reaction unknown    Medications:  Current Outpatient Prescriptions  Medication Sig Dispense Refill  . ACCU-CHEK AVIVA PLUS test strip       . exemestane (AROMASIN) 25 MG tablet Take 1 tablet (25 mg total) by mouth daily after breakfast.  90 tablet  6  . levothyroxine (SYNTHROID, LEVOTHROID) 137 MCG tablet Take 137 mcg by mouth daily.      Marland Kitchen lisinopril (PRINIVIL,ZESTRIL) 10 MG tablet Take 10 mg by mouth daily.      Marland Kitchen omeprazole (PRILOSEC) 20 MG capsule Take 20 mg by mouth daily.      . rosuvastatin (CRESTOR) 10 MG tablet Take 10 mg by mouth daily.      . verapamil (CALAN) 120 MG tablet Take 120 mg by mouth daily.      . cefUROXime (CEFTIN) 500 MG tablet       . fluticasone (FLONASE) 50 MCG/ACT nasal spray       . non-metallic deodorant (ALRA) MISC Apply 1 application topically daily as needed.      . Wound Cleansers (RADIAPLEX EX) Apply topically.        Physical Exam:   weight is 203 lb 4.8 oz (92.216 kg). Her temperature is 99.1 F (37.3 C). Her blood pressure is 134/79 and her pulse is 67.  She has hyperpigmentation of the right breast as compared to the left.  IMPRESSION: Elizabeth Berg is a 61 y.o. female status post radiation with  resolving acute effects of treatment  PLAN:  Patient is healed up well. She is regular scheduled followup with Dr. Welton Flakes. She will continue her antiestrogen therapy. She knows she should undergo yearly mammograms and we have discussed that in detail today. I have not scheduled followup with her but I be happy to see her back on a as-needed basis.    Lurline Hare, MD

## 2012-04-20 NOTE — Progress Notes (Signed)
Patient here for routine one month follow completion of radiation for right breast cancer.Right breast with continued hyperpigmentation.Continued application of radiaplex but informed to get lotion with vitamin E.Has started aromasin. Denies pain.No fatigue.

## 2012-05-22 ENCOUNTER — Telehealth: Payer: Self-pay | Admitting: Radiation Oncology

## 2012-05-22 NOTE — Telephone Encounter (Signed)
Patient phoned today because she is concern about occasional discomfort and warmth in treated breast. Patient denies swelling in breast or arm of affected side. Also, patient denies nipple discharge. Explained these were normal changes related with using the affected side more. Encouraged patient to attend [employer] exercise class and provided her with contact number. Encouraged patient to call with future needs or if the pain became worse. Patient verbalized understanding of all things reviewed.

## 2012-05-29 ENCOUNTER — Telehealth: Payer: Self-pay | Admitting: *Deleted

## 2012-05-29 NOTE — Telephone Encounter (Signed)
Spoke with patient to address issues related treatment side-effects.  She denies problems/concerns.  Says things are going great.  She continues to work regularly.  Relates taking antiestrogen medication. She is aware of upcoming appointment with medical oncologist.  She does not express any needs at this time. Patient does however,  understand to call if questions should occur.

## 2012-07-04 ENCOUNTER — Telehealth: Payer: Self-pay | Admitting: *Deleted

## 2012-07-04 ENCOUNTER — Other Ambulatory Visit (HOSPITAL_BASED_OUTPATIENT_CLINIC_OR_DEPARTMENT_OTHER): Payer: BC Managed Care – PPO | Admitting: Lab

## 2012-07-04 ENCOUNTER — Ambulatory Visit (HOSPITAL_BASED_OUTPATIENT_CLINIC_OR_DEPARTMENT_OTHER): Payer: BC Managed Care – PPO | Admitting: Oncology

## 2012-07-04 ENCOUNTER — Encounter: Payer: Self-pay | Admitting: Oncology

## 2012-07-04 VITALS — BP 126/84 | HR 65 | Temp 99.0°F | Resp 20 | Ht 64.0 in | Wt 207.4 lb

## 2012-07-04 DIAGNOSIS — C50319 Malignant neoplasm of lower-inner quadrant of unspecified female breast: Secondary | ICD-10-CM

## 2012-07-04 DIAGNOSIS — Z170 Estrogen receptor positive status [ER+]: Secondary | ICD-10-CM

## 2012-07-04 DIAGNOSIS — C50219 Malignant neoplasm of upper-inner quadrant of unspecified female breast: Secondary | ICD-10-CM

## 2012-07-04 LAB — CBC WITH DIFFERENTIAL/PLATELET
Basophils Absolute: 0 10*3/uL (ref 0.0–0.1)
HCT: 42.5 % (ref 34.8–46.6)
HGB: 14.2 g/dL (ref 11.6–15.9)
LYMPH%: 19.3 % (ref 14.0–49.7)
MCHC: 33.5 g/dL (ref 31.5–36.0)
MONO#: 0.4 10*3/uL (ref 0.1–0.9)
NEUT%: 72 % (ref 38.4–76.8)
Platelets: 212 10*3/uL (ref 145–400)
WBC: 5.6 10*3/uL (ref 3.9–10.3)
lymph#: 1.1 10*3/uL (ref 0.9–3.3)

## 2012-07-04 LAB — COMPREHENSIVE METABOLIC PANEL (CC13)
BUN: 16 mg/dL (ref 7.0–26.0)
CO2: 31 mEq/L — ABNORMAL HIGH (ref 22–29)
Calcium: 9.2 mg/dL (ref 8.4–10.4)
Chloride: 101 mEq/L (ref 98–107)
Creatinine: 1 mg/dL (ref 0.6–1.1)
Glucose: 99 mg/dl (ref 70–99)
Total Bilirubin: 0.89 mg/dL (ref 0.20–1.20)

## 2012-07-04 NOTE — Telephone Encounter (Signed)
Gave patient appointment for 01-05-2013 starting at 8:30am

## 2012-07-04 NOTE — Patient Instructions (Addendum)
Continue aromasin daily  Get yourself a good exercise plan  Plan good meals and healthy lifestyle

## 2012-07-04 NOTE — Progress Notes (Signed)
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  PRIOR THERAPY:   #1 patient was originally seen in the multidisciplinary breast clinic since then she has gone on to see a right lumpectomy with sentinel node biopsy. Her final pathology showed an invasive ductal carcinoma that was grade one measuring 1.8 cm no evidence of lymphovascular invasion. All margins were negative tumor was ER +100% PR +37% HER-2/neu negative would Ki-67 of 9% grade 1.. One sentinel node was negative for metastatic disease.  #2 we had planned on doing Oncotype DX testing for the 1.8 cm tumor but there is not enough tissue and therefore this was not done.  #3 patient has now completed radiation therapy..  #4 patient will begin antiestrogen therapy with Aromasin 25 mg daily. This will start today 04/05/2012. A total of 5 years of therapy is planned.  Patient understands the risks and benefits and followup.  CURRENT THERAPY:Aromasin 25 mg daily  INTERVAL HISTORY: Elizabeth Berg 61 y.o. female returns for Followup visit Today. MEDICAL HISTORY: Past Medical History  Diagnosis Date  . H/O colonoscopy 04/14/09  . H/O bone density study   . Wears glasses   . Heart burn   . Hypertension   . 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 GERD (gastroesophageal reflux disease)   . Palpitations   . Hypercholesterolemia     ALLERGIES:  is allergic to penicillins.  MEDICATIONS:  Current Outpatient Prescriptions  Medication Sig Dispense Refill  . ACCU-CHEK AVIVA PLUS test strip       . cefUROXime (CEFTIN) 500 MG tablet       . exemestane (AROMASIN) 25 MG tablet Take 1 tablet (25 mg total) by mouth daily after breakfast.  90 tablet  6  . fluticasone  (FLONASE) 50 MCG/ACT nasal spray       . levothyroxine (SYNTHROID, LEVOTHROID) 137 MCG tablet Take 137 mcg by mouth daily.      Marland Kitchen lisinopril (PRINIVIL,ZESTRIL) 10 MG tablet Take 10 mg by mouth daily.      . non-metallic deodorant Thornton Papas) MISC Apply 1 application topically daily as needed.      Marland Kitchen omeprazole (PRILOSEC) 20 MG capsule Take 20 mg by mouth daily.      . rosuvastatin (CRESTOR) 10 MG tablet Take 10 mg by mouth daily.      . verapamil (CALAN) 120 MG tablet Take 120 mg by mouth daily.      . Wound Cleansers (RADIAPLEX EX) Apply topically.        SURGICAL HISTORY:  Past Surgical History  Procedure Date  . Cholecystectomy   . Thyroid surgery   . Breast biopsy 11/22/2011    right breast biopsy  . Colonoscopy   . Bone density study   . Total thyroidectomy 2008    Dr. Gerrit Friends  . Breast lumpectomy 12/20/2011    right breast lumpectomy and right axillary lymph node biopsy    REVIEW OF SYSTEMS:  A comprehensive review of systems was negative.   PHYSICAL EXAMINATION: General appearance: alert, cooperative and appears stated age Neck: no adenopathy, no carotid bruit, no JVD, supple, symmetrical, trachea midline and thyroid not enlarged, symmetric, no tenderness/mass/nodules Lymph nodes: Cervical, supraclavicular, and axillary nodes normal. Resp: clear to auscultation bilaterally and  normal percussion bilaterally Back: symmetric, no curvature. ROM normal. No CVA tenderness. Cardio: regular rate and rhythm, S1, S2 normal, no murmur, click, rub or gallop GI: soft, non-tender; bowel sounds normal; no masses,  no organomegaly Extremities: extremities normal, atraumatic, no cyanosis or edema Neurologic: Grossly normal Breast examination right breast reveals healing incisional scar no masses no nodularity no nipple discharge left breast no masses or nipple discharge. ECOG PERFORMANCE STATUS: 0 - Asymptomatic  Blood pressure 126/84, pulse 65, temperature 99 F (37.2 C), temperature source  Oral, resp. rate 20, height 5\' 4"  (1.626 m), weight 207 lb 6.4 oz (94.076 kg), last menstrual period 12/20/2006.  LABORATORY DATA: Lab Results  Component Value Date   WBC 5.6 07/04/2012   HGB 14.2 07/04/2012   HCT 42.5 07/04/2012   MCV 87.2 07/04/2012   PLT 212 07/04/2012      Chemistry      Component Value Date/Time   NA 141 07/04/2012 0832   NA 139 12/15/2011 1603   K 4.5 07/04/2012 0832   K 4.2 12/15/2011 1603   CL 101 07/04/2012 0832   CL 99 12/15/2011 1603   CO2 31* 07/04/2012 0832   CO2 31 12/15/2011 1603   BUN 16.0 07/04/2012 0832   BUN 15 12/15/2011 1603   CREATININE 1.0 07/04/2012 0832   CREATININE 0.79 12/15/2011 1603      Component Value Date/Time   CALCIUM 9.2 07/04/2012 0832   CALCIUM 9.2 12/15/2011 1603   ALKPHOS 77 07/04/2012 0832   ALKPHOS 67 12/01/2011 0755   AST 18 07/04/2012 0832   AST 18 12/01/2011 0755   ALT 18 07/04/2012 0832   ALT 17 12/01/2011 0755   BILITOT 0.89 07/04/2012 0832   BILITOT 0.5 12/01/2011 0755     ADDITIONAL INFORMATION: 2. CHROMOGENIC IN-SITU HYBRIDIZATION Interpretation HER-2/NEU BY CISH - NO AMPLIFICATION OF HER-2 DETECTED. THE RATIO OF HER-2: CEP 17 SIGNALS WAS 1.45. Reference range: Ratio: HER2:CEP17 < 1.8 - gene amplification not observed Ratio: HER2:CEP 17 1.8-2.2 - equivocal result Ratio: HER2:CEP17 > 2.2 - gene amplification observed Pecola Leisure MD Pathologist, Electronic Signature ( Signed 01/05/2012) FINAL DIAGNOSIS Diagnosis 1. Lymph node, sentinel, biopsy, Right axillary #1 - ONE LYMPH NODE, NEGATIVE FOR TUMOR (0/1). 2. Breast, lumpectomy, Right - INVASIVE DUCTAL CARCINOMA, GRADE I (1.8 CM). - NO LYMPHOVASCULAR INVASION IDENTIFIED. - INVASIVE TUMOR IS 0.9 CM FROM NEAREST MARGIN (POSTERIOR). - SEE TUMOR SYNOPTIC TEMPLATE BELOW. Microscopic Comment 2. BREAST, INVASIVE TUMOR, WITH LYMPH NODE SAMPLING Specimen, including laterality: Right breast Procedure: Lumpectomy 1 of 3 FINAL for Elizabeth Berg, Elizabeth Berg  ([id number]) Microscopic Comment(continued) Grade: I of III Tubule formation: 1 Nuclear pleomorphism: 1 Mitotic: 1 Tumor size (gross measurement): 1.8 cm Margins: Invasive, distance to closest margin: 0.9 cm In-situ, distance to closest margin: N/A If margin positive, focally or broadly: N/A Lymphovascular invasion: Absent Ductal carcinoma in situ: Absent Grade: N/A Extensive intraductal component: N/A Lobular neoplasia: Absent Tumor focality: Unifocal Treatment effect: None If present, treatment effect in breast tissue, lymph nodes or both: N/A Extent of tumor: Skin: Grossly negative Nipple: N/A Skeletal muscle: N/A Lymph nodes: # examined: 1 Lymph nodes with metastasis: 0 Breast prognostic profile: Estrogen receptor: Not repeated, previous study demonstrated 100% positivity ([id number]). Progesterone receptor: Not repeated, previous study demonstrated 37% positivity Her 2 neu: Repeated, previus study demonstrated no amplification (1.23) ([id number]). Ki-67: Not repeated, previous study demonstrated 90% proliferation rate ([id number]). Non-neoplastic breast: Benign fibroadenomatoid nodules and microcalcifications in benign ducts and lobules. TNM: pT1c, pN0, pMX Comments: None (CR:kh  12-22-11) [place] RUND DO Pathologist, Electronic Signature (Case signed 12/22/2011) Specimen Gross and Clinical Information Specimen(s) Obtained: 1. Lymph node, sentinel, biopsy, Right axillary #1 2. Breast, lumpectomy, Right Specimen Clinical Information 1. Right breast cancer (tl) Gross 1. Rapid Intraoperative Consult performed (Yes or No): No Specimen: Right axillary sentinel lymph node Number and size: 1, 2.5 cm Cut Surface(s): Tan yellow to red, focally firm, focally blue Block Summary: The node is bisected and submitted in one block labeled SLN for routine histology 2. Specimen type: Right lumpectomy, in formalin at 14:20 hours 2 of 3 FINAL for CASONDRA, GASCA  ([id number])  RADIOGRAPHIC STUDIES:  ASSESSMENT: 61 year old female with T1 C. Invasive ductal carcinoma of the right breast status post lumpectomy with sentinel node biopsy. Patient Roseanne Reno was measuring about at 1.8 cm ER/PR positive HER-2/neu negative. I have recommended testing Oncotype DX for recurrence score. Rationale for this was discussed with the patient.  #2 patient has now completed radiation therapy her tumor was ER positive therefore she was recommended antiestrogen therapy with Aromasin 25 mg daily.   PLAN:  #1 Continue  aromasin 25 mg daily  #2 I will see you back in 6 months  All questions were answered. The patient knows to call the clinic with any problems, questions or concerns. We can certainly see the patient much sooner if necessary.  I spent 25 minutes counseling the patient face to face. The total time spent in the appointment was 30 minutes.    Drue Second, MD Medical/Oncology [facility] [phone] (beeper) [phone] (Office)  07/04/2012, 9:40 AM

## 2012-07-05 ENCOUNTER — Other Ambulatory Visit: Payer: BC Managed Care – PPO | Admitting: Lab

## 2012-07-05 ENCOUNTER — Ambulatory Visit: Payer: BC Managed Care – PPO | Admitting: Oncology

## 2012-07-27 ENCOUNTER — Ambulatory Visit (INDEPENDENT_AMBULATORY_CARE_PROVIDER_SITE_OTHER): Payer: BC Managed Care – PPO | Admitting: Surgery

## 2012-07-27 ENCOUNTER — Encounter (INDEPENDENT_AMBULATORY_CARE_PROVIDER_SITE_OTHER): Payer: Self-pay | Admitting: Surgery

## 2012-07-27 ENCOUNTER — Other Ambulatory Visit (INDEPENDENT_AMBULATORY_CARE_PROVIDER_SITE_OTHER): Payer: Self-pay

## 2012-07-27 VITALS — BP 140/90 | HR 74 | Temp 98.0°F | Resp 16 | Ht 64.0 in | Wt 210.8 lb

## 2012-07-27 DIAGNOSIS — C50319 Malignant neoplasm of lower-inner quadrant of unspecified female breast: Secondary | ICD-10-CM

## 2012-07-27 NOTE — Progress Notes (Signed)
Re:   Elizabeth Berg DOB:   08-Feb-1951 MRN:   [id number]  [facility]  ASSESSMENT AND PLAN: 1.  Right breast cancer.  T1c, N0.  Final path: 1.8 cm IDC, 0/1 nodes.  Lumpectomy and SLNBx - 12/21/2011.  ER - 100, PR - 16,. Ki67 - 19%, Her2Neu - neg  Drs. Welton Flakes and Michell Heinrich are treating oncologist.  She has completed rad tx.  She is now on Exemestane by Dr. Welton Flakes  I will see her back in 6 months.  2.  Thyroid replacement.  Had total thyroidectomy by Dr. Gerrit Friends - 2008. 3.  History of palpitations. 4.  Hypertension. 5.  Hypercholesterolemia. 6.  GERD.  REFERRING PHYSICIAN:   Dr. Gillermina Phy  HISTORY OF PRESENT ILLNESS: Elizabeth Berg is a 62 y.o. (DOB: [birth year])  white female whose primary care physician is Dr. Gillermina Phy. She is somewhat reserved.  She come for follow up of right breast cancer.  She has some pain in her right axilla.  No other concerns.   Past Medical History  Diagnosis Date  . H/O colonoscopy 04/14/09  . H/O bone density study   . Wears glasses   . Heart burn   . Hypertension   . 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 GERD (gastroesophageal reflux disease)   . Palpitations   . Hypercholesterolemia     Current Outpatient Prescriptions  Medication Sig Dispense Refill  . ACCU-CHEK AVIVA PLUS test strip       . cefUROXime (CEFTIN) 500 MG tablet       . exemestane (AROMASIN) 25 MG tablet Take 1 tablet (25 mg total) by mouth daily after breakfast.  90 tablet  6  . fluticasone (FLONASE) 50 MCG/ACT nasal spray       . lisinopril (PRINIVIL,ZESTRIL) 10 MG tablet Take 10 mg by mouth daily.      . non-metallic deodorant Thornton Papas) MISC Apply 1 application topically daily as needed.      Marland Kitchen omeprazole (PRILOSEC) 20 MG capsule Take 20 mg by mouth daily.      . rosuvastatin (CRESTOR) 10 MG tablet Take 10 mg by mouth daily.      . verapamil (CALAN) 120 MG tablet Take 120 mg by mouth daily.      Marland Kitchen levothyroxine (SYNTHROID, LEVOTHROID) 137 MCG  tablet Take 137 mcg by mouth daily.      . Wound Cleansers (RADIAPLEX EX) Apply topically.         Allergies  Allergen Reactions  . Penicillins     Childhood allergy; reaction unknown    REVIEW OF SYSTEMS: Cardiac:  Sees Dr. Royann Shivers, [facility], for hypertension and palpitations. Endocrine:  No diabetes. Sees Dr. Talmage Nap for thyroid replacement.  Had a total thyroidectomy by Dr. Gerrit Friends in 2008. Gastrointestinal:  Has reflux disease and had an EGD last in 2010.  Mother had esophageal ca.  No history of liver disease.  Had lap chole by Dr. Woodfin Ganja in the [phone].  No history of pancreas disease.  Colonoscopy by Dr. Loreta Ave 2010.  SOCIAL and FAMILY HISTORY: Married.  She works with Psychologist, sport and exercise. She has two sons - 51 and 41.  PHYSICAL EXAM: BP 140/90  Pulse 74  Temp 98 F (36.7 C) (Temporal)  Resp 16  Ht 5\' 4"  (1.626 m)  Wt 210 lb 12.8 oz (95.618 kg)  BMI 36.18 kg/m2  LMP 12/20/2006  General: WN WF who is alert and  generally healthy appearing.  Lymph Nodes:  No supraclavicular, cervical nodes, or axillary nodes. Breast:  Right: Incision at 12 o'clock and right axilla look good.  She has some pigmentation of her right breast.  Left:  No mass or area of concern. Extremities:  No evidence of lymphedema.  DATA REVIEWED: None new.  Ovidio Kin, MD,  [facility] [address] [place]  [address]   [place], [place]    [address] Phone:  [phone] FAX:  [phone]

## 2012-09-02 ENCOUNTER — Other Ambulatory Visit: Payer: Self-pay

## 2012-10-18 ENCOUNTER — Other Ambulatory Visit: Payer: Self-pay | Admitting: Obstetrics and Gynecology

## 2012-10-18 DIAGNOSIS — Z9889 Other specified postprocedural states: Secondary | ICD-10-CM

## 2012-10-18 DIAGNOSIS — Z853 Personal history of malignant neoplasm of breast: Secondary | ICD-10-CM

## 2012-11-10 ENCOUNTER — Ambulatory Visit
Admission: RE | Admit: 2012-11-10 | Discharge: 2012-11-10 | Disposition: A | Discharge status: Home / Self Care | Payer: BC Managed Care – PPO | Source: Ambulatory Visit | Attending: Obstetrics and Gynecology | Admitting: Obstetrics and Gynecology

## 2012-11-10 DIAGNOSIS — Z9889 Other specified postprocedural states: Secondary | ICD-10-CM

## 2012-11-10 DIAGNOSIS — Z853 Personal history of malignant neoplasm of breast: Secondary | ICD-10-CM

## 2013-01-04 ENCOUNTER — Telehealth: Payer: Self-pay | Admitting: *Deleted

## 2013-01-04 NOTE — Telephone Encounter (Signed)
I called patient to introduce myself as the [employer].  Patient has an appointment tomorrow morning and I told her that I would meet her at that time and give her my contact information.

## 2013-01-05 ENCOUNTER — Encounter: Payer: Self-pay | Admitting: *Deleted

## 2013-01-05 ENCOUNTER — Ambulatory Visit (HOSPITAL_BASED_OUTPATIENT_CLINIC_OR_DEPARTMENT_OTHER): Payer: BC Managed Care – PPO | Admitting: Oncology

## 2013-01-05 ENCOUNTER — Other Ambulatory Visit (HOSPITAL_BASED_OUTPATIENT_CLINIC_OR_DEPARTMENT_OTHER): Payer: BC Managed Care – PPO | Admitting: Lab

## 2013-01-05 ENCOUNTER — Telehealth: Payer: Self-pay | Admitting: Oncology

## 2013-01-05 ENCOUNTER — Encounter: Payer: Self-pay | Admitting: Oncology

## 2013-01-05 VITALS — BP 144/85 | HR 75 | Temp 98.6°F | Resp 20 | Ht 64.0 in | Wt 210.7 lb

## 2013-01-05 DIAGNOSIS — C50319 Malignant neoplasm of lower-inner quadrant of unspecified female breast: Secondary | ICD-10-CM

## 2013-01-05 DIAGNOSIS — C50219 Malignant neoplasm of upper-inner quadrant of unspecified female breast: Secondary | ICD-10-CM

## 2013-01-05 DIAGNOSIS — C50911 Malignant neoplasm of unspecified site of right female breast: Secondary | ICD-10-CM

## 2013-01-05 DIAGNOSIS — C50919 Malignant neoplasm of unspecified site of unspecified female breast: Secondary | ICD-10-CM

## 2013-01-05 LAB — CBC WITH DIFFERENTIAL/PLATELET
BASO%: 0.7 % (ref 0.0–2.0)
Basophils Absolute: 0 10*3/uL (ref 0.0–0.1)
Eosinophils Absolute: 0.1 10*3/uL (ref 0.0–0.5)
HCT: 42.8 % (ref 34.8–46.6)
HGB: 14.6 g/dL (ref 11.6–15.9)
MCHC: 34.1 g/dL (ref 31.5–36.0)
MONO#: 0.5 10*3/uL (ref 0.1–0.9)
NEUT#: 4.5 10*3/uL (ref 1.5–6.5)
NEUT%: 70 % (ref 38.4–76.8)
Platelets: 215 10*3/uL (ref 145–400)
WBC: 6.4 10*3/uL (ref 3.9–10.3)
lymph#: 1.3 10*3/uL (ref 0.9–3.3)

## 2013-01-05 LAB — COMPREHENSIVE METABOLIC PANEL (CC13)
ALT: 14 U/L (ref 0–55)
CO2: 27 mEq/L (ref 22–29)
Calcium: 9.4 mg/dL (ref 8.4–10.4)
Chloride: 104 mEq/L (ref 98–107)
Creatinine: 0.9 mg/dL (ref 0.6–1.1)
Glucose: 104 mg/dl — ABNORMAL HIGH (ref 70–99)

## 2013-01-05 MED ORDER — EXEMESTANE 25 MG PO TABS: 25.0000 mg | ORAL_TABLET | Freq: Every day | ORAL | Status: DC

## 2013-01-05 NOTE — Patient Instructions (Addendum)
Doing well no evidence of recurrent cancer  Exercise and eat healthy to maintain a good weight and mental outlook!!!  I will see you back in 6 months

## 2013-01-05 NOTE — Progress Notes (Signed)
Patient at [facility] to see Dr. Welton Flakes for f/o right breast cancer.  I met with patient to introduce myself in person as the [employer].  Patient reported that she is doing well.  She denied any questions or concerns at this time. Patient given my card with contact information and encouraged to call for any concerns or needs.  Patient verbalized understanding.

## 2013-01-22 ENCOUNTER — Telehealth: Payer: Self-pay | Admitting: *Deleted

## 2013-01-22 NOTE — Telephone Encounter (Signed)
Called patient to remind her of her appointment with Dr. Ezzard Standing tomorrow.  Patient reports that she is doing well and denies any questions or concerns at this time.  Patient verified that she has my contact information and was encouraged to call.

## 2013-01-23 ENCOUNTER — Ambulatory Visit (INDEPENDENT_AMBULATORY_CARE_PROVIDER_SITE_OTHER): Payer: BC Managed Care – PPO | Admitting: Surgery

## 2013-01-23 VITALS — BP 124/70 | HR 60 | Temp 98.0°F | Resp 18 | Ht 64.0 in | Wt 212.0 lb

## 2013-01-23 DIAGNOSIS — C50311 Malignant neoplasm of lower-inner quadrant of right female breast: Secondary | ICD-10-CM

## 2013-01-23 DIAGNOSIS — C50319 Malignant neoplasm of lower-inner quadrant of unspecified female breast: Secondary | ICD-10-CM

## 2013-01-23 NOTE — Progress Notes (Signed)
Re:   Elizabeth Berg DOB:   04-Jan-1951 MRN:   [id number]  [facility]  ASSESSMENT AND PLAN: 1.  Right breast cancer.  T1c, N0.  Final path: 1.8 cm IDC, 0/1 nodes.  Lumpectomy and SLNBx - 12/21/2011.  ER - 100, PR - 16,. Ki67 - 19%, Her2Neu - neg  Drs. Welton Flakes and Michell Heinrich are treating oncologist.  She has completed rad tx.  She is now on Exemestane by Dr. Welton Flakes  I will see her back in 6 months.  2.  Thyroid replacement.  Had total thyroidectomy by Dr. Gerrit Friends - 2008. 3.  History of palpitations. 4.  Hypertension. 5.  Hypercholesterolemia. 6.  GERD.  REFERRING PHYSICIAN:   Dr. Gillermina Phy  HISTORY OF PRESENT ILLNESS: Elizabeth Berg is a 62 y.o. (DOB: Jun 21, 1951)  white female whose primary care physician is Dr. Gillermina Phy. She is somewhat reserved.  She comes for follow up of right breast cancer.  She is by herself.  She has some pain in her right axilla.  No other concerns.  She is not having any symptoms from the Exemestane.   Past Medical History  Diagnosis Date  . H/O colonoscopy 04/14/09  . H/O bone density study   . Wears glasses   . Heart burn   . Hypertension   . 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 GERD (gastroesophageal reflux disease)   . Palpitations   . Hypercholesterolemia     Current Outpatient Prescriptions  Medication Sig Dispense Refill  . exemestane (AROMASIN) 25 MG tablet Take 1 tablet (25 mg total) by mouth daily after breakfast.  90 tablet  12  . levothyroxine (SYNTHROID, LEVOTHROID) 137 MCG tablet Take 137 mcg by mouth daily.      Marland Kitchen lisinopril (PRINIVIL,ZESTRIL) 10 MG tablet Take 10 mg by mouth daily.      Marland Kitchen omeprazole (PRILOSEC) 20 MG capsule Take 20 mg by mouth daily.      . ONE TOUCH ULTRA TEST test strip       . rosuvastatin (CRESTOR) 10 MG tablet Take 10 mg by mouth daily.      . verapamil (CALAN) 120 MG tablet Take 120 mg by mouth daily.       No current facility-administered medications for this visit.      Allergies  Allergen Reactions  . Penicillins     Childhood allergy; reaction unknown    REVIEW OF SYSTEMS: Cardiac:  Sees Dr. Royann Shivers, [facility], for hypertension and palpitations. Endocrine:  No diabetes. Sees Dr. Talmage Nap for thyroid replacement.  Had a total thyroidectomy by Dr. Gerrit Friends in 2008. Gastrointestinal:  Has reflux disease and had an EGD last in 2010.  Mother had esophageal ca.  No history of liver disease.  Had lap chole by Dr. Woodfin Ganja in the [phone].  No history of pancreas disease.  Colonoscopy by Dr. Loreta Ave 2010.  SOCIAL and FAMILY HISTORY: Married.  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  PHYSICAL EXAM: BP 124/70  Pulse 60  Temp(Src) 98 F (36.7 C)  Resp 18  Ht 5\' 4"  (1.626 m)  Wt 212 lb (96.163 kg)  BMI 36.37 kg/m2  LMP 12/20/2006  General: WN WF who is alert and generally healthy appearing.  Lymph Nodes:  No supraclavicular, cervical nodes, or axillary nodes. Breast:  Right: Incision at 12 o'clock and right axilla look good.  She still has some pigmentation  of her right breast.  Left:  No mass or area of concern. Extremities:  No evidence of lymphedema.  DATA REVIEWED: Mammograms ([facility]) - 11/10/2012 - negative  Ovidio Kin, MD,  [facility], [place] [address]  [address]   [place], [place]    [address] Phone:  [phone] FAX:  [phone]

## 2013-01-28 NOTE — Progress Notes (Signed)
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  PRIOR THERAPY:   #1 patient was originally seen in the multidisciplinary breast clinic since then she has gone on to see a right lumpectomy with sentinel node biopsy. Her final pathology showed an invasive ductal carcinoma that was grade one measuring 1.8 cm no evidence of lymphovascular invasion. All margins were negative tumor was ER +100% PR +37% HER-2/neu negative would Ki-67 of 9% grade 1.. One sentinel node was negative for metastatic disease.  #2 we had planned on doing Oncotype DX testing for the 1.8 cm tumor but there is not enough tissue and therefore this was not done.  #3 patient has now completed radiation therapy..  #4 patient will begin antiestrogen therapy with Aromasin 25 mg daily. This will start today 04/05/2012. A total of 5 years of therapy is planned.  Patient understands the risks and benefits and followup.  CURRENT THERAPY:Aromasin 25 mg daily  INTERVAL HISTORY: Elizabeth Berg 62 y.o. female returns for Followup visit Today.clinically she seems to be doing well. We discussed exercise and healthy eating today. She has no nausea vomiting fevers chills night sweats headaches shortness of breath chest pains or palpitations no myalgias and arthralgias no performed paresthesias. Remainder of the 10 point review of systems is negative. MEDICAL HISTORY: Past Medical History  Diagnosis Date  . H/O colonoscopy 04/14/09  . H/O bone density study   . Wears glasses   . Heart burn   . Hypertension   . 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 GERD (gastroesophageal reflux disease)   . Palpitations   . Hypercholesterolemia     ALLERGIES:  is allergic to  penicillins.  MEDICATIONS:  Current Outpatient Prescriptions  Medication Sig Dispense Refill  . exemestane (AROMASIN) 25 MG tablet Take 1 tablet (25 mg total) by mouth daily after breakfast.  90 tablet  12  . levothyroxine (SYNTHROID, LEVOTHROID) 137 MCG tablet Take 137 mcg by mouth daily.      Marland Kitchen lisinopril (PRINIVIL,ZESTRIL) 10 MG tablet Take 10 mg by mouth daily.      Marland Kitchen omeprazole (PRILOSEC) 20 MG capsule Take 20 mg by mouth daily.      . rosuvastatin (CRESTOR) 10 MG tablet Take 10 mg by mouth daily.      . verapamil (CALAN) 120 MG tablet Take 120 mg by mouth daily.      . ONE TOUCH ULTRA TEST test strip        No current facility-administered medications for this visit.    SURGICAL HISTORY:  Past Surgical History  Procedure Laterality Date  . Cholecystectomy    . Thyroid surgery    . Breast biopsy  11/22/2011    right breast biopsy  . Colonoscopy    . Bone density study    . Total thyroidectomy  2008    Dr. Gerrit Friends  . Breast lumpectomy  12/20/2011    right breast lumpectomy and right axillary lymph node biopsy    REVIEW OF SYSTEMS:  A comprehensive review of systems was negative.   PHYSICAL EXAMINATION: General appearance: alert, cooperative and appears stated age Neck: no adenopathy, no carotid bruit, no JVD, supple, symmetrical, trachea midline and thyroid not enlarged, symmetric, no tenderness/mass/nodules Lymph nodes: Cervical,  supraclavicular, and axillary nodes normal. Resp: clear to auscultation bilaterally and normal percussion bilaterally Back: symmetric, no curvature. ROM normal. No CVA tenderness. Cardio: regular rate and rhythm, S1, S2 normal, no murmur, click, rub or gallop GI: soft, non-tender; bowel sounds normal; no masses,  no organomegaly Extremities: extremities normal, atraumatic, no cyanosis or edema Neurologic: Grossly normal Breast examination right breast reveals healing incisional scar no masses no nodularity no nipple discharge left breast no masses  or nipple discharge. ECOG PERFORMANCE STATUS: 0 - Asymptomatic  Blood pressure 144/85, pulse 75, temperature 98.6 F (37 C), temperature source Oral, resp. rate 20, height 5\' 4"  (1.626 m), weight 210 lb 11.2 oz (95.573 kg), last menstrual period 12/20/2006.  LABORATORY DATA: Lab Results  Component Value Date   WBC 6.4 01/05/2013   HGB 14.6 01/05/2013   HCT 42.8 01/05/2013   MCV 84.4 01/05/2013   PLT 215 01/05/2013      Chemistry      Component Value Date/Time   NA 142 01/05/2013 0822   NA 139 12/15/2011 1603   K 4.3 01/05/2013 0822   K 4.2 12/15/2011 1603   CL 104 01/05/2013 0822   CL 99 12/15/2011 1603   CO2 27 01/05/2013 0822   CO2 31 12/15/2011 1603   BUN 18.6 01/05/2013 0822   BUN 15 12/15/2011 1603   CREATININE 0.9 01/05/2013 0822   CREATININE 0.79 12/15/2011 1603      Component Value Date/Time   CALCIUM 9.4 01/05/2013 0822   CALCIUM 9.2 12/15/2011 1603   ALKPHOS 81 01/05/2013 0822   ALKPHOS 67 12/01/2011 0755   AST 14 01/05/2013 0822   AST 18 12/01/2011 0755   ALT 14 01/05/2013 0822   ALT 17 12/01/2011 0755   BILITOT 0.56 01/05/2013 0822   BILITOT 0.5 12/01/2011 0755     ADDITIONAL INFORMATION: 2. CHROMOGENIC IN-SITU HYBRIDIZATION Interpretation HER-2/NEU BY CISH - NO AMPLIFICATION OF HER-2 DETECTED. THE RATIO OF HER-2: CEP 17 SIGNALS WAS 1.45. Reference range: Ratio: HER2:CEP17 < 1.8 - gene amplification not observed Ratio: HER2:CEP 17 1.8-2.2 - equivocal result Ratio: HER2:CEP17 > 2.2 - gene amplification observed Pecola Leisure MD Pathologist, Electronic Signature ( Signed 01/05/2012) FINAL DIAGNOSIS Diagnosis 1. Lymph node, sentinel, biopsy, Right axillary #1 - ONE LYMPH NODE, NEGATIVE FOR TUMOR (0/1). 2. Breast, lumpectomy, Right - INVASIVE DUCTAL CARCINOMA, GRADE I (1.8 CM). - NO LYMPHOVASCULAR INVASION IDENTIFIED. - INVASIVE TUMOR IS 0.9 CM FROM NEAREST MARGIN (POSTERIOR). - SEE TUMOR SYNOPTIC TEMPLATE BELOW. Microscopic Comment 2. BREAST, INVASIVE TUMOR, WITH LYMPH  NODE SAMPLING Specimen, including laterality: Right breast Procedure: Lumpectomy 1 of 3 FINAL for MARIONETTE, MESKILL ([id number]) Microscopic Comment(continued) Grade: I of III Tubule formation: 1 Nuclear pleomorphism: 1 Mitotic: 1 Tumor size (gross measurement): 1.8 cm Margins: Invasive, distance to closest margin: 0.9 cm In-situ, distance to closest margin: N/A If margin positive, focally or broadly: N/A Lymphovascular invasion: Absent Ductal carcinoma in situ: Absent Grade: N/A Extensive intraductal component: N/A Lobular neoplasia: Absent Tumor focality: Unifocal Treatment effect: None If present, treatment effect in breast tissue, lymph nodes or both: N/A Extent of tumor: Skin: Grossly negative Nipple: N/A Skeletal muscle: N/A Lymph nodes: # examined: 1 Lymph nodes with metastasis: 0 Breast prognostic profile: Estrogen receptor: Not repeated, previous study demonstrated 100% positivity ([id number]). Progesterone receptor: Not repeated, previous study demonstrated 37% positivity Her 2 neu: Repeated, previus study demonstrated no amplification (1.23) ([id number]). Ki-67: Not repeated, previous study demonstrated 90% proliferation rate ([id number]). Non-neoplastic breast: Benign fibroadenomatoid nodules and microcalcifications in  benign ducts and lobules. TNM: pT1c, pN0, pMX Comments: None (CR:kh 12-22-11) [place] RUND DO Pathologist, Electronic Signature (Case signed 12/22/2011) Specimen Gross and Clinical Information Specimen(s) Obtained: 1. Lymph node, sentinel, biopsy, Right axillary #1 2. Breast, lumpectomy, Right Specimen Clinical Information 1. Right breast cancer (tl) Gross 1. Rapid Intraoperative Consult performed (Yes or No): No Specimen: Right axillary sentinel lymph node Number and size: 1, 2.5 cm Cut Surface(s): Tan yellow to red, focally firm, focally blue Block Summary: The node is bisected and submitted in one block labeled SLN for routine  histology 2. Specimen type: Right lumpectomy, in formalin at 14:20 hours 2 of 3 FINAL for RAELYNNE, LUDWICK ([id number])  RADIOGRAPHIC STUDIES:  ASSESSMENT: 62 year old female with T1 C. Invasive ductal carcinoma of the right breast status post lumpectomy with sentinel node biopsy. Patient Elizabeth Berg was measuring about at 1.8 cm ER/PR positive HER-2/neu negative. I have recommended testing Oncotype DX for recurrence score. Rationale for this was discussed with the patient.  #2 patient has now completed radiation therapy her tumor was ER positive therefore she was recommended antiestrogen therapy with Aromasin 25 mg daily.   PLAN:  #1 Continue  aromasin 25 mg daily  #2 Doing well no evidence of recurrent cancer  #3 Exercise and eat healthy to maintain a good weight and mental outlook!!!  #4 I will see you back in 6 months  All questions were answered. The patient knows to call the clinic with any problems, questions or concerns. We can certainly see the patient much sooner if necessary.  I spent 25 minutes counseling the patient face to face. The total time spent in the appointment was 30 minutes.    Drue Second, MD Medical/Oncology [facility] [phone] (beeper) [phone] (Office)

## 2013-05-21 ENCOUNTER — Other Ambulatory Visit: Payer: Self-pay | Admitting: Obstetrics and Gynecology

## 2013-05-24 ENCOUNTER — Other Ambulatory Visit: Payer: Self-pay

## 2013-06-29 ENCOUNTER — Other Ambulatory Visit (HOSPITAL_BASED_OUTPATIENT_CLINIC_OR_DEPARTMENT_OTHER): Payer: BC Managed Care – PPO

## 2013-06-29 ENCOUNTER — Encounter: Payer: Self-pay | Admitting: Oncology

## 2013-06-29 ENCOUNTER — Telehealth: Payer: Self-pay | Admitting: *Deleted

## 2013-06-29 ENCOUNTER — Ambulatory Visit (HOSPITAL_BASED_OUTPATIENT_CLINIC_OR_DEPARTMENT_OTHER): Payer: BC Managed Care – PPO | Admitting: Oncology

## 2013-06-29 VITALS — BP 124/83 | HR 71 | Temp 98.9°F | Resp 19 | Ht 64.0 in | Wt 210.0 lb

## 2013-06-29 DIAGNOSIS — C50311 Malignant neoplasm of lower-inner quadrant of right female breast: Secondary | ICD-10-CM

## 2013-06-29 DIAGNOSIS — C50319 Malignant neoplasm of lower-inner quadrant of unspecified female breast: Secondary | ICD-10-CM

## 2013-06-29 DIAGNOSIS — Z170 Estrogen receptor positive status [ER+]: Secondary | ICD-10-CM

## 2013-06-29 DIAGNOSIS — C50911 Malignant neoplasm of unspecified site of right female breast: Secondary | ICD-10-CM

## 2013-06-29 DIAGNOSIS — C50919 Malignant neoplasm of unspecified site of unspecified female breast: Secondary | ICD-10-CM

## 2013-06-29 LAB — CBC WITH DIFFERENTIAL/PLATELET
BASO%: 0.5 % (ref 0.0–2.0)
Eosinophils Absolute: 0.1 10*3/uL (ref 0.0–0.5)
HCT: 44.5 % (ref 34.8–46.6)
MCHC: 33.6 g/dL (ref 31.5–36.0)
MONO#: 0.5 10*3/uL (ref 0.1–0.9)
NEUT#: 5.1 10*3/uL (ref 1.5–6.5)
NEUT%: 71.2 % (ref 38.4–76.8)
RBC: 5.22 10*6/uL (ref 3.70–5.45)
WBC: 7.1 10*3/uL (ref 3.9–10.3)
lymph#: 1.5 10*3/uL (ref 0.9–3.3)

## 2013-06-29 LAB — COMPREHENSIVE METABOLIC PANEL (CC13)
ALT: 18 U/L (ref 0–55)
Albumin: 4 g/dL (ref 3.5–5.0)
Anion Gap: 11 mEq/L (ref 3–11)
CO2: 27 mEq/L (ref 22–29)
Calcium: 9.7 mg/dL (ref 8.4–10.4)
Chloride: 103 mEq/L (ref 98–109)
Sodium: 141 mEq/L (ref 136–145)
Total Protein: 7.8 g/dL (ref 6.4–8.3)

## 2013-06-29 IMAGING — CR DG CHEST 2V
2 series · 2 of 2 positions shown · non-contrast
Comparison: PA and lateral chest.

CLINICAL DATA: Preoperative respiratory films.

CHEST - 2 VIEW

[view not recorded (1 of 2)]
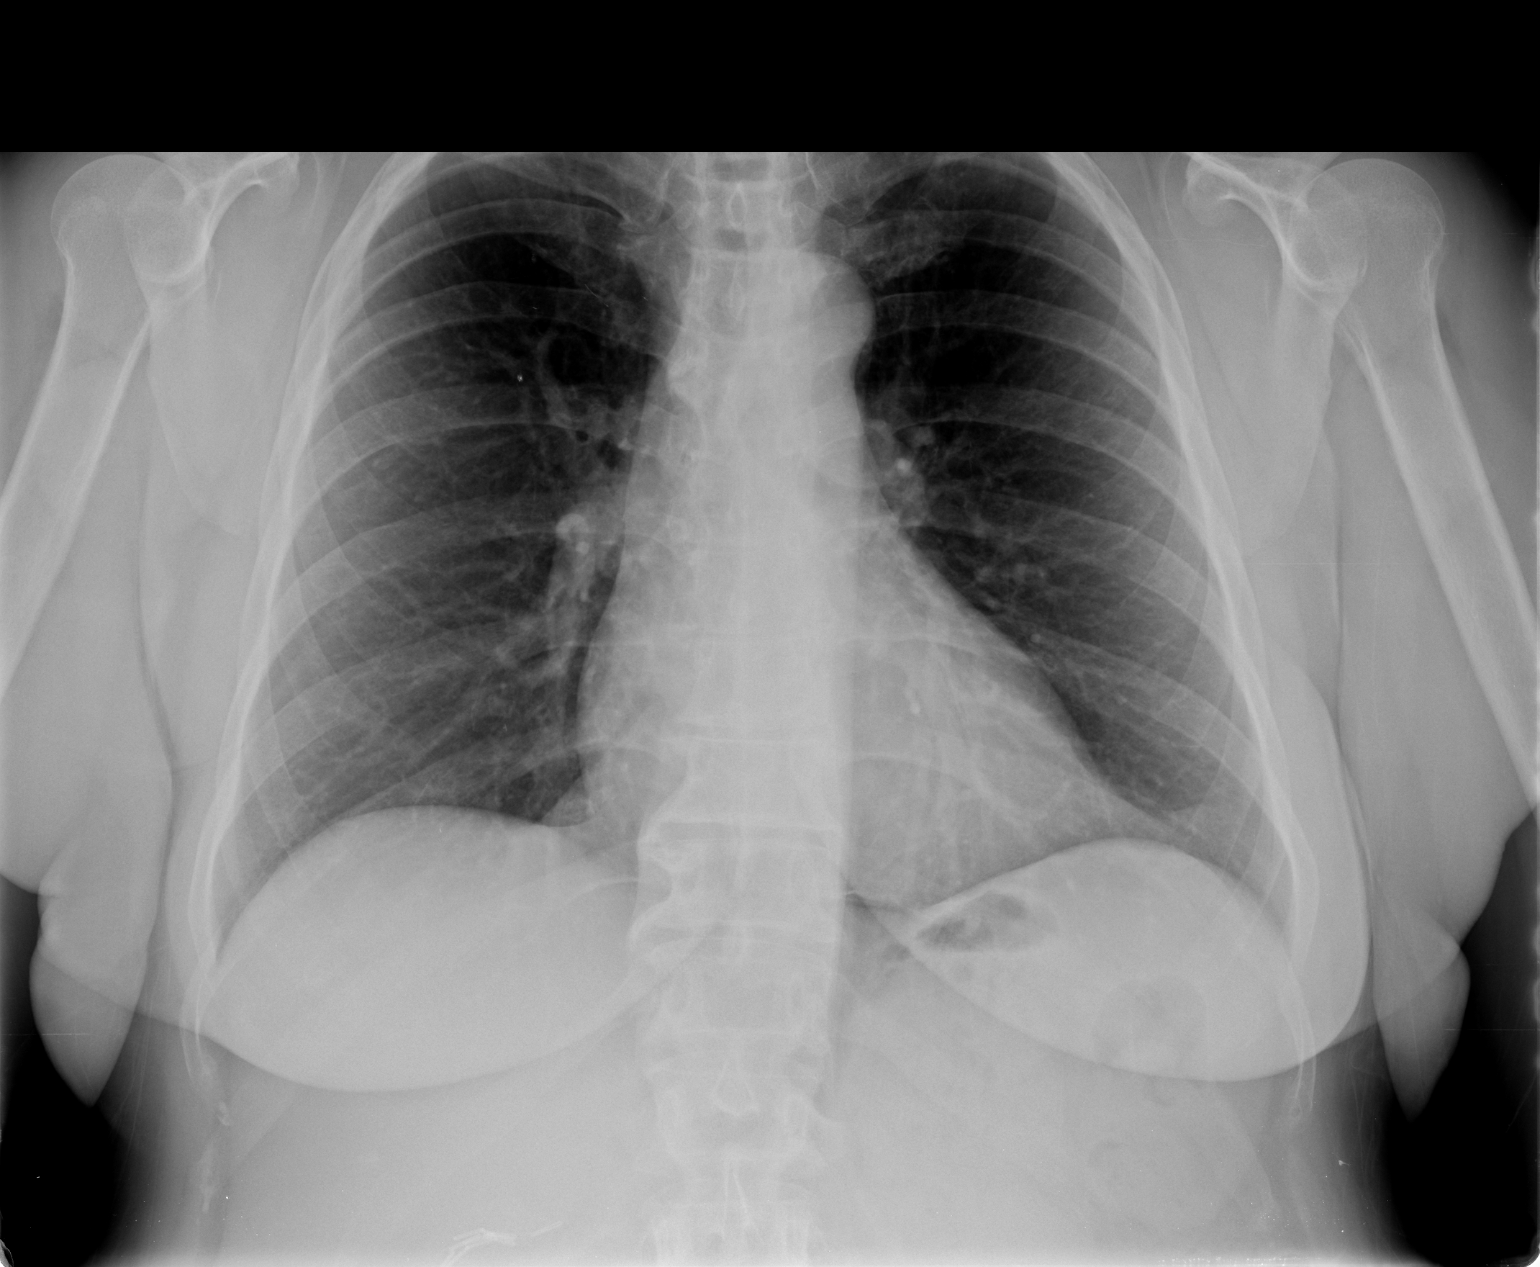

[view not recorded (2 of 2)]
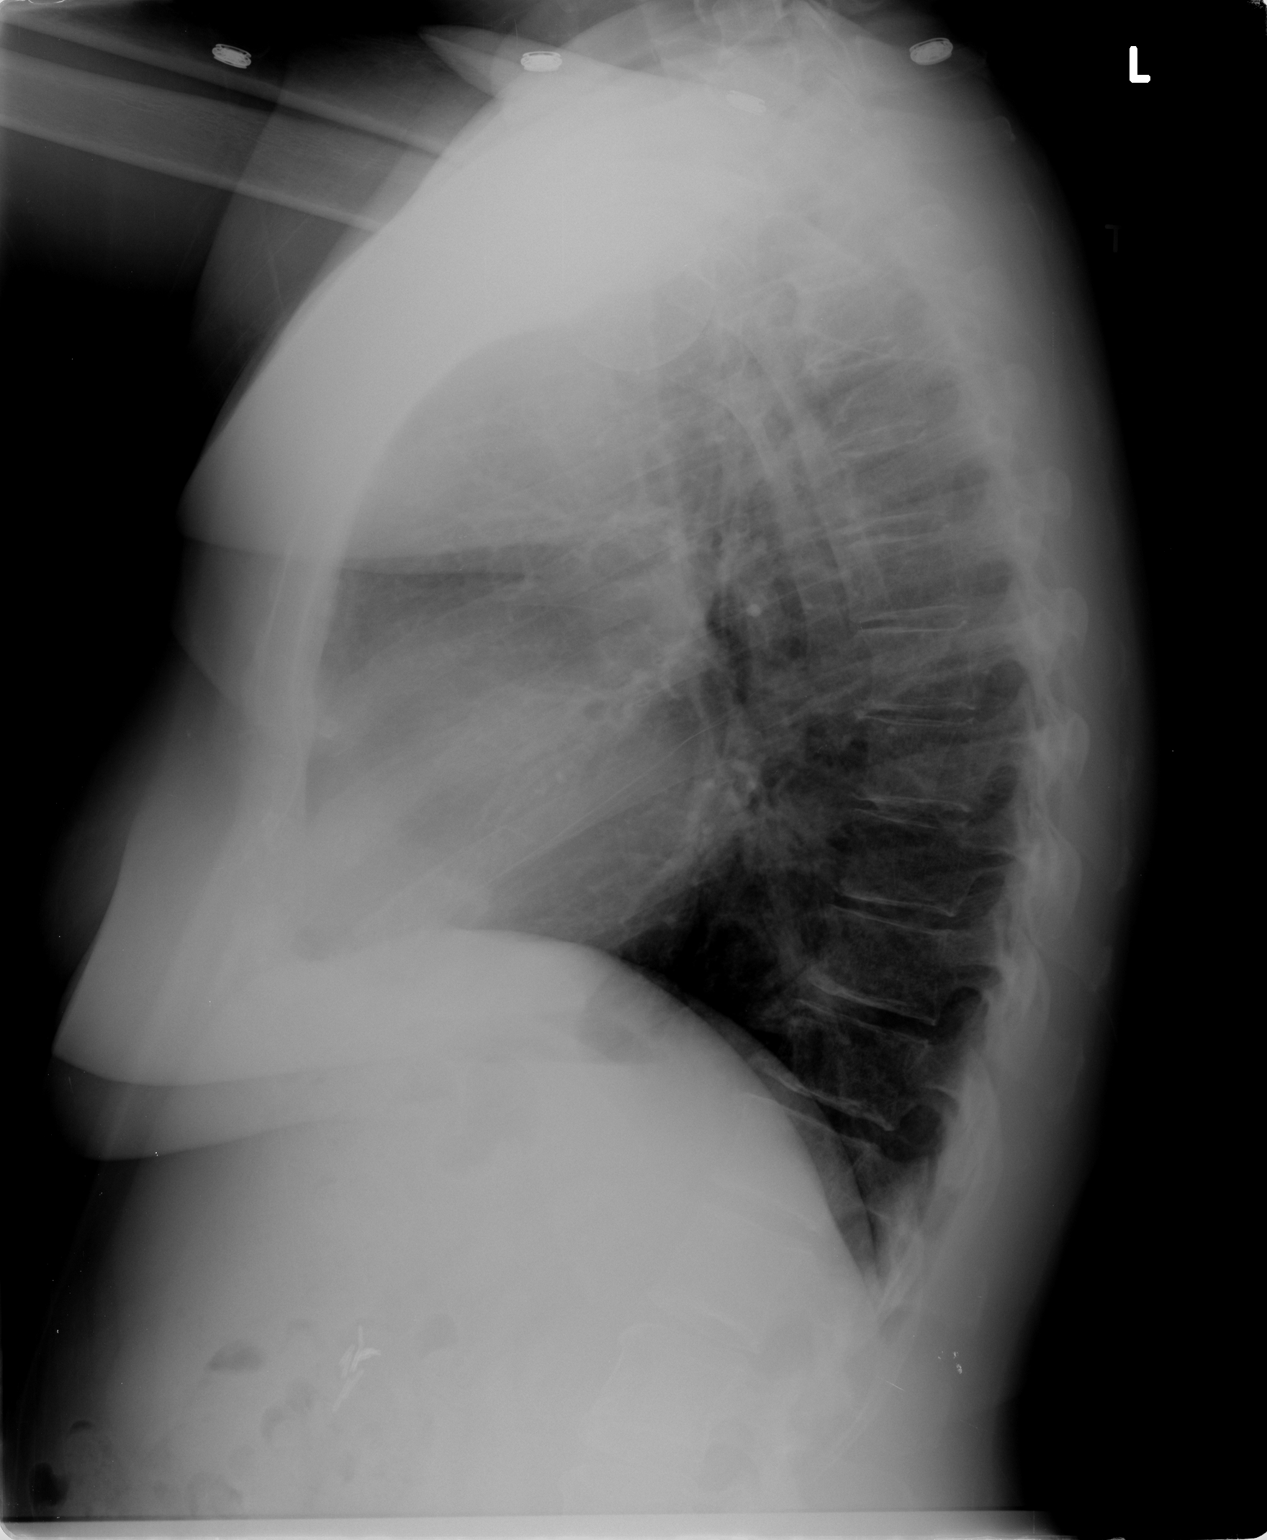

[2 of 2 positions shown; findings below may reference images not displayed]

FINDINGS: Lungs clear. Heart size normal. No pneumothorax or
pleural effusion.
IMPRESSION: Negative chest.

## 2013-06-29 NOTE — Telephone Encounter (Signed)
appts made and printed...td 

## 2013-06-29 NOTE — Progress Notes (Signed)
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  PRIOR THERAPY:   #1 patient was originally seen in the multidisciplinary breast clinic since then she has gone on to see a right lumpectomy with sentinel node biopsy. Her final pathology showed an invasive ductal carcinoma that was grade one measuring 1.8 cm no evidence of lymphovascular invasion. All margins were negative tumor was ER +100% PR +37% HER-2/neu negative would Ki-67 of 9% grade 1.. One sentinel node was negative for metastatic disease.  #2 we had planned on doing Oncotype DX testing for the 1.8 cm tumor but there is not enough tissue and therefore this was not done.  #3 patient has now completed radiation therapy..  #4 patient will begin antiestrogen therapy with Aromasin 25 mg daily. This will start today 04/05/2012. A total of 5 years of therapy is planned.  Patient understands the risks and benefits and followup.  CURRENT THERAPY:Aromasin 25 mg daily  INTERVAL HISTORY: Elizabeth Berg 62 y.o. female returns for Followup visit Today.clinically she seems to be doing well. We discussed exercise and healthy eating today. She has no nausea vomiting fevers chills night sweats headaches shortness of breath chest pains or palpitations no myalgias and arthralgias no performed paresthesias. Remainder of the 10 point review of systems is negative. MEDICAL HISTORY: Past Medical History  Diagnosis Date  . H/O colonoscopy 04/14/09  . H/O bone density study   . Wears glasses   . Heart burn   . Hypertension   . 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 GERD (gastroesophageal reflux disease)   . Palpitations   . Hypercholesterolemia     ALLERGIES:  is allergic to  penicillins.  MEDICATIONS:  Current Outpatient Prescriptions  Medication Sig Dispense Refill  . exemestane (AROMASIN) 25 MG tablet Take 1 tablet (25 mg total) by mouth daily after breakfast.  90 tablet  12  . levothyroxine (SYNTHROID, LEVOTHROID) 137 MCG tablet Take 137 mcg by mouth daily.      Marland Kitchen lisinopril (PRINIVIL,ZESTRIL) 10 MG tablet Take 10 mg by mouth daily.      Marland Kitchen omeprazole (PRILOSEC) 20 MG capsule Take 20 mg by mouth daily.      . ONE TOUCH ULTRA TEST test strip       . rosuvastatin (CRESTOR) 10 MG tablet Take 10 mg by mouth daily.      . verapamil (CALAN) 120 MG tablet Take 120 mg by mouth daily.       No current facility-administered medications for this visit.    SURGICAL HISTORY:  Past Surgical History  Procedure Laterality Date  . Cholecystectomy    . Thyroid surgery    . Breast biopsy  11/22/2011    right breast biopsy  . Colonoscopy    . Bone density study    . Total thyroidectomy  2008    Dr. Gerrit Friends  . Breast lumpectomy  12/20/2011    right breast lumpectomy and right axillary lymph node biopsy    REVIEW OF SYSTEMS:  A comprehensive review of systems was negative.   PHYSICAL EXAMINATION: General appearance: alert, cooperative and appears stated age Neck: no adenopathy, no carotid bruit, no JVD, supple, symmetrical, trachea midline and thyroid not enlarged, symmetric, no tenderness/mass/nodules Lymph nodes: Cervical,  supraclavicular, and axillary nodes normal. Resp: clear to auscultation bilaterally and normal percussion bilaterally Back: symmetric, no curvature. ROM normal. No CVA tenderness. Cardio: regular rate and rhythm, S1, S2 normal, no murmur, click, rub or gallop GI: soft, non-tender; bowel sounds normal; no masses,  no organomegaly Extremities: extremities normal, atraumatic, no cyanosis or edema Neurologic: Grossly normal Breast examination right breast reveals healing incisional scar no masses no nodularity no nipple discharge left breast no masses  or nipple discharge. ECOG PERFORMANCE STATUS: 0 - Asymptomatic  Blood pressure 124/83, pulse 71, temperature 98.9 F (37.2 C), temperature source Oral, resp. rate 19, height 5\' 4"  (1.626 m), weight 210 lb (95.255 kg), last menstrual period 12/20/2006.  LABORATORY DATA: Lab Results  Component Value Date   WBC 7.1 06/29/2013   HGB 15.0 06/29/2013   HCT 44.5 06/29/2013   MCV 85.3 06/29/2013   PLT 226 06/29/2013      Chemistry      Component Value Date/Time   NA 141 06/29/2013 0842   NA 139 12/15/2011 1603   K 4.2 06/29/2013 0842   K 4.2 12/15/2011 1603   CL 104 01/05/2013 0822   CL 99 12/15/2011 1603   CO2 27 06/29/2013 0842   CO2 31 12/15/2011 1603   BUN 14.4 06/29/2013 0842   BUN 15 12/15/2011 1603   CREATININE 1.0 06/29/2013 0842   CREATININE 0.79 12/15/2011 1603      Component Value Date/Time   CALCIUM 9.7 06/29/2013 0842   CALCIUM 9.2 12/15/2011 1603   ALKPHOS 80 06/29/2013 0842   ALKPHOS 67 12/01/2011 0755   AST 18 06/29/2013 0842   AST 18 12/01/2011 0755   ALT 18 06/29/2013 0842   ALT 17 12/01/2011 0755   BILITOT 0.90 06/29/2013 0842   BILITOT 0.5 12/01/2011 0755     ADDITIONAL INFORMATION: 2. CHROMOGENIC IN-SITU HYBRIDIZATION Interpretation HER-2/NEU BY CISH - NO AMPLIFICATION OF HER-2 DETECTED. THE RATIO OF HER-2: CEP 17 SIGNALS WAS 1.45. Reference range: Ratio: HER2:CEP17 < 1.8 - gene amplification not observed Ratio: HER2:CEP 17 1.8-2.2 - equivocal result Ratio: HER2:CEP17 > 2.2 - gene amplification observed Pecola Leisure MD Pathologist, Electronic Signature ( Signed 01/05/2012) FINAL DIAGNOSIS Diagnosis 1. Lymph node, sentinel, biopsy, Right axillary #1 - ONE LYMPH NODE, NEGATIVE FOR TUMOR (0/1). 2. Breast, lumpectomy, Right - INVASIVE DUCTAL CARCINOMA, GRADE I (1.8 CM). - NO LYMPHOVASCULAR INVASION IDENTIFIED. - INVASIVE TUMOR IS 0.9 CM FROM NEAREST MARGIN (POSTERIOR). - SEE TUMOR SYNOPTIC TEMPLATE BELOW. Microscopic Comment 2. BREAST, INVASIVE TUMOR,  WITH LYMPH NODE SAMPLING Specimen, including laterality: Right breast Procedure: Lumpectomy 1 of 3 FINAL for Elizabeth Berg, Elizabeth Berg ([id number]) Microscopic Comment(continued) Grade: I of III Tubule formation: 1 Nuclear pleomorphism: 1 Mitotic: 1 Tumor size (gross measurement): 1.8 cm Margins: Invasive, distance to closest margin: 0.9 cm In-situ, distance to closest margin: N/A If margin positive, focally or broadly: N/A Lymphovascular invasion: Absent Ductal carcinoma in situ: Absent Grade: N/A Extensive intraductal component: N/A Lobular neoplasia: Absent Tumor focality: Unifocal Treatment effect: None If present, treatment effect in breast tissue, lymph nodes or both: N/A Extent of tumor: Skin: Grossly negative Nipple: N/A Skeletal muscle: N/A Lymph nodes: # examined: 1 Lymph nodes with metastasis: 0 Breast prognostic profile: Estrogen receptor: Not repeated, previous study demonstrated 100% positivity ([id number]). Progesterone receptor: Not repeated, previous study demonstrated 37% positivity Her 2 neu: Repeated, previus study demonstrated no amplification (1.23) ([id number]). Ki-67: Not repeated, previous study demonstrated 90% proliferation rate ([id number]). Non-neoplastic breast: Benign fibroadenomatoid nodules and microcalcifications in benign ducts  and lobules. TNM: pT1c, pN0, pMX Comments: None (CR:kh 12-22-11) [place] RUND DO Pathologist, Electronic Signature (Case signed 12/22/2011) Specimen Gross and Clinical Information Specimen(s) Obtained: 1. Lymph node, sentinel, biopsy, Right axillary #1 2. Breast, lumpectomy, Right Specimen Clinical Information 1. Right breast cancer (tl) Gross 1. Rapid Intraoperative Consult performed (Yes or No): No Specimen: Right axillary sentinel lymph node Number and size: 1, 2.5 cm Cut Surface(s): Tan yellow to red, focally firm, focally blue Block Summary: The node is bisected and submitted in one block labeled SLN for  routine histology 2. Specimen type: Right lumpectomy, in formalin at 14:20 hours 2 of 3 FINAL for Elizabeth Berg, Elizabeth Berg ([id number])  RADIOGRAPHIC STUDIES:  ASSESSMENT: 62 year old female with T1 C. Invasive ductal carcinoma of the right breast status post lumpectomy with sentinel node biopsy. Patient Elizabeth Berg was measuring about at 1.8 cm ER/PR positive HER-2/neu negative. I have recommended testing Oncotype DX for recurrence score. Rationale for this was discussed with the patient.  #2 patient has now completed radiation therapy her tumor was ER positive therefore she was recommended antiestrogen therapy with Aromasin 25 mg daily.   PLAN:  #1 Continue  aromasin 25 mg daily  #2 Doing well no evidence of recurrent cancer  #3 Exercise and eat healthy to maintain a good weight and mental outlook!!!  #4 I will see you back in 6 months  All questions were answered. The patient knows to call the clinic with any problems, questions or concerns. We can certainly see the patient much sooner if necessary.  I spent 25 minutes counseling the patient face to face. The total time spent in the appointment was 30 minutes.    Drue Second, MD Medical/Oncology [facility] [phone] (beeper) [phone] (Office)

## 2013-10-23 ENCOUNTER — Other Ambulatory Visit: Payer: Self-pay

## 2013-10-23 ENCOUNTER — Other Ambulatory Visit: Payer: Self-pay | Admitting: Oncology

## 2013-10-23 DIAGNOSIS — Z9889 Other specified postprocedural states: Secondary | ICD-10-CM

## 2013-10-23 DIAGNOSIS — Z853 Personal history of malignant neoplasm of breast: Secondary | ICD-10-CM

## 2013-10-26 ENCOUNTER — Encounter: Payer: Self-pay | Admitting: *Deleted

## 2013-10-26 NOTE — [facility] Oncology Navigator Note (Signed)
I called patient to check in.  Patient reports that she is doing very well.  She continues to take Aromasin and is tolerating it well without side effects.  We reviewed her upcoming appointments.  She denied any questions or concerns at this time.  I verified that she had my contact information and encouraged her to call me for any needs.

## 2013-11-13 ENCOUNTER — Ambulatory Visit
Admission: RE | Admit: 2013-11-13 | Discharge: 2013-11-13 | Disposition: A | Discharge status: Home / Self Care | Payer: BC Managed Care – PPO | Source: Ambulatory Visit | Attending: Oncology | Admitting: Oncology

## 2013-11-13 DIAGNOSIS — Z9889 Other specified postprocedural states: Secondary | ICD-10-CM

## 2013-11-13 DIAGNOSIS — Z853 Personal history of malignant neoplasm of breast: Secondary | ICD-10-CM

## 2013-12-07 ENCOUNTER — Ambulatory Visit (INDEPENDENT_AMBULATORY_CARE_PROVIDER_SITE_OTHER): Payer: BC Managed Care – PPO | Admitting: Cardiovascular Disease

## 2013-12-07 ENCOUNTER — Encounter: Payer: Self-pay | Admitting: Cardiovascular Disease

## 2013-12-07 VITALS — BP 140/88 | HR 68 | Resp 16 | Ht 64.0 in | Wt 205.4 lb

## 2013-12-07 DIAGNOSIS — R002 Palpitations: Secondary | ICD-10-CM

## 2013-12-07 DIAGNOSIS — E785 Hyperlipidemia, unspecified: Secondary | ICD-10-CM

## 2013-12-07 DIAGNOSIS — I10 Essential (primary) hypertension: Secondary | ICD-10-CM

## 2013-12-07 NOTE — Patient Instructions (Signed)
Dr. Croitoru recommends that you schedule a follow-up appointment in: One year.   

## 2013-12-10 ENCOUNTER — Encounter: Payer: Self-pay | Admitting: Cardiovascular Disease

## 2013-12-10 DIAGNOSIS — E785 Hyperlipidemia, unspecified: Secondary | ICD-10-CM | POA: Insufficient documentation

## 2013-12-10 DIAGNOSIS — I10 Essential (primary) hypertension: Secondary | ICD-10-CM | POA: Insufficient documentation

## 2013-12-10 DIAGNOSIS — R002 Palpitations: Secondary | ICD-10-CM | POA: Insufficient documentation

## 2013-12-10 NOTE — Progress Notes (Signed)
Patient ID: [name], female   DOB: [birth year], 63 y.o.   MRN: [id number]     Reason for office visit Palpitations, hypertension, hypercholesterolemia  Elizabeth Berg has not been seen in our office for over 3 years. Her husband Elizabeth Berg is also my patient. Previously seen by Dr. Elisabeth Cara for palpitations and questionable dyslipidemia. She also bears a diagnosis of systemic hypertension. She was taking verapamil both for control of her palpitations and for her blood pressure. She had a lumpectomy and radiation therapy in 2013 for cancer of the right breast and a total thyroidectomy in 2008.  She has no somatic complaints. Her cholesterol is quite low. She had a [facility] lab set of lipid values checked in 2011. Her CRP at that time was 7.5 which was felt to be elevated (but it was so high that I suspect was not in anyway related to vascular disease) she had a very much borderline elevation in LDL 3a+b at 20.2% and Decrease in HDL 2b at 19%. She was intolerant to Niaspan and subsequently started on Crestor. Her most recent lipid profile shows a total cholesterol of 114, HDL of 40, LDL of 60 and triglycerides of 71.  She has no cardiovascular complaints.  She is moderately obese with a BMI of 35. She is exercising regularly.   Allergies  Allergen Reactions  . Penicillins     Childhood allergy; reaction unknown    Current Outpatient Prescriptions  Medication Sig Dispense Refill  . exemestane (AROMASIN) 25 MG tablet Take 1 tablet (25 mg total) by mouth daily after breakfast.  90 tablet  12  . levothyroxine (SYNTHROID, LEVOTHROID) 137 MCG tablet Take 137 mcg by mouth daily.      Marland Kitchen lisinopril (PRINIVIL,ZESTRIL) 10 MG tablet Take 10 mg by mouth daily.      Marland Kitchen omeprazole (PRILOSEC) 20 MG capsule Take 20 mg by mouth daily.      . ONE TOUCH ULTRA TEST test strip       . rosuvastatin (CRESTOR) 10 MG tablet Take 10 mg by mouth daily.      . verapamil (CALAN) 120 MG tablet Take 120 mg by mouth  daily.       No current facility-administered medications for this visit.    Past Medical History  Diagnosis Date  . H/O colonoscopy 04/14/09  . H/O bone density study   . Wears glasses   . Heart burn   . Hypertension   . 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 GERD (gastroesophageal reflux disease)   . Palpitations   . Hypercholesterolemia     Past Surgical History  Procedure Laterality Date  . Cholecystectomy    . Thyroid surgery    . Breast biopsy  11/22/2011    right breast biopsy  . Colonoscopy    . Bone density study    . Total thyroidectomy  2008    Dr. Harlow Asa  . Breast lumpectomy  12/20/2011    right breast lumpectomy and right axillary lymph node biopsy    Family History  Problem Relation Age of Onset  . Esophageal cancer Mother   . Cancer Mother     esophageal  . Rectal cancer Brother   . Cancer Brother 3    rectal    History   Social History  . Marital Status: Married    Spouse Name: N/A    Number of Children: N/A  . Years of Education: N/A   Occupational  History  . Not on file.   Social History Main Topics  . Smoking status: Never Smoker   . Smokeless tobacco: Never Used  . Alcohol Use: No  . Drug Use: No  . Sexual Activity: Yes   Other Topics Concern  . Not on file   Social History Narrative  . No narrative on file    Review of systems: The patient specifically denies any chest pain at rest or with exertion, dyspnea at rest or with exertion, orthopnea, paroxysmal nocturnal dyspnea, syncope, palpitations, focal neurological deficits, intermittent claudication, lower extremity edema, unexplained weight gain, cough, hemoptysis or wheezing.  The patient also denies abdominal pain, nausea, vomiting, dysphagia, diarrhea, constipation, polyuria, polydipsia, dysuria, hematuria, frequency, urgency, abnormal bleeding or bruising, fever, chills, unexpected weight changes, mood swings, change in skin or hair texture, change  in voice quality, auditory or visual problems, allergic reactions or rashes, new musculoskeletal complaints other than usual "aches and pains".   PHYSICAL EXAM BP 140/88  Pulse 68  Resp 16  Ht 5\' 4"  (1.626 m)  Wt 205 lb 6.4 oz (93.169 kg)  BMI 35.24 kg/m2  LMP 12/20/2006  General: Alert, oriented x3, no distress Head: no evidence of trauma, PERRL, EOMI, no exophtalmos or lid lag, no myxedema, no xanthelasma; normal ears, nose and oropharynx Neck: normal jugular venous pulsations and no hepatojugular reflux; brisk carotid pulses without delay and no carotid bruits Chest: clear to auscultation, no signs of consolidation by percussion or palpation, normal fremitus, symmetrical and full respiratory excursions Cardiovascular: normal position and quality of the apical impulse, regular rhythm, normal first and second heart sounds, no murmurs, rubs or gallops Abdomen: no tenderness or distention, no masses by palpation, no abnormal pulsatility or arterial bruits, normal bowel sounds, no hepatosplenomegaly Extremities: no clubbing, cyanosis or edema; 2+ radial, ulnar and brachial pulses bilaterally; 2+ right femoral, posterior tibial and dorsalis pedis pulses; 2+ left femoral, posterior tibial and dorsalis pedis pulses; no subclavian or femoral bruits Neurological: grossly nonfocal Normal sinus rhythm  EKG: Normal sinus rhythm  Lipid Panel  total cholesterol of 114, HDL of 40, LDL of 60 and triglycerides of 71.  BMET    Component Value Date/Time   NA 141 06/29/2013 0842   NA 139 12/15/2011 1603   K 4.2 06/29/2013 0842   K 4.2 12/15/2011 1603   CL 104 01/05/2013 0822   CL 99 12/15/2011 1603   CO2 27 06/29/2013 0842   CO2 31 12/15/2011 1603   GLUCOSE 107 06/29/2013 0842   GLUCOSE 104* 01/05/2013 0822   GLUCOSE 113* 12/15/2011 1603   BUN 14.4 06/29/2013 0842   BUN 15 12/15/2011 1603   CREATININE 1.0 06/29/2013 0842   CREATININE 0.79 12/15/2011 1603   CALCIUM 9.7 06/29/2013 0842   CALCIUM 9.2  12/15/2011 1603   GFRNONAA 89* 12/15/2011 1603   GFRAA >90 12/15/2011 1603     ASSESSMENT AND PLAN  Elizabeth Berg's palpitations are well-controlled on for abdominal. Her blood pressure is usually lower at home and well within the target range. I agree that we should cut back on her cholesterol her medications. She is now taking Crestor every other day and her labs do look great. I would focus more on weight loss and improve diet rather than pharmacological therapy. She has no personal history of cardiovascular disease or family history of premature coronary problems. I think a target LDL of less than 100 is more than reasonable.  Patient Instructions  Dr. Sallyanne Kuster recommends that you schedule a follow-up  appointment in: One year.     Orders Placed This Encounter  Procedures  . EKG 12-Lead   No orders of the defined types were placed in this encounter.    Alvino Lechuga  Sanda Klein, MD, [facility] [phone] office [phone] pager

## 2013-12-12 ENCOUNTER — Encounter: Payer: Self-pay | Admitting: Internal Medicine

## 2014-01-01 ENCOUNTER — Telehealth: Payer: Self-pay | Admitting: Hematology and Oncology

## 2014-01-01 NOTE — Telephone Encounter (Signed)
, °

## 2014-01-07 ENCOUNTER — Ambulatory Visit: Payer: BC Managed Care – PPO | Admitting: Oncology

## 2014-01-07 ENCOUNTER — Other Ambulatory Visit: Payer: BC Managed Care – PPO

## 2014-02-28 ENCOUNTER — Telehealth: Payer: Self-pay | Admitting: Hematology and Oncology

## 2014-02-28 NOTE — Telephone Encounter (Signed)
, °

## 2014-03-21 ENCOUNTER — Other Ambulatory Visit: Payer: Self-pay

## 2014-03-21 DIAGNOSIS — C50319 Malignant neoplasm of lower-inner quadrant of unspecified female breast: Secondary | ICD-10-CM

## 2014-03-22 ENCOUNTER — Telehealth: Payer: Self-pay | Admitting: Hematology and Oncology

## 2014-03-22 ENCOUNTER — Ambulatory Visit (HOSPITAL_BASED_OUTPATIENT_CLINIC_OR_DEPARTMENT_OTHER): Payer: BC Managed Care – PPO | Admitting: Hematology and Oncology

## 2014-03-22 ENCOUNTER — Encounter: Payer: Self-pay | Admitting: Hematology and Oncology

## 2014-03-22 ENCOUNTER — Other Ambulatory Visit (HOSPITAL_BASED_OUTPATIENT_CLINIC_OR_DEPARTMENT_OTHER): Payer: BC Managed Care – PPO

## 2014-03-22 VITALS — BP 138/82 | HR 66 | Temp 98.6°F | Resp 18 | Ht 64.0 in | Wt 207.4 lb

## 2014-03-22 DIAGNOSIS — C50319 Malignant neoplasm of lower-inner quadrant of unspecified female breast: Secondary | ICD-10-CM

## 2014-03-22 DIAGNOSIS — Z170 Estrogen receptor positive status [ER+]: Secondary | ICD-10-CM

## 2014-03-22 LAB — COMPREHENSIVE METABOLIC PANEL (CC13)
ALBUMIN: 3.7 g/dL (ref 3.5–5.0)
ALT: 16 U/L (ref 0–55)
ANION GAP: 8 meq/L (ref 3–11)
AST: 19 U/L (ref 5–34)
Alkaline Phosphatase: 75 U/L (ref 40–150)
BUN: 18.3 mg/dL (ref 7.0–26.0)
CALCIUM: 9.1 mg/dL (ref 8.4–10.4)
CHLORIDE: 105 meq/L (ref 98–109)
CO2: 28 meq/L (ref 22–29)
Creatinine: 0.9 mg/dL (ref 0.6–1.1)
Glucose: 100 mg/dl (ref 70–140)
POTASSIUM: 4.3 meq/L (ref 3.5–5.1)
SODIUM: 141 meq/L (ref 136–145)
TOTAL PROTEIN: 7.1 g/dL (ref 6.4–8.3)
Total Bilirubin: 0.55 mg/dL (ref 0.20–1.20)

## 2014-03-22 LAB — CBC WITH DIFFERENTIAL/PLATELET
BASO%: 1 % (ref 0.0–2.0)
Basophils Absolute: 0.1 10*3/uL (ref 0.0–0.1)
EOS%: 1.5 % (ref 0.0–7.0)
Eosinophils Absolute: 0.1 10*3/uL (ref 0.0–0.5)
HEMATOCRIT: 44.2 % (ref 34.8–46.6)
HGB: 14.4 g/dL (ref 11.6–15.9)
LYMPH#: 1.5 10*3/uL (ref 0.9–3.3)
LYMPH%: 24.2 % (ref 14.0–49.7)
MCH: 28.2 pg (ref 25.1–34.0)
MCHC: 32.5 g/dL (ref 31.5–36.0)
MCV: 86.7 fL (ref 79.5–101.0)
MONO#: 0.4 10*3/uL (ref 0.1–0.9)
MONO%: 7.1 % (ref 0.0–14.0)
NEUT#: 4 10*3/uL (ref 1.5–6.5)
NEUT%: 66.2 % (ref 38.4–76.8)
Platelets: 230 10*3/uL (ref 145–400)
RBC: 5.1 10*6/uL (ref 3.70–5.45)
RDW: 14.9 % — AB (ref 11.2–14.5)
WBC: 6.1 10*3/uL (ref 3.9–10.3)

## 2014-03-22 NOTE — Assessment & Plan Note (Signed)
Right breast cancer T1 C. N0 M0 stage IA ER/PR positive HER-2 negative status post lumpectomy and radiation currently on antiestrogen therapy with Aromasin. Patient has had very minimal side effects for Aromasin. Occasional mood swings and only concern. Her mammogram was reviewed from April 2015. Her breast exam today was also normal. We will see her back in April 2016 with another mammogram and followup.  Because she is on antiestrogen therapy and she is postmenopausal, I would like to obtain a DEXA scan to evaluate her bones.  Discussed the importance of physical exercise in decreasing the likelihood of breast cancer recurrence. Recommended 30 mins daily 6 days a week of either brisk walking or cycling or swimming. Encouraged patient to eat more fruits and vegetables and decrease red meat.

## 2014-03-22 NOTE — Telephone Encounter (Signed)
per pof to sch pt appt-scg pt mamma & DEXA-gave pt copy of sch

## 2014-03-22 NOTE — Progress Notes (Signed)
Patient Care Team: Thressa Sheller, MD as PCP - General (Internal Medicine) Thressa Sheller, MD as Consulting Physician (Internal Medicine) Sanda Klein, MD as Consulting Physician (Cardiology) Olga Millers, MD as Consulting Physician (Obstetrics and Gynecology) Juanita Craver, MD as Consulting Physician (Gastroenterology) Jacelyn Pi, MD as Consulting Physician (Endocrinology) Deatra Robinson, MD as Consulting Physician (Hematology and Oncology) Thea Silversmith, MD as Consulting Physician (Radiation Oncology) Jesusita Oka, RN as Registered Nurse  DIAGNOSIS: Cancer of lower-inner quadrant of female breast   Primary site: Breast (Right)   Staging method: AJCC 7th Edition   Clinical: Stage IA (T1a, N0, cM0)   Summary: Stage IA (T1a, N0, cM0)   Clinical comments: Staged In Breast Conference 5.15.13   SUMMARY OF ONCOLOGIC HISTORY:   Cancer of lower-inner quadrant of female breast   11/25/2011 Initial Diagnosis Cancer of lower-inner quadrant of female breast   12/20/2011 Surgery Right lumpectomy with SLN biopsy 1.8 cm ER 100%, EF 37%, HER-2 negative, Ki-67 90%, grade 1, one sentinel lymph node negative stage I A. IDC: Not enough tissue for Oncotype DX   02/20/2012 - 03/22/2012 Radiation Therapy Radiation therapy to lumpectomy site   04/05/2012 -  Anti-estrogen oral therapy Aromasin 25 mg once daily 5 years of therapy is the plan    CHIEF COMPLIANT: Occasional mood swings  INTERVAL HISTORY: Ms.Elizabeth Berg is a 63 year old Caucasian with above-mentioned history of stage I breast cancer who is currently on antiestrogen therapy with Aromasin. She is tolerating it very well without any major problems other than mood swings. She reports of hot flashes muscle aches and pains are very minimal. She has had no major problems tolerating it. No lumps or nodules are felt by her in the breast.   REVIEW OF SYSTEMS:   Constitutional: Denies fevers, chills or abnormal weight loss Eyes: Denies blurriness of  vision Ears, nose, mouth, throat, and face: Denies mucositis or sore throat Respiratory: Denies cough, dyspnea or wheezes Cardiovascular: Denies palpitation, chest discomfort or lower extremity swelling Gastrointestinal:  Denies nausea, heartburn or change in bowel habits Skin: Denies abnormal skin rashes Lymphatics: Denies new lymphadenopathy or easy bruising Neurological:Denies numbness, tingling or new weaknesses Behavioral/Psych: Mood is stable, no new changes  Breast:  denies any pain or lumps or nodules in either breasts All other systems were reviewed with the patient and are negative.  I have reviewed the past medical history, past surgical history, social history and family history with the patient and they are unchanged from previous note.  ALLERGIES:  is allergic to penicillins.  MEDICATIONS:  Current Outpatient Prescriptions  Medication Sig Dispense Refill  . exemestane (AROMASIN) 25 MG tablet Take 1 tablet (25 mg total) by mouth daily after breakfast.  90 tablet  12  . levothyroxine (SYNTHROID, LEVOTHROID) 137 MCG tablet Take 137 mcg by mouth daily.      Marland Kitchen lisinopril (PRINIVIL,ZESTRIL) 10 MG tablet Take 10 mg by mouth daily.      Marland Kitchen omeprazole (PRILOSEC) 20 MG capsule Take 20 mg by mouth daily.      . rosuvastatin (CRESTOR) 10 MG tablet Take 10 mg by mouth daily.      . verapamil (CALAN) 120 MG tablet Take 120 mg by mouth daily.       No current facility-administered medications for this visit.    PHYSICAL EXAMINATION: ECOG PERFORMANCE STATUS: 0 - Asymptomatic  Filed Vitals:   03/22/14 0833  BP: 138/82  Pulse: 66  Temp: 98.6 F (37 C)  Resp: 18   Filed Weights  03/22/14 0833  Weight: 207 lb 6 oz (94.065 kg)    GENERAL:alert, no distress and comfortable SKIN: skin color, texture, turgor are normal, no rashes or significant lesions EYES: normal, Conjunctiva are pink and non-injected, sclera clear OROPHARYNX:no exudate, no erythema and lips, buccal mucosa,  and tongue normal  NECK: supple, thyroid normal size, non-tender, without nodularity LYMPH:  no palpable lymphadenopathy in the cervical, axillary or inguinal LUNGS: clear to auscultation and percussion with normal breathing effort HEART: regular rate & rhythm and no murmurs and no lower extremity edema ABDOMEN:abdomen soft, non-tender and normal bowel sounds Musculoskeletal:no cyanosis of digits and no clubbing  NEURO: alert & oriented x 3 with fluent speech, no focal motor/sensory deficits BREAST: No palpable masses lungs or nodules in either right or left breasts. No palpable axillary supraclavicular or infraclavicular adenopathy no breast tenderness or nipple discharge.   LABORATORY DATA:  I have reviewed the data as listed   Chemistry      Component Value Date/Time   NA 141 06/29/2013 0842   NA 139 12/15/2011 1603   K 4.2 06/29/2013 0842   K 4.2 12/15/2011 1603   CL 104 01/05/2013 0822   CL 99 12/15/2011 1603   CO2 27 06/29/2013 0842   CO2 31 12/15/2011 1603   BUN 14.4 06/29/2013 0842   BUN 15 12/15/2011 1603   CREATININE 1.0 06/29/2013 0842   CREATININE 0.79 12/15/2011 1603      Component Value Date/Time   CALCIUM 9.7 06/29/2013 0842   CALCIUM 9.2 12/15/2011 1603   ALKPHOS 80 06/29/2013 0842   ALKPHOS 67 12/01/2011 0755   AST 18 06/29/2013 0842   AST 18 12/01/2011 0755   ALT 18 06/29/2013 0842   ALT 17 12/01/2011 0755   BILITOT 0.90 06/29/2013 0842   BILITOT 0.5 12/01/2011 0755       Lab Results  Component Value Date   WBC 6.1 03/22/2014   HGB 14.4 03/22/2014   HCT 44.2 03/22/2014   MCV 86.7 03/22/2014   PLT 230 03/22/2014   NEUTROABS 4.0 03/22/2014     RADIOGRAPHIC STUDIES: I have personally reviewed the radiology reports and agreed with their findings. No results found.   ASSESSMENT & PLAN:  Cancer of lower-inner quadrant of female breast Right breast cancer T1 C. N0 M0 stage IA ER/PR positive HER-2 negative status post lumpectomy and radiation currently on antiestrogen  therapy with Aromasin. Patient has had very minimal side effects for Aromasin. Occasional mood swings and only concern. Her mammogram was reviewed from April 2015. Her breast exam today was also normal. We will see her back in April 2016 with another mammogram and followup.  Because she is on antiestrogen therapy and she is postmenopausal, I would like to obtain a DEXA scan to evaluate her bones.  Discussed the importance of physical exercise in decreasing the likelihood of breast cancer recurrence. Recommended 30 mins daily 6 days a week of either brisk walking or cycling or swimming. Encouraged patient to eat more fruits and vegetables and decrease red meat.      Orders Placed This Encounter  Procedures  . MM Digital Diagnostic Bilat    Standing Status: Future     Number of Occurrences:      Standing Expiration Date: 03/22/2015    Order Specific Question:  Reason for Exam (SYMPTOM  OR DIAGNOSIS REQUIRED)    Answer:  Breast cancer treated with lumpectomy annual surveillance exam    Order Specific Question:  Preferred imaging location?  Answer:  [facility]  . DG Bone Density    Standing Status: Future     Number of Occurrences:      Standing Expiration Date: 03/22/2015    Order Specific Question:  Reason for Exam (SYMPTOM  OR DIAGNOSIS REQUIRED)    Answer:  On antiestrogen therapy postmenopausal    Order Specific Question:  Preferred imaging location?    Answer:  [facility]  . CBC with Differential    Standing Status: Future     Number of Occurrences:      Standing Expiration Date: 03/22/2015  . Comprehensive metabolic panel (Cmet) - [facility]    Standing Status: Future     Number of Occurrences:      Standing Expiration Date: 03/22/2015   The patient has a good understanding of the overall plan. she agrees with it. She will call with any problems that may develop before her next visit here.  I spent 25 minutes counseling the patient face to face. The total time spent in the  appointment was 30 minutes and more than 50% was on counseling and review of test results    Rulon Eisenmenger, MD 03/22/2014 8:58 AM

## 2014-03-29 ENCOUNTER — Other Ambulatory Visit: Payer: BC Managed Care – PPO

## 2014-03-29 ENCOUNTER — Ambulatory Visit: Payer: Self-pay | Admitting: Hematology and Oncology

## 2014-04-18 ENCOUNTER — Encounter (INDEPENDENT_AMBULATORY_CARE_PROVIDER_SITE_OTHER): Payer: BC Managed Care – PPO | Admitting: Surgery

## 2014-04-22 ENCOUNTER — Telehealth: Payer: Self-pay

## 2014-04-22 NOTE — Telephone Encounter (Signed)
Office notes rcvd from [place] 04/18/14.  Copy to Dr Lindi Adie.  Original to scan.

## 2014-04-27 ENCOUNTER — Other Ambulatory Visit: Payer: Self-pay | Admitting: Oncology

## 2014-04-27 DIAGNOSIS — C50319 Malignant neoplasm of lower-inner quadrant of unspecified female breast: Secondary | ICD-10-CM

## 2014-05-09 ENCOUNTER — Other Ambulatory Visit: Payer: Self-pay | Admitting: Oncology

## 2014-05-09 ENCOUNTER — Other Ambulatory Visit: Payer: Self-pay

## 2014-05-09 DIAGNOSIS — C50311 Malignant neoplasm of lower-inner quadrant of right female breast: Secondary | ICD-10-CM

## 2014-05-09 MED ORDER — EXEMESTANE 25 MG PO TABS: ORAL_TABLET | ORAL | Status: DC

## 2014-05-09 NOTE — Telephone Encounter (Signed)
Let pt know 30 day prescription for aromasin at [facility].  Pt voiced understanding.

## 2014-05-27 ENCOUNTER — Other Ambulatory Visit: Payer: Self-pay | Admitting: Obstetrics and Gynecology

## 2014-05-27 DIAGNOSIS — N63 Unspecified lump in unspecified breast: Secondary | ICD-10-CM

## 2014-05-27 DIAGNOSIS — N644 Mastodynia: Secondary | ICD-10-CM

## 2014-05-28 LAB — CYTOLOGY - PAP

## 2014-06-03 ENCOUNTER — Ambulatory Visit
Admission: RE | Admit: 2014-06-03 | Discharge: 2014-06-03 | Disposition: A | Discharge status: Home / Self Care | Payer: BC Managed Care – PPO | Source: Ambulatory Visit | Attending: Obstetrics and Gynecology | Admitting: Obstetrics and Gynecology

## 2014-06-03 DIAGNOSIS — N63 Unspecified lump in unspecified breast: Secondary | ICD-10-CM

## 2014-06-03 DIAGNOSIS — N644 Mastodynia: Secondary | ICD-10-CM

## 2014-06-05 ENCOUNTER — Other Ambulatory Visit: Payer: BC Managed Care – PPO

## 2014-06-11 ENCOUNTER — Encounter: Payer: Self-pay | Admitting: *Deleted

## 2014-06-11 NOTE — [facility] Oncology Navigator Note (Signed)
I called patient to check in.  Patient reports that she is doing very well.  We discussed that her recent mammogram was normal.  Patient denied any questions or concerns at this time.  I encouraged her to call me for any need she may have.

## 2014-10-18 ENCOUNTER — Ambulatory Visit
Admission: RE | Admit: 2014-10-18 | Discharge: 2014-10-18 | Disposition: A | Discharge status: Home / Self Care | Payer: BLUE CROSS/BLUE SHIELD | Source: Ambulatory Visit | Attending: Hematology and Oncology | Admitting: Hematology and Oncology

## 2014-10-18 DIAGNOSIS — C50319 Malignant neoplasm of lower-inner quadrant of unspecified female breast: Secondary | ICD-10-CM

## 2014-10-28 ENCOUNTER — Ambulatory Visit (HOSPITAL_BASED_OUTPATIENT_CLINIC_OR_DEPARTMENT_OTHER): Payer: BLUE CROSS/BLUE SHIELD | Admitting: Hematology and Oncology

## 2014-10-28 ENCOUNTER — Other Ambulatory Visit (HOSPITAL_BASED_OUTPATIENT_CLINIC_OR_DEPARTMENT_OTHER): Payer: BLUE CROSS/BLUE SHIELD

## 2014-10-28 ENCOUNTER — Telehealth: Payer: Self-pay | Admitting: Hematology and Oncology

## 2014-10-28 VITALS — BP 128/75 | HR 62 | Temp 97.9°F | Resp 18 | Ht 64.0 in | Wt 201.8 lb

## 2014-10-28 DIAGNOSIS — C50211 Malignant neoplasm of upper-inner quadrant of right female breast: Secondary | ICD-10-CM

## 2014-10-28 DIAGNOSIS — C50311 Malignant neoplasm of lower-inner quadrant of right female breast: Secondary | ICD-10-CM

## 2014-10-28 DIAGNOSIS — C50319 Malignant neoplasm of lower-inner quadrant of unspecified female breast: Secondary | ICD-10-CM

## 2014-10-28 DIAGNOSIS — Z170 Estrogen receptor positive status [ER+]: Secondary | ICD-10-CM | POA: Diagnosis not present

## 2014-10-28 LAB — CBC WITH DIFFERENTIAL/PLATELET
BASO%: 0.9 % (ref 0.0–2.0)
Basophils Absolute: 0 10*3/uL (ref 0.0–0.1)
EOS ABS: 0 10*3/uL (ref 0.0–0.5)
EOS%: 1 % (ref 0.0–7.0)
HEMATOCRIT: 44.2 % (ref 34.8–46.6)
HGB: 14.7 g/dL (ref 11.6–15.9)
LYMPH#: 1.2 10*3/uL (ref 0.9–3.3)
LYMPH%: 24.5 % (ref 14.0–49.7)
MCH: 28.7 pg (ref 25.1–34.0)
MCHC: 33.4 g/dL (ref 31.5–36.0)
MCV: 86.1 fL (ref 79.5–101.0)
MONO#: 0.5 10*3/uL (ref 0.1–0.9)
MONO%: 9.9 % (ref 0.0–14.0)
NEUT%: 63.7 % (ref 38.4–76.8)
NEUTROS ABS: 3.2 10*3/uL (ref 1.5–6.5)
PLATELETS: 223 10*3/uL (ref 145–400)
RBC: 5.13 10*6/uL (ref 3.70–5.45)
RDW: 14.7 % — ABNORMAL HIGH (ref 11.2–14.5)
WBC: 5 10*3/uL (ref 3.9–10.3)

## 2014-10-28 LAB — COMPREHENSIVE METABOLIC PANEL (CC13)
ALK PHOS: 83 U/L (ref 40–150)
ALT: 22 U/L (ref 0–55)
AST: 22 U/L (ref 5–34)
Albumin: 3.9 g/dL (ref 3.5–5.0)
Anion Gap: 10 mEq/L (ref 3–11)
BUN: 22.1 mg/dL (ref 7.0–26.0)
CALCIUM: 9.1 mg/dL (ref 8.4–10.4)
CO2: 30 meq/L — AB (ref 22–29)
CREATININE: 1 mg/dL (ref 0.6–1.1)
Chloride: 103 mEq/L (ref 98–109)
EGFR: 64 mL/min/{1.73_m2} — AB (ref >90)
Glucose: 109 mg/dl (ref 70–140)
Potassium: 4.7 mEq/L (ref 3.5–5.1)
SODIUM: 142 meq/L (ref 136–145)
TOTAL PROTEIN: 7.3 g/dL (ref 6.4–8.3)
Total Bilirubin: 0.57 mg/dL (ref 0.20–1.20)

## 2014-10-28 NOTE — Telephone Encounter (Signed)
appointments made and avs printed for patient °

## 2014-10-28 NOTE — Assessment & Plan Note (Signed)
Right breast cancer T1 C. N0 M0 stage IA ER/PR positive HER-2 negative status post lumpectomy 12/20/2011 and radiation currently on antiestrogen therapy with Aromasin 04/05/2012  Aromasin toxicities: 1. Occasional hot flashes 2. Mood swings 3. Mild myalgias  Breast cancer surveillance: 1. Breast exam 10/28/2014 is normal 2. Mammogram 10/18/2014 is normal category B density  Survivorship:Discussed the importance of physical exercise in decreasing the likelihood of breast cancer recurrence. Recommended 30 mins daily 6 days a week of either brisk walking or cycling or swimming. Encouraged patient to eat more fruits and vegetables and decrease red meat.

## 2014-10-28 NOTE — Progress Notes (Signed)
Patient Care Team: Thressa Sheller, MD as PCP - General (Internal Medicine) Thressa Sheller, MD as Consulting Physician (Internal Medicine) Sanda Klein, MD as Consulting Physician (Cardiology) Olga Millers, MD as Consulting Physician (Obstetrics and Gynecology) Juanita Craver, MD as Consulting Physician (Gastroenterology) Jacelyn Pi, MD as Consulting Physician (Endocrinology) Consuela Mimes, MD as Consulting Physician (Hematology and Oncology) Thea Silversmith, MD as Consulting Physician (Radiation Oncology) Jesusita Oka, RN as Registered Nurse  DIAGNOSIS: Primary cancer of lower-inner quadrant of right female breast   Staging form: Breast, AJCC 7th Edition     Clinical: Stage IA (T1a, N0, cM0) - Unsigned       Staging comments: Staged In Breast Conference 5.15.13      Pathologic: No stage assigned - Unsigned   SUMMARY OF ONCOLOGIC HISTORY:   Primary cancer of lower-inner quadrant of right female breast   11/25/2011 Initial Diagnosis Cancer of lower-inner quadrant of female breast   12/20/2011 Surgery Right lumpectomy with SLN biopsy 1.8 cm ER 100%, EF 37%, HER-2 negative, Ki-67 90%, grade 1, one sentinel lymph node negative stage I A. IDC: Not enough tissue for Oncotype DX   02/20/2012 - 03/22/2012 Radiation Therapy Radiation therapy to lumpectomy site   04/05/2012 -  Anti-estrogen oral therapy Aromasin 25 mg once daily 5 years of therapy is the plan    CHIEF COMPLIANT: Follow-up of breast cancer  INTERVAL HISTORY: Elizabeth Berg is a 64 year old with above-mentioned history of right-sided breast cancer treated with lumpectomy and radiation was currently on Aromasin and tolerating it very well. She does have occasional hot flashes occasional mood swings and occasional muscle aches and pains. But she appears to be tolerating treatment fairly well. Denies any new lumps or nodules in the breast.  REVIEW OF SYSTEMS:   Constitutional: Denies fevers, chills or abnormal weight loss Eyes:  Denies blurriness of vision Ears, nose, mouth, throat, and face: Denies mucositis or sore throat Respiratory: Denies cough, dyspnea or wheezes Cardiovascular: Denies palpitation, chest discomfort or lower extremity swelling Gastrointestinal:  Denies nausea, heartburn or change in bowel habits Skin: Denies abnormal skin rashes Lymphatics: Denies new lymphadenopathy or easy bruising Neurological:Denies numbness, tingling or new weaknesses Behavioral/Psych: Mood is stable, no new changes  Breast:  denies any pain or lumps or nodules in either breasts All other systems were reviewed with the patient and are negative.  I have reviewed the past medical history, past surgical history, social history and family history with the patient and they are unchanged from previous note.  ALLERGIES:  is allergic to penicillins.  MEDICATIONS:  Current Outpatient Prescriptions  Medication Sig Dispense Refill  . exemestane (AROMASIN) 25 MG tablet TAKE 1 TABLET (25 MG TOTAL) BY MOUTH DAILY AFTER BREAKFAST. 30 tablet 0  . levothyroxine (SYNTHROID, LEVOTHROID) 137 MCG tablet Take 137 mcg by mouth daily.    Marland Kitchen lisinopril (PRINIVIL,ZESTRIL) 10 MG tablet Take 10 mg by mouth daily.    Marland Kitchen omeprazole (PRILOSEC) 20 MG capsule Take 20 mg by mouth daily.    . rosuvastatin (CRESTOR) 10 MG tablet Take 10 mg by mouth daily.    . verapamil (CALAN) 120 MG tablet Take 120 mg by mouth daily.     No current facility-administered medications for this visit.    PHYSICAL EXAMINATION: ECOG PERFORMANCE STATUS: 1 - Symptomatic but completely ambulatory  Filed Vitals:   10/28/14 0825  BP: 128/75  Pulse: 62  Temp: 97.9 F (36.6 C)  Resp: 18   Filed Weights   10/28/14 0825  Weight:  201 lb 12.8 oz (91.536 kg)    GENERAL:alert, no distress and comfortable SKIN: skin color, texture, turgor are normal, no rashes or significant lesions EYES: normal, Conjunctiva are pink and non-injected, sclera clear OROPHARYNX:no exudate,  no erythema and lips, buccal mucosa, and tongue normal  NECK: supple, thyroid normal size, non-tender, without nodularity LYMPH:  no palpable lymphadenopathy in the cervical, axillary or inguinal LUNGS: clear to auscultation and percussion with normal breathing effort HEART: regular rate & rhythm and no murmurs and no lower extremity edema ABDOMEN:abdomen soft, non-tender and normal bowel sounds Musculoskeletal:no cyanosis of digits and no clubbing  NEURO: alert & oriented x 3 with fluent speech, no focal motor/sensory deficits BREAST: No palpable masses or nodules in either right or left breasts. No palpable axillary supraclavicular or infraclavicular adenopathy no breast tenderness or nipple discharge. (exam performed in the presence of a chaperone)  LABORATORY DATA:  I have reviewed the data as listed   Chemistry      Component Value Date/Time   NA 141 03/22/2014 0820   NA 139 12/15/2011 1603   K 4.3 03/22/2014 0820   K 4.2 12/15/2011 1603   CL 104 01/05/2013 0822   CL 99 12/15/2011 1603   CO2 28 03/22/2014 0820   CO2 31 12/15/2011 1603   BUN 18.3 03/22/2014 0820   BUN 15 12/15/2011 1603   CREATININE 0.9 03/22/2014 0820   CREATININE 0.79 12/15/2011 1603      Component Value Date/Time   CALCIUM 9.1 03/22/2014 0820   CALCIUM 9.2 12/15/2011 1603   ALKPHOS 75 03/22/2014 0820   ALKPHOS 67 12/01/2011 0755   AST 19 03/22/2014 0820   AST 18 12/01/2011 0755   ALT 16 03/22/2014 0820   ALT 17 12/01/2011 0755   BILITOT 0.55 03/22/2014 0820   BILITOT 0.5 12/01/2011 0755       Lab Results  Component Value Date   WBC 5.0 10/28/2014   HGB 14.7 10/28/2014   HCT 44.2 10/28/2014   MCV 86.1 10/28/2014   PLT 223 10/28/2014   NEUTROABS 3.2 10/28/2014     RADIOGRAPHIC STUDIES: I have personally reviewed the radiology reports and agreed with their findings. Mammogram 10/28/2014 is normal  ASSESSMENT & PLAN:  Primary cancer of lower-inner quadrant of right female breast Right  breast cancer T1 C. N0 M0 stage IA ER/PR positive HER-2 negative status post lumpectomy 12/20/2011 and radiation currently on antiestrogen therapy with Aromasin 04/05/2012  Aromasin toxicities: 1. Occasional hot flashes 2. Mood swings 3. Mild myalgias  Breast cancer surveillance: 1. Breast exam 10/28/2014 is normal 2. Mammogram 10/18/2014 is normal category B density  Survivorship:Discussed the importance of physical exercise in decreasing the likelihood of breast cancer recurrence. Recommended 30 mins daily 6 days a week of either brisk walking or cycling or swimming. Encouraged patient to eat more fruits and vegetables and decrease red meat.    Patient has joined the weight loss program and is attempting to lose some weight. Return to clinic in 1 year  No orders of the defined types were placed in this encounter.   The patient has a good understanding of the overall plan. she agrees with it. She will call with any problems that may develop before her next visit here.   Rulon Eisenmenger, MD

## 2014-10-29 ENCOUNTER — Encounter: Payer: Self-pay | Admitting: *Deleted

## 2014-10-29 NOTE — Progress Notes (Signed)
Received bone density report from [facility], sent to scan.

## 2014-10-31 ENCOUNTER — Encounter: Payer: Self-pay | Admitting: *Deleted

## 2014-10-31 NOTE — Progress Notes (Signed)
Received bone density report from [place], sent to scan.

## 2014-11-12 ENCOUNTER — Other Ambulatory Visit: Payer: Self-pay | Admitting: Dermatology

## 2014-12-05 ENCOUNTER — Encounter: Payer: Self-pay | Admitting: Hematology and Oncology

## 2015-02-14 ENCOUNTER — Telehealth: Payer: Self-pay

## 2015-02-14 DIAGNOSIS — C50311 Malignant neoplasm of lower-inner quadrant of right female breast: Secondary | ICD-10-CM

## 2015-02-14 MED ORDER — EXEMESTANE 25 MG PO TABS: ORAL_TABLET | ORAL | Status: DC

## 2015-02-14 NOTE — Telephone Encounter (Signed)
Called pt and she needs her aromasin refilled. Will send a one month request to [facility] to hold her over, and a [facility]

## 2015-02-14 NOTE — Telephone Encounter (Signed)
Pt called needing refill on a medication. Only has 1 pill left. [facility].

## 2015-03-10 ENCOUNTER — Other Ambulatory Visit: Payer: Self-pay | Admitting: Hematology and Oncology

## 2015-03-10 DIAGNOSIS — C50311 Malignant neoplasm of lower-inner quadrant of right female breast: Secondary | ICD-10-CM

## 2015-03-21 ENCOUNTER — Other Ambulatory Visit: Payer: Self-pay | Admitting: Chiropractic Medicine

## 2015-03-21 DIAGNOSIS — R52 Pain, unspecified: Secondary | ICD-10-CM

## 2015-03-30 ENCOUNTER — Ambulatory Visit
Admission: RE | Admit: 2015-03-30 | Discharge: 2015-03-30 | Disposition: A | Discharge status: Home / Self Care | Payer: BLUE CROSS/BLUE SHIELD | Source: Ambulatory Visit | Attending: Chiropractic Medicine | Admitting: Chiropractic Medicine

## 2015-03-30 DIAGNOSIS — R52 Pain, unspecified: Secondary | ICD-10-CM

## 2015-05-09 ENCOUNTER — Telehealth: Payer: Self-pay | Admitting: Hematology and Oncology

## 2015-05-09 NOTE — Telephone Encounter (Signed)
s.w. pt and adivsedon 4.13 appt cx and moved to 4.19 due to me on pal....pt ok and aware

## 2015-05-12 ENCOUNTER — Ambulatory Visit (INDEPENDENT_AMBULATORY_CARE_PROVIDER_SITE_OTHER): Payer: BLUE CROSS/BLUE SHIELD | Admitting: Cardiovascular Disease

## 2015-05-12 ENCOUNTER — Encounter: Payer: Self-pay | Admitting: Cardiovascular Disease

## 2015-05-12 VITALS — BP 140/90 | HR 67 | Resp 16 | Ht 64.0 in | Wt 189.0 lb

## 2015-05-12 DIAGNOSIS — R002 Palpitations: Secondary | ICD-10-CM

## 2015-05-12 DIAGNOSIS — E785 Hyperlipidemia, unspecified: Secondary | ICD-10-CM

## 2015-05-12 DIAGNOSIS — I10 Essential (primary) hypertension: Secondary | ICD-10-CM

## 2015-05-12 NOTE — Patient Instructions (Signed)
Dr. Croitoru recommends that you schedule a follow-up appointment in: ONE YEAR   

## 2015-05-12 NOTE — Progress Notes (Signed)
Patient ID: CONI HOMESLEY, female   DOB: June 28, 1951, 64 y.o.   MRN: [id number]     Cardiology Office Note   Date:  05/12/2015   ID:  MAELEE HOOT, DOB 12-13-1950, MRN [id number]  PCP:  Thressa Sheller, MD  Cardiologist:   Sanda Klein, MD   Chief Complaint  Patient presents with  . OVERDUE YEARLY  . REDNESS IN LEGS      History of Present Illness: JESSICAMARIE AMIRI is a 64 y.o. female who presents for  Concerned regarding the circulation in her legs. She has noticed redness in both calves as well as superficial "spider veins". She has previously undergone some type of ablation procedure on the left leg (probably laser ablation or radiofrequency), but the skin changes are present in both legs. She does not have significant edema and does not have any skin ulcers. She reports having a venous Doppler performed at another facility in June and was told that there is "a little problem with the deep veins". I suspect that she had some deep vein reflux. She was advised that the treatment involves compression stockings and that surgical procedures are not recommended.   She does not have any symptoms of cardiac problems. She had labs performed about a month ago with Dr. Noah Delaine.  These are generally favorable other than a very slight elevation in glucose 103. Her lipid profile was fair even though she takes her statin only twice weekly to avoid myalgia. Hemoglobin A1c was normal at 5.5% and her thyroid function tests were also normal.    Past Medical History  Diagnosis Date  . H/O colonoscopy 04/14/09  . H/O bone density study   . Wears glasses   . Heart burn   . Hypertension   . 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 GERD (gastroesophageal reflux disease)   . Palpitations   . Hypercholesterolemia     Past Surgical History  Procedure Laterality Date  . Cholecystectomy    . Thyroid surgery    . Breast biopsy  11/22/2011    right breast  biopsy  . Colonoscopy    . Bone density study    . Total thyroidectomy  2008    Dr. Harlow Asa  . Breast lumpectomy  12/20/2011    right breast lumpectomy and right axillary lymph node biopsy     Current Outpatient Prescriptions  Medication Sig Dispense Refill  . exemestane (AROMASIN) 25 MG tablet TAKE 1 TABLET (25 MG TOTAL) BY MOUTH DAILY AFTER BREAKFAST. 90 tablet 3  . exemestane (AROMASIN) 25 MG tablet TAKE 1 TABLET (25 MG TOTAL) BY MOUTH DAILY AFTER BREAKFAST. 90 tablet 3  . levothyroxine (SYNTHROID, LEVOTHROID) 137 MCG tablet Take 137 mcg by mouth daily.    Marland Kitchen lisinopril (PRINIVIL,ZESTRIL) 10 MG tablet Take 10 mg by mouth daily.    Marland Kitchen omeprazole (PRILOSEC) 20 MG capsule Take 20 mg by mouth daily.    . rosuvastatin (CRESTOR) 10 MG tablet Take 10 mg by mouth daily.    . verapamil (CALAN) 120 MG tablet Take 120 mg by mouth daily.     No current facility-administered medications for this visit.    Allergies:   Penicillins    Social History:  The patient  reports that she has never smoked. She has never used smokeless tobacco. She reports that she does not drink alcohol or use illicit drugs.   Family History:  The patient's family history includes Cancer in her mother;  Cancer (age of onset: 52) in her brother; Esophageal cancer in her mother; Rectal cancer in her brother.    ROS:  Please see the history of present illness.    Otherwise, review of systems positive for none.   All other systems are reviewed and negative.    PHYSICAL EXAM: VS:  BP 140/90 mmHg  Pulse 67  Resp 16  Ht 5\' 4"  (1.626 m)  Wt 189 lb (85.73 kg)  BMI 32.43 kg/m2  LMP 12/20/2006 , BMI Body mass index is 32.43 kg/(m^2).  General: Alert, oriented x3, no distress Head: no evidence of trauma, PERRL, EOMI, no exophtalmos or lid lag, no myxedema, no xanthelasma; normal ears, nose and oropharynx Neck: normal jugular venous pulsations and no hepatojugular reflux; brisk carotid pulses without delay and no carotid  bruits Chest: clear to auscultation, no signs of consolidation by percussion or palpation, normal fremitus, symmetrical and full respiratory excursions Cardiovascular: normal position and quality of the apical impulse, regular rhythm, normal first and second heart sounds, no murmurs, rubs or gallops Abdomen: no tenderness or distention, no masses by palpation, no abnormal pulsatility or arterial bruits, normal bowel sounds, no hepatosplenomegaly Extremities: no clubbing, cyanosis or edema; 2+ radial, ulnar and brachial pulses bilaterally; 2+ right femoral, posterior tibial and dorsalis pedis pulses; 2+ left femoral, posterior tibial and dorsalis pedis pulses; no subclavian or femoral bruits  there is some splotchy redness overlying both pretibial areas in bilateral spider veins Neurological: grossly nonfocal Psych: euthymic mood, full affect   EKG:  EKG is ordered today. The ekg ordered today demonstrates  Normal sinus rhythm, QTC 418 ms   Recent Labs: 10/28/2014: ALT 22; BUN 22.1; Creatinine 1.0; HGB 14.7; Platelets 223; Potassium 4.7; Sodium 142    Lipid Panel No results found for: CHOL, TRIG, HDL, CHOLHDL, VLDL, LDLCALC, LDLDIRECT    Wt Readings from Last 3 Encounters:  05/12/15 189 lb (85.73 kg)  10/28/14 201 lb 12.8 oz (91.536 kg)  03/22/14 207 lb 6 oz (94.065 kg)   .   ASSESSMENT AND PLAN:  1.  Mild peripheral venous insufficiency. She really does not have any serious signs of complications from venous insufficiency. I recommended also that she wear compression stockings and that as much is possible she should keep her legs elevated. We'll try to retrieve the actual results of the venous duplex study.  2.  Dyslipidemia with excellent lipid profile despite the fact that she takes the statin only twice weekly secondary to myalgia    Current medicines are reviewed at length with the patient today.  The patient does not have concerns regarding medicines.  The following changes  have been made:  no change  Labs/ tests ordered today include:  Orders Placed This Encounter  Procedures  . EKG 12-Lead     Patient Instructions  Dr. Sallyanne Kuster recommends that you schedule a follow-up appointment in: ONE YEAR       SignedSanda Klein, MD  05/12/2015 11:18 PM    Sanda Klein, MD, [facility] [phone] office [phone] pager

## 2015-05-22 ENCOUNTER — Encounter: Payer: Self-pay | Admitting: Cardiovascular Disease

## 2015-06-09 ENCOUNTER — Other Ambulatory Visit: Payer: Self-pay | Admitting: Obstetrics and Gynecology

## 2015-06-10 LAB — CYTOLOGY - PAP

## 2015-09-05 ENCOUNTER — Other Ambulatory Visit: Payer: Self-pay | Admitting: Hematology and Oncology

## 2015-09-05 DIAGNOSIS — Z853 Personal history of malignant neoplasm of breast: Secondary | ICD-10-CM

## 2015-09-26 ENCOUNTER — Telehealth: Payer: Self-pay | Admitting: Hematology and Oncology

## 2015-09-26 NOTE — Telephone Encounter (Signed)
S/w pt, advised appt time chg on 4/19 from 3.15 to 11.45am due to [facility]. Pt verbalized understanding.

## 2015-10-30 ENCOUNTER — Ambulatory Visit: Payer: BLUE CROSS/BLUE SHIELD | Admitting: Hematology and Oncology

## 2015-11-05 ENCOUNTER — Telehealth: Payer: Self-pay | Admitting: Hematology and Oncology

## 2015-11-05 ENCOUNTER — Ambulatory Visit
Admission: RE | Admit: 2015-11-05 | Discharge: 2015-11-05 | Disposition: A | Discharge status: Home / Self Care | Payer: BLUE CROSS/BLUE SHIELD | Source: Ambulatory Visit | Attending: Hematology and Oncology | Admitting: Hematology and Oncology

## 2015-11-05 ENCOUNTER — Other Ambulatory Visit: Payer: Self-pay | Admitting: Hematology and Oncology

## 2015-11-05 ENCOUNTER — Encounter: Payer: Self-pay | Admitting: Hematology and Oncology

## 2015-11-05 ENCOUNTER — Ambulatory Visit (HOSPITAL_BASED_OUTPATIENT_CLINIC_OR_DEPARTMENT_OTHER): Payer: BLUE CROSS/BLUE SHIELD | Admitting: Hematology and Oncology

## 2015-11-05 VITALS — BP 136/80 | HR 70 | Temp 97.9°F | Resp 19 | Wt 211.6 lb

## 2015-11-05 DIAGNOSIS — Z853 Personal history of malignant neoplasm of breast: Secondary | ICD-10-CM | POA: Diagnosis not present

## 2015-11-05 DIAGNOSIS — C50311 Malignant neoplasm of lower-inner quadrant of right female breast: Secondary | ICD-10-CM

## 2015-11-05 DIAGNOSIS — R635 Abnormal weight gain: Secondary | ICD-10-CM | POA: Diagnosis not present

## 2015-11-05 NOTE — Telephone Encounter (Signed)
appt made and avs printed °

## 2015-11-05 NOTE — Progress Notes (Signed)
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 IDC: Not enough tissue for Oncotype DX   02/20/2012 - 03/22/2012 Radiation Therapy Radiation therapy to lumpectomy site   04/05/2012 -  Anti-estrogen oral therapy Aromasin 25 mg once daily 5 years of therapy is the plan    CHIEF COMPLIANT: Follow-up on Aromasin  INTERVAL HISTORY: Elizabeth Berg is a 65 year old with above-mentioned history right breast cancer currently on Aromasin therapy and appears to be tolerating it fairly well. Her major complaint is severe weight gain. She has not been able to lose weight. Denies any lumps or nodules the breasts.  REVIEW OF SYSTEMS:   Constitutional: Denies fevers, chills or abnormal weight loss Eyes: Denies blurriness of vision Ears, nose, mouth, throat, and face: Denies mucositis or sore  throat Respiratory: Denies cough, dyspnea or wheezes Cardiovascular: Denies palpitation, chest discomfort Gastrointestinal:  Denies nausea, heartburn or change in bowel habits Skin: Denies abnormal skin rashes Lymphatics: Denies new lymphadenopathy or easy bruising Neurological:Denies numbness, tingling or new weaknesses Behavioral/Psych: Mood is stable, no new changes  Extremities: No lower extremity edema Breast:  denies any pain or lumps or nodules in either breasts All other systems were reviewed with the patient and are negative.  I have reviewed the past medical history, past surgical history, social history and family history with the patient and they are unchanged from previous note.  ALLERGIES:  is allergic to penicillins.  MEDICATIONS:  Current Outpatient Prescriptions  Medication Sig Dispense Refill  . exemestane (AROMASIN) 25 MG tablet TAKE 1 TABLET (25 MG TOTAL) BY MOUTH DAILY AFTER BREAKFAST. 90 tablet 3  . exemestane (AROMASIN) 25 MG tablet TAKE 1 TABLET (25 MG TOTAL) BY MOUTH DAILY AFTER BREAKFAST. 90 tablet 3  . levothyroxine (SYNTHROID, LEVOTHROID) 137 MCG tablet Take 137 mcg by mouth daily.    Marland Kitchen lisinopril (PRINIVIL,ZESTRIL) 10 MG tablet Take 10 mg by mouth daily.    Marland Kitchen omeprazole (PRILOSEC) 20 MG capsule Take 20 mg by mouth daily.    . rosuvastatin (CRESTOR) 10 MG tablet Take 10 mg by mouth daily.    . verapamil (CALAN) 120 MG tablet Take 120 mg by mouth daily.     No current facility-administered medications for this visit.    PHYSICAL EXAMINATION: ECOG PERFORMANCE STATUS: 1 - Symptomatic but completely ambulatory  Filed Vitals:   11/05/15 1135  BP: 136/80  Pulse: 70  Temp: 97.9 F (36.6 C)  Resp: 19  Filed Weights   11/05/15 1135  Weight: 211 lb 9.6 oz (95.981 kg)    GENERAL:alert, no distress and comfortable SKIN: skin color, texture, turgor are normal, no rashes or significant lesions EYES: normal, Conjunctiva are pink and non-injected, sclera  clear OROPHARYNX:no exudate, no erythema and lips, buccal mucosa, and tongue normal  NECK: supple, thyroid normal size, non-tender, without nodularity LYMPH:  no palpable lymphadenopathy in the cervical, axillary or inguinal LUNGS: clear to auscultation and percussion with normal breathing effort HEART: regular rate & rhythm and no murmurs and no lower extremity edema ABDOMEN:abdomen soft, non-tender and normal bowel sounds MUSCULOSKELETAL:no cyanosis of digits and no clubbing  NEURO: alert & oriented x 3 with fluent speech, no focal motor/sensory deficits EXTREMITIES: No lower extremity edema BREAST: No palpable masses or nodules in either right or left breasts. No palpable axillary supraclavicular or infraclavicular adenopathy no breast tenderness or nipple discharge. (exam performed in the presence of a chaperone)  LABORATORY DATA:  I have reviewed the data as listed   Chemistry      Component Value Date/Time   NA 142 10/28/2014 0815   NA 139 12/15/2011 1603   K 4.7 10/28/2014 0815   K 4.2 12/15/2011 1603   CL 104 01/05/2013 0822   CL 99 12/15/2011 1603   CO2 30* 10/28/2014 0815   CO2 31 12/15/2011 1603   BUN 22.1 10/28/2014 0815   BUN 15 12/15/2011 1603   CREATININE 1.0 10/28/2014 0815   CREATININE 0.79 12/15/2011 1603      Component Value Date/Time   CALCIUM 9.1 10/28/2014 0815   CALCIUM 9.2 12/15/2011 1603   ALKPHOS 83 10/28/2014 0815   ALKPHOS 67 12/01/2011 0755   AST 22 10/28/2014 0815   AST 18 12/01/2011 0755   ALT 22 10/28/2014 0815   ALT 17 12/01/2011 0755   BILITOT 0.57 10/28/2014 0815   BILITOT 0.5 12/01/2011 0755       Lab Results  Component Value Date   WBC 5.0 10/28/2014   HGB 14.7 10/28/2014   HCT 44.2 10/28/2014   MCV 86.1 10/28/2014   PLT 223 10/28/2014   NEUTROABS 3.2 10/28/2014     ASSESSMENT & PLAN:  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 N0 M0 stage IA ER/PR positive HER-2 negative status  post lumpectomy 12/20/2011 and radiation currently on antiestrogen therapy with Aromasin 04/05/2012  Aromasin toxicities: 1. Occasional hot flashes 2. Mood swings: Much improved since last year 3. Mild myalgias Our plan is to discontinue Aromasin after 5 years Breast cancer surveillance: 1. Breast exam 11/05/2015 is normal 2. Mammogram 11/05/2015 is normal category B density  Survivorship:Discussed the importance of physical exercise in decreasing the likelihood of breast cancer recurrence. Recommended 30 mins daily 6 days a week of either brisk walking or cycling or swimming. Encouraged patient to eat more fruits and vegetables and decrease red meat.   Patient has joined the weight loss program and is attempting to lose some weight. Return to clinic in 1 year   No orders of the defined types were placed in this encounter.   The patient has a good understanding of the overall plan. she agrees with it. she will call with any problems that may develop before the next visit here.   Rulon Eisenmenger, MD 11/05/2015

## 2015-11-05 NOTE — Assessment & Plan Note (Signed)
Right breast cancer T1 C. N0 M0 stage IA ER/PR positive HER-2 negative status post lumpectomy 12/20/2011 and radiation currently on antiestrogen therapy with Aromasin 04/05/2012  Aromasin toxicities: 1. Occasional hot flashes 2. Mood swings: Much improved since last year 3. Mild myalgias Our plan is to discontinue Aromasin after 5 years Breast cancer surveillance: 1. Breast exam 11/05/2015 is normal 2. Mammogram 11/05/2015 is normal category B density  Survivorship:Discussed the importance of physical exercise in decreasing the likelihood of breast cancer recurrence. Recommended 30 mins daily 6 days a week of either brisk walking or cycling or swimming. Encouraged patient to eat more fruits and vegetables and decrease red meat.   Patient has joined the weight loss program and is attempting to lose some weight. Return to clinic in 1 year

## 2016-01-28 DIAGNOSIS — E559 Vitamin D deficiency, unspecified: Secondary | ICD-10-CM | POA: Diagnosis not present

## 2016-01-28 DIAGNOSIS — I10 Essential (primary) hypertension: Secondary | ICD-10-CM | POA: Diagnosis not present

## 2016-02-09 DIAGNOSIS — E890 Postprocedural hypothyroidism: Secondary | ICD-10-CM | POA: Diagnosis not present

## 2016-02-09 DIAGNOSIS — E785 Hyperlipidemia, unspecified: Secondary | ICD-10-CM | POA: Diagnosis not present

## 2016-02-09 DIAGNOSIS — I10 Essential (primary) hypertension: Secondary | ICD-10-CM | POA: Diagnosis not present

## 2016-02-09 DIAGNOSIS — E559 Vitamin D deficiency, unspecified: Secondary | ICD-10-CM | POA: Diagnosis not present

## 2016-02-09 DIAGNOSIS — K219 Gastro-esophageal reflux disease without esophagitis: Secondary | ICD-10-CM | POA: Diagnosis not present

## 2016-02-11 DIAGNOSIS — I788 Other diseases of capillaries: Secondary | ICD-10-CM | POA: Diagnosis not present

## 2016-02-16 DIAGNOSIS — E890 Postprocedural hypothyroidism: Secondary | ICD-10-CM | POA: Diagnosis not present

## 2016-02-29 ENCOUNTER — Other Ambulatory Visit: Payer: Self-pay | Admitting: Hematology and Oncology

## 2016-02-29 DIAGNOSIS — C50311 Malignant neoplasm of lower-inner quadrant of right female breast: Secondary | ICD-10-CM

## 2016-03-01 NOTE — Telephone Encounter (Signed)
Chart reviewed.

## 2016-05-27 DIAGNOSIS — Z853 Personal history of malignant neoplasm of breast: Secondary | ICD-10-CM | POA: Diagnosis not present

## 2016-05-27 DIAGNOSIS — D171 Benign lipomatous neoplasm of skin and subcutaneous tissue of trunk: Secondary | ICD-10-CM | POA: Diagnosis not present

## 2016-06-01 ENCOUNTER — Ambulatory Visit (INDEPENDENT_AMBULATORY_CARE_PROVIDER_SITE_OTHER): Payer: BLUE CROSS/BLUE SHIELD | Admitting: Cardiology

## 2016-06-01 ENCOUNTER — Encounter: Payer: Self-pay | Admitting: Cardiology

## 2016-06-01 VITALS — BP 138/96 | HR 74 | Ht 64.0 in | Wt 209.2 lb

## 2016-06-01 DIAGNOSIS — R002 Palpitations: Secondary | ICD-10-CM

## 2016-06-01 DIAGNOSIS — E6609 Other obesity due to excess calories: Secondary | ICD-10-CM | POA: Diagnosis not present

## 2016-06-01 DIAGNOSIS — E669 Obesity, unspecified: Secondary | ICD-10-CM | POA: Insufficient documentation

## 2016-06-01 DIAGNOSIS — C50311 Malignant neoplasm of lower-inner quadrant of right female breast: Secondary | ICD-10-CM | POA: Diagnosis not present

## 2016-06-01 DIAGNOSIS — I10 Essential (primary) hypertension: Secondary | ICD-10-CM

## 2016-06-01 MED ORDER — METOPROLOL TARTRATE 50 MG PO TABS
50.0000 mg | ORAL_TABLET | Freq: Two times a day (BID) | ORAL | 3 refills | Status: DC
Start: 2016-06-01 — End: 2016-06-01

## 2016-06-01 MED ORDER — METOPROLOL TARTRATE 25 MG PO TABS: 12.5000 mg | ORAL_TABLET | Freq: Two times a day (BID) | ORAL | 3 refills | Status: DC

## 2016-06-01 NOTE — Patient Instructions (Signed)
Medication Instructions:  START Metoprolol 12.5mg  Take 1 tablet by mouth twice a day as needed for palpitations  Labwork: None   Testing/Procedures: None   Follow-Up: Your physician recommends that you schedule a follow-up appointment in: [place].  Any Other Special Instructions Will Be Listed Below (If Applicable).     If you need a refill on your cardiac medications before your next appointment, please call your pharmacy.

## 2016-06-01 NOTE — Progress Notes (Signed)
06/01/2016 [employer] Posten Eisenberg   04/20/1951  [id number]  Primary Physician Thressa Sheller, MD Primary Cardiologist: Dr Sallyanne Kuster  HPI:  65 y/o obese Caucasian female seen by [name], then followed by Dr Sallyanne Kuster for palpitations. Her husband is also a pt of Dr [employer]. She has been on Verapamil for years. She always has palpations but this has been more of a problem in the past few weeks. She admits she has been under stress- they are moving and she says she can very irritable since her hormones were stopped by her oncologist. She has frequent "hard" beats at all times of the day, "it's running my life". She denies any sustained tachycardia. She denies any caffeine use or OTC decongestants.    Current Outpatient Prescriptions  Medication Sig Dispense Refill  . exemestane (AROMASIN) 25 MG tablet TAKE 1 TABLET (25 MG TOTAL) BY MOUTH DAILY AFTER BREAKFAST. 90 tablet 3  . levothyroxine (SYNTHROID, LEVOTHROID) 137 MCG tablet Take 137 mcg by mouth daily.    Marland Kitchen lisinopril (PRINIVIL,ZESTRIL) 10 MG tablet Take 10 mg by mouth daily.    Marland Kitchen omeprazole (PRILOSEC) 20 MG capsule Take 20 mg by mouth daily.    . rosuvastatin (CRESTOR) 10 MG tablet Take 10 mg by mouth daily.    . verapamil (CALAN) 120 MG tablet Take 120 mg by mouth daily.    . metoprolol tartrate (LOPRESSOR) 25 MG tablet Take 0.5 tablets (12.5 mg total) by mouth 2 (two) times daily. 60 tablet 3   No current facility-administered medications for this visit.     Allergies  Allergen Reactions  . Penicillins     Childhood allergy; reaction unknown    Social History   Social History  . Marital status: Married    Spouse name: N/A  . Number of children: N/A  . Years of education: N/A   Occupational History  . Not on file.   Social History Main Topics  . Smoking status: Never Smoker  . Smokeless tobacco: Never Used  . Alcohol use No  . Drug use: No  . Sexual activity: Yes   Other Topics Concern  . Not on file   Social  History Narrative  . No narrative on file     Review of Systems: General: negative for chills, fever, night sweats or weight changes.  Cardiovascular: negative for chest pain, dyspnea on exertion, edema, orthopnea, palpitations, paroxysmal nocturnal dyspnea or shortness of breath Dermatological: negative for rash Respiratory: negative for cough or wheezing Urologic: negative for hematuria Abdominal: negative for nausea, vomiting, diarrhea, bright red blood per rectum, melena, or hematemesis Neurologic: negative for visual changes, syncope, or dizziness All other systems reviewed and are otherwise negative except as noted above.    Blood pressure (!) 138/96, pulse 74, height 5\' 4"  (1.626 m), weight 209 lb 3.2 oz (94.9 kg), last menstrual period 12/20/2006.  General appearance: alert, cooperative, no distress and mildly obese Neck: no carotid bruit and no JVD Lungs: clear to auscultation bilaterally Heart: regular rate and rhythm Extremities: no edema Skin: Skin color, texture, turgor normal. No rashes or lesions Neurologic: Grossly normal  EKG NSR  ASSESSMENT AND PLAN:   Palpitations Pt in the office today with complains of increasing palpations over the past month.  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 She said she had a sleep study in the past but C-pap was not prescribed   PLAN  I suggested she take Metoprolol 12.5 mg BID PRN when she is having a bad period of palpitations as she is now. If she has sustained tachycardia, increased dyspnea, or other symptoms such as chest pain I would proceed with an echo and sleep study (I could not find a report of sleep study in EPIC).  Follow up with Dr Sallyanne Kuster in three months.   Kerin Ransom PA-C 06/01/2016 4:47 PM

## 2016-06-01 NOTE — Assessment & Plan Note (Signed)
Slightly elevated today.  

## 2016-06-01 NOTE — Assessment & Plan Note (Signed)
Pt in the office today with complains of increasing palpations over the past month.

## 2016-06-01 NOTE — Assessment & Plan Note (Signed)
BMI 35. She said she had a sleep study in the past but C-pap was not prescribed

## 2016-06-01 NOTE — Assessment & Plan Note (Signed)
S/P surgery and radiation 2013

## 2016-06-03 ENCOUNTER — Other Ambulatory Visit: Payer: Self-pay

## 2016-06-03 MED ORDER — METOPROLOL TARTRATE 25 MG PO TABS: 12.5000 mg | ORAL_TABLET | Freq: Two times a day (BID) | ORAL | 3 refills | Status: DC

## 2016-06-07 ENCOUNTER — Other Ambulatory Visit: Payer: Self-pay

## 2016-06-07 MED ORDER — METOPROLOL TARTRATE 25 MG PO TABS: 12.5000 mg | ORAL_TABLET | Freq: Two times a day (BID) | ORAL | 3 refills | Status: DC

## 2016-06-17 ENCOUNTER — Telehealth: Payer: Self-pay | Admitting: Cardiovascular Disease

## 2016-06-17 DIAGNOSIS — R002 Palpitations: Secondary | ICD-10-CM

## 2016-06-17 NOTE — Telephone Encounter (Signed)
Returned call to patient. States she sees Dr. Sallyanne Kuster every now and then, is on verapamil to help w palpitations, which she's had for years, notes lately the palps have become more noticeable. She was seen for eval by Lurena Joiner 2 weeks ago and started on metoprolol PRN. Pt acknowledges a possible emotional stress component to the increase in symptoms.   From Luke's notes at visit:  PLAN  I suggested she take Metoprolol 12.5 mg BID PRN when she is having a bad period of palpitations as she is now. If she has sustained tachycardia, increased dyspnea, or other symptoms such as chest pain I would proceed with an echo and sleep study (I could not find a report of sleep study in EPIC).  Follow up with Dr Sallyanne Kuster in three months.    Pt asking if feasible to order echo. Aware I will check w Dr. Loletha Grayer today regarding this, and return her call w recommendations.

## 2016-06-17 NOTE — Telephone Encounter (Signed)
No answer, goes to VM. Left msg for patient w details (OK per DPR) - explained Dr. Sallyanne Kuster recommended echo and I have entered order, she should anticipate a scheduler to call her and set this up but if she has questions she may call us.

## 2016-06-17 NOTE — Telephone Encounter (Signed)
Pt was in 06-01-16 and Lurena Joiner discussed a test with her, she thinks Echo, and has decided to do it, heart still skipping beats, denies any other symptoms  -need order-  Pt # [phone]

## 2016-06-17 NOTE — Telephone Encounter (Signed)
Yes, OK to order echo for palpitations.

## 2016-06-30 ENCOUNTER — Ambulatory Visit (HOSPITAL_COMMUNITY): Payer: BLUE CROSS/BLUE SHIELD | Attending: Cardiology

## 2016-06-30 ENCOUNTER — Other Ambulatory Visit: Payer: Self-pay

## 2016-06-30 DIAGNOSIS — I340 Nonrheumatic mitral (valve) insufficiency: Secondary | ICD-10-CM | POA: Insufficient documentation

## 2016-06-30 DIAGNOSIS — R002 Palpitations: Secondary | ICD-10-CM | POA: Insufficient documentation

## 2016-07-07 ENCOUNTER — Other Ambulatory Visit: Payer: Self-pay | Admitting: Obstetrics and Gynecology

## 2016-07-07 DIAGNOSIS — Z6835 Body mass index (BMI) 35.0-35.9, adult: Secondary | ICD-10-CM | POA: Diagnosis not present

## 2016-07-07 DIAGNOSIS — Z124 Encounter for screening for malignant neoplasm of cervix: Secondary | ICD-10-CM | POA: Diagnosis not present

## 2016-07-07 DIAGNOSIS — Z01419 Encounter for gynecological examination (general) (routine) without abnormal findings: Secondary | ICD-10-CM | POA: Diagnosis not present

## 2016-07-08 LAB — CYTOLOGY - PAP

## 2016-07-13 ENCOUNTER — Other Ambulatory Visit (HOSPITAL_COMMUNITY): Payer: BLUE CROSS/BLUE SHIELD

## 2016-08-10 DIAGNOSIS — E785 Hyperlipidemia, unspecified: Secondary | ICD-10-CM | POA: Diagnosis not present

## 2016-08-11 DIAGNOSIS — K219 Gastro-esophageal reflux disease without esophagitis: Secondary | ICD-10-CM | POA: Diagnosis not present

## 2016-08-11 DIAGNOSIS — I10 Essential (primary) hypertension: Secondary | ICD-10-CM | POA: Diagnosis not present

## 2016-08-11 DIAGNOSIS — Z0000 Encounter for general adult medical examination without abnormal findings: Secondary | ICD-10-CM | POA: Diagnosis not present

## 2016-08-11 DIAGNOSIS — E785 Hyperlipidemia, unspecified: Secondary | ICD-10-CM | POA: Diagnosis not present

## 2016-08-31 DIAGNOSIS — Z85828 Personal history of other malignant neoplasm of skin: Secondary | ICD-10-CM | POA: Diagnosis not present

## 2016-08-31 DIAGNOSIS — L821 Other seborrheic keratosis: Secondary | ICD-10-CM | POA: Diagnosis not present

## 2016-08-31 DIAGNOSIS — D2262 Melanocytic nevi of left upper limb, including shoulder: Secondary | ICD-10-CM | POA: Diagnosis not present

## 2016-08-31 DIAGNOSIS — D2261 Melanocytic nevi of right upper limb, including shoulder: Secondary | ICD-10-CM | POA: Diagnosis not present

## 2016-08-31 DIAGNOSIS — L820 Inflamed seborrheic keratosis: Secondary | ICD-10-CM | POA: Diagnosis not present

## 2016-09-03 ENCOUNTER — Ambulatory Visit: Payer: BLUE CROSS/BLUE SHIELD | Admitting: Cardiovascular Disease

## 2016-09-20 ENCOUNTER — Other Ambulatory Visit: Payer: Self-pay | Admitting: Hematology and Oncology

## 2016-09-20 ENCOUNTER — Telehealth: Payer: Self-pay | Admitting: Hematology and Oncology

## 2016-09-20 DIAGNOSIS — Z853 Personal history of malignant neoplasm of breast: Secondary | ICD-10-CM

## 2016-09-20 NOTE — Telephone Encounter (Signed)
Pt called to move her f/u until after her mammo appt 4/27

## 2016-10-22 ENCOUNTER — Ambulatory Visit: Payer: BLUE CROSS/BLUE SHIELD | Admitting: Cardiovascular Disease

## 2016-11-04 ENCOUNTER — Ambulatory Visit: Payer: BLUE CROSS/BLUE SHIELD | Admitting: Hematology and Oncology

## 2016-11-05 ENCOUNTER — Ambulatory Visit (INDEPENDENT_AMBULATORY_CARE_PROVIDER_SITE_OTHER): Payer: BLUE CROSS/BLUE SHIELD

## 2016-11-05 ENCOUNTER — Ambulatory Visit (INDEPENDENT_AMBULATORY_CARE_PROVIDER_SITE_OTHER): Payer: BLUE CROSS/BLUE SHIELD | Admitting: Podiatry

## 2016-11-05 ENCOUNTER — Encounter: Payer: Self-pay | Admitting: Podiatry

## 2016-11-05 ENCOUNTER — Telehealth: Payer: Self-pay | Admitting: *Deleted

## 2016-11-05 VITALS — Resp 16 | Ht 64.0 in | Wt 200.0 lb

## 2016-11-05 DIAGNOSIS — M2042 Other hammer toe(s) (acquired), left foot: Secondary | ICD-10-CM

## 2016-11-05 DIAGNOSIS — M2010 Hallux valgus (acquired), unspecified foot: Secondary | ICD-10-CM

## 2016-11-05 DIAGNOSIS — M2020 Hallux rigidus, unspecified foot: Secondary | ICD-10-CM | POA: Diagnosis not present

## 2016-11-05 DIAGNOSIS — M2041 Other hammer toe(s) (acquired), right foot: Secondary | ICD-10-CM

## 2016-11-05 NOTE — Patient Instructions (Addendum)
Bunionectomy A bunionectomy is a surgical procedure to remove a bunion. A bunion is a visible bump of bone on the inside of your foot where your big toe meets the rest of your foot. A bunion can develop when pressure turns this bone (first metatarsal) toward the other toes. Shoes that are too tight are the most common cause of bunions. Bunions can also be caused by diseases, such as arthritis and polio. You may need a bunionectomy if your bunion is very large and painful or it affects your ability to walk. Tell a health care provider about:  Any allergies you have.  All medicines you are taking, including vitamins, herbs, eye drops, creams, and over-the-counter medicines.  Any problems you or family members have had with anesthetic medicines.  Any blood disorders you have.  Any surgeries you have had.  Any medical conditions you have. What are the risks? Generally, this is a safe procedure. However, problems may occur, including:  Infection.  Pain.  Nerve damage.  Bleeding or blood clots.  Reactions to medicines.  Numbness, stiffness, or arthritis in your toe.  Foot problems that continue even after the procedure. What happens before the procedure?  Ask your health care provider about:  Changing or stopping your regular medicines. This is especially important if you are taking diabetes medicines or blood thinners.  Taking medicines such as aspirin and ibuprofen. These medicines can thin your blood. Do not take these medicines before your procedure if your health care provider instructs you not to.  Do not drink alcohol before the procedure as directed by your health care provider.  Do not use tobacco products, including cigarettes, chewing tobacco, or electronic cigarettes, before the procedure as directed by your health care provider. If you need help quitting, ask your health care provider.  Ask your health care provider what kind of medicine you will be given during  your procedure. A bunionectomy may be done using one of these:  A medicine that numbs the area (local anesthetic).  A medicine that makes you go to sleep (general anesthetic). If you will be given general anesthetic, do not eat or drink anything after midnight on the night before the procedure or as directed by your health care provider. What happens during the procedure?  An IV tube may be inserted into a vein.  You will be given local anesthetic or general anesthetic.  The surgeon will make a cut (incision) over the enlarged area at the first joint of the big toe. The surgeon will remove the bunion.  You may have more than one incision if any of the bones in your big toe need to be moved. A bone itself may need to be cut.  Sometimes the tissues around the big toe may also need to be cut then tightened or loosened to reposition the toe.  Screws or other hardware may be used to keep your foot in thecorrect position.  The incision will be closed with stitches (sutures) and covered with adhesive strips or another type of bandage (dressing). What happens after the procedure?  You may spend some time in a recovery area.  Your blood pressure, heart rate, breathing rate, and blood oxygen level will be monitored often until the medicines you were given have worn off. This information is not intended to replace advice given to you by your health care provider. Make sure you discuss any questions you have with your health care provider. Document Released: 06/18/2005 Document Revised: 12/11/2015 Document Reviewed: 02/20/2014   Elsevier Interactive Patient Education  2017 [place] Instructions  Congratulations, you have decided to take an important step to improving your quality of life.  You can be assured that the doctors of [facility] will be with you every step of the way.  1. Plan to be at the surgery center/hospital at least 1 (one) hour prior to your  scheduled time unless otherwise directed by the surgical center/hospital staff.  You must have a responsible adult accompany you, remain during the surgery and drive you home.  Make sure you have directions to the surgical center/hospital and know how to get there on time. 2. For hospital based surgery you will need to obtain a history and physical form from your family physician within 1 month prior to the date of surgery- we will give you a form for you primary physician.  3. We make every effort to accommodate the date you request for surgery.  There are however, times where surgery dates or times have to be moved.  We will contact you as soon as possible if a change in schedule is required.   4. No Aspirin/Ibuprofen for one week before surgery.  If you are on aspirin, any non-steroidal anti-inflammatory medications (Mobic, Aleve, Ibuprofen) you should stop taking it 7 days prior to your surgery.  You make take Tylenol  For pain prior to surgery.  5. Medications- If you are taking daily heart and blood pressure medications, seizure, reflux, allergy, asthma, anxiety, pain or diabetes medications, make sure the surgery center/hospital is aware before the day of surgery so they may notify you which medications to take or avoid the day of surgery. 6. No food or drink after midnight the night before surgery unless directed otherwise by surgical center/hospital staff. 7. No alcoholic beverages 24 hours prior to surgery.  No smoking 24 hours prior to or 24 hours after surgery. 8. Wear loose pants or shorts- loose enough to fit over bandages, boots, and casts. 9. No slip on shoes, sneakers are best. 10. Bring your boot with you to the surgery center/hospital.  Also bring crutches or a walker if your physician has prescribed it for you.  If you do not have this equipment, it will be provided for you after surgery. 11. If you have not been contracted by the surgery center/hospital by the day before your surgery,  call to confirm the date and time of your surgery. 12. Leave-time from work may vary depending on the type of surgery you have.  Appropriate arrangements should be made prior to surgery with your employer. 13. Prescriptions will be provided immediately following surgery by your doctor.  Have these filled as soon as possible after surgery and take the medication as directed. 14. Remove nail polish on the operative foot. 15. Wash the night before surgery.  The night before surgery wash the foot and leg well with the antibacterial soap provided and water paying special attention to beneath the toenails and in between the toes.  Rinse thoroughly with water and dry well with a towel.  Perform this wash unless told not to do so by your physician.  Enclosed: 1 Ice pack (please put in freezer the night before surgery)   1 Hibiclens skin cleaner   Pre-op Instructions  If you have any questions regarding the instructions, do not hesitate to call our office.  [place]: [place], [place] [address] [place]: [address], [place], [place] [address] [phone]  [place]:  [place], [place] [address] [phone]   Dr. Ila Mcgill DPM, Dr. Celesta Gentile DPM, Dr. Lanelle Bal DPM, Dr. Landis Martins DPM

## 2016-11-05 NOTE — Progress Notes (Signed)
   Subjective:    Patient ID: Elizabeth Berg, female    DOB: 1950-09-26, 66 y.o.   MRN: [id number]  HPI Chief Complaint  Patient presents with  . Foot Pain    Bilateral; Bunions; pt stated, "Left foot is worse"; x6+ yrs  . Nail Problem    Bilateral; great toes; nail discoloration; pt stated, "wants to have checked"; x1 yr      Review of Systems  All other systems reviewed and are negative.      Objective:   Physical Exam        Assessment & Plan:

## 2016-11-05 NOTE — Telephone Encounter (Signed)
"  I was in there and scheduled a surgery.  I need to tweak that."

## 2016-11-05 NOTE — Progress Notes (Addendum)
Subjective:     Patient ID: Elizabeth Berg, female   DOB: 1950-08-05, 66 y.o.   MRN: [id number]  HPI patient presents with increased discomfort in the left big toe joint knowing that she's needed to get it fixed and states she cannot take the pain anymore and also has pain in the distal third digit left that makes it hard at times to be comfortable. Patient states she's tried wider shoes and other modalities has some problems with the right foot but not to the same degree   Review of Systems  All other systems reviewed and are negative.      Objective:   Physical Exam  Constitutional: She is oriented to person, place, and time.  Cardiovascular: Intact distal pulses.   Musculoskeletal: Normal range of motion.  Neurological: She is oriented to person, place, and time.  Skin: Skin is warm.  Nursing note and vitals reviewed.  neurovascular status intact muscle strength adequate range of motion within normal limits with patient found to have discomfort and pain of the left first MPJ with reduced range of motion dorsal spurring and crepitus within the joint. Right has reduced range of motion but not to the same degree with crepitus dorsally and mild discomfort in the third digit left is found to have internal rotation and hypertrophy of the distal interphalangeal joint with redness. 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 At this point I do think given the long-term nature condition respond to conservative care and worsening of symptoms that this would do well with a osteotomy and I reviewed biplanar osteotomy with decompression and possible osteochondral bone drilling and also discussed possibility that the cartilage  we bad and ultimately will require joint implantation or fusion. Patient understands all this wants surgery and also wants the third distal joint fixed left foot and at this time I allowed her to read consent form reviewing alternative treatments complications. She signs consent form after extensive review scheduled for outpatient surgery and was dispensed air fracture walker with all instructions on usage and is encouraged to call with any questions prior to procedure   X-ray indicates that there is dorsal spurring left over right first metatarsal with narrowing of the joint surface left over right with changes on the lateral side of the joint and also noted to have distal changes digit 3 left of the middle phalanx

## 2016-11-09 ENCOUNTER — Telehealth: Payer: Self-pay | Admitting: Podiatry

## 2016-11-09 NOTE — Telephone Encounter (Signed)
Pt called and needs to change her surgery to 5.22.18 with Dr Regal.Please call pt to confirm.

## 2016-11-09 NOTE — Telephone Encounter (Signed)
"  I left a message Friday.  If you would please give me a call back.  I need to reschedule my surgery."  I attempted to return her call.  I left her a message to call me back.  "Sorry I missed your call.  I had just stepped away."  You want to reschedule your surgery date from 12/07/2016.  "No, that's the date I want to schedule it for."  We already have you down for that date.  "I don't know when you could have but okay, that is the date I would like to have.  What do I need to do about my workers comp?  Do I contact them or do you do that?  I have BCBS."  You will need to contact your human resources about FMLA.

## 2016-11-12 ENCOUNTER — Ambulatory Visit
Admission: RE | Admit: 2016-11-12 | Discharge: 2016-11-12 | Disposition: A | Discharge status: Home / Self Care | Payer: BLUE CROSS/BLUE SHIELD | Source: Ambulatory Visit | Attending: Hematology and Oncology | Admitting: Hematology and Oncology

## 2016-11-12 ENCOUNTER — Ambulatory Visit: Payer: BLUE CROSS/BLUE SHIELD | Admitting: Hematology and Oncology

## 2016-11-12 DIAGNOSIS — Z853 Personal history of malignant neoplasm of breast: Secondary | ICD-10-CM | POA: Diagnosis not present

## 2016-11-12 DIAGNOSIS — R928 Other abnormal and inconclusive findings on diagnostic imaging of breast: Secondary | ICD-10-CM | POA: Diagnosis not present

## 2016-11-12 HISTORY — DX: Personal history of irradiation: Z92.3

## 2016-11-22 ENCOUNTER — Telehealth: Payer: Self-pay | Admitting: *Deleted

## 2016-11-22 NOTE — Telephone Encounter (Signed)
I'm returning your call.  How can I help you?  "I am going to talk to them about being out.  I just need to know what to tell them.  I didn't know if they were going to ask me what surgery I was having or what.  Also I am scheduled to be out for 7 days.  I think they told me I would have to be out for 8 days.  So, who do I talk to about that?"  You need to speak with Elizabeth Berg and you can have them fax information to [phone].  You are having an Biomedical engineer.  I can transfer you to [employer].  "That would be great thank you."

## 2016-11-22 NOTE — Telephone Encounter (Signed)
"  I have surgery on the 22nd of this month.  Please return my call."

## 2016-11-23 ENCOUNTER — Telehealth: Payer: Self-pay | Admitting: *Deleted

## 2016-11-23 NOTE — Telephone Encounter (Signed)
"  I have a couple of questions about the procedure she's going in for, bunionectomy and hammer toe.  Please return my call. "

## 2016-11-24 NOTE — Telephone Encounter (Signed)
I'm returning your call.  How can I help you?  "I was calling you about the patient's surgery scheduled for 12/07/2016.  She stated she is having a Bunionectomy and Hammer Toe Surgery.  The bunionectomy is not covered if the diagnosis code is M20.10.  Is the diagnosis Hammer Toe?"  No, it is not Hammer Toe.  How about M20.20?  "No, that diagnosis is not covered.  I can't tell you what code will cover."  I will check with the doctor and see what he says.    I called and spoke to [place] at [facility] to check benefits and to see if authorization is needed for patient's surgery.  She stated that authorization is not needed.  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  No pre-certification is needed.  Reference number for the call is [id number].  I called and informed the patient that authorization is not required for the 28296 with diagnosis of M20.20 nor 28285.  I gave her the reference number and the name of the person I spoke with at [facility].

## 2016-11-25 ENCOUNTER — Telehealth: Payer: Self-pay | Admitting: *Deleted

## 2016-11-25 NOTE — Assessment & Plan Note (Addendum)
Right breast cancer T1 C. N0 M0 stage IA ER/PR positive HER-2 negative status post lumpectomy 12/20/2011 and radiation currently on antiestrogen therapy with Aromasin 04/05/2012  Aromasin toxicities: 1. Occasional hot flashes 2. Mood swings: Much improved since last year 3. Mild myalgias Our plan is to discontinue Aromasin after 5 years  Breast cancer surveillance: 1. Breast exam 11/25/2016 is normal 2. Mammogram 11/12/2016 is normal category B density  Patient has joined the weight loss program and is attempting to lose some weight. Return to clinic in 1 year

## 2016-11-25 NOTE — Telephone Encounter (Signed)
"  Patient said she has an upcoming surgery.  She is asking for benefits.  I am calling to see where the surgery will be performed, patient said she was not aware."  Surgery will be performed at [facility].  "Are they in-network with BCBS?"  Yes, they are in network with [place].  "That's all I needed."

## 2016-11-26 ENCOUNTER — Encounter: Payer: Self-pay | Admitting: Hematology and Oncology

## 2016-11-26 ENCOUNTER — Ambulatory Visit (HOSPITAL_BASED_OUTPATIENT_CLINIC_OR_DEPARTMENT_OTHER): Payer: BLUE CROSS/BLUE SHIELD | Admitting: Hematology and Oncology

## 2016-11-26 DIAGNOSIS — C50311 Malignant neoplasm of lower-inner quadrant of right female breast: Secondary | ICD-10-CM

## 2016-11-26 NOTE — Progress Notes (Signed)
Patient Care Team: Thressa Sheller, MD as PCP - General (Internal Medicine) Thressa Sheller, MD as Consulting Physician (Internal Medicine) Croitoru, Dani Gobble, MD as Consulting Physician (Cardiology) Olga Millers, MD as Consulting Physician (Obstetrics and Gynecology) Juanita Craver, MD as Consulting Physician (Gastroenterology) Jacelyn Pi, MD as Consulting Physician (Endocrinology) Marcy Panning, MD as Consulting Physician (Hematology and Oncology) Thea Silversmith, MD (Inactive) as Consulting Physician (Radiation Oncology) Jesusita Oka, RN as Registered Nurse  DIAGNOSIS:  Encounter Diagnosis  Name Primary?  . Primary cancer of lower-inner quadrant of right female breast ([place])     SUMMARY OF ONCOLOGIC HISTORY:   Primary cancer of lower-inner quadrant of right female breast ([place])   11/25/2011 Initial Diagnosis    Cancer of lower-inner quadrant of female breast      12/20/2011 Surgery    Right lumpectomy with SLN biopsy 1.8 cm ER 100%, EF 37%, HER-2 negative, Ki-67 90%, grade 1, one sentinel lymph node negative stage I A. IDC: Not enough tissue for Oncotype DX      02/20/2012 - 03/22/2012 Radiation Therapy    Radiation therapy to lumpectomy site      04/05/2012 -  Anti-estrogen oral therapy    Aromasin 25 mg once daily 5 years of therapy is the plan       CHIEF COMPLIANT: Follow-up on Aromasin therapy  INTERVAL HISTORY: Elizabeth Berg is a 66 year old with above-mentioned history of right breast cancer treated with lumpectomy and radiation and is currently on Aromasin since September 2013. She is very excited to be completing 5 years of treatment soon. She reports be tolerating the treatment extremely well. Denies any hot flashes or myalgias.  REVIEW OF SYSTEMS:   Constitutional: Denies fevers, chills or abnormal weight loss Eyes: Denies blurriness of vision Ears, nose, mouth, throat, and face: Denies mucositis or sore throat Respiratory: Denies cough, dyspnea or  wheezes Cardiovascular: Denies palpitation, chest discomfort Gastrointestinal:  Denies nausea, heartburn or change in bowel habits Skin: Denies abnormal skin rashes Lymphatics: Denies new lymphadenopathy or easy bruising Neurological:Denies numbness, tingling or new weaknesses Behavioral/Psych: Mood is stable, no new changes  Extremities: No lower extremity edema Breast:  denies any pain or lumps or nodules in either breasts All other systems were reviewed with the patient and are negative.  I have reviewed the past medical history, past surgical history, social history and family history with the patient and they are unchanged from previous note.  ALLERGIES:  is allergic to penicillins.  MEDICATIONS:  Current Outpatient Prescriptions  Medication Sig Dispense Refill  . exemestane (AROMASIN) 25 MG tablet TAKE 1 TABLET (25 MG TOTAL) BY MOUTH DAILY AFTER BREAKFAST. 90 tablet 3  . levothyroxine (SYNTHROID, LEVOTHROID) 137 MCG tablet Take 137 mcg by mouth daily.    Marland Kitchen lisinopril (PRINIVIL,ZESTRIL) 10 MG tablet Take 10 mg by mouth daily.    . metoprolol tartrate (LOPRESSOR) 25 MG tablet Take 0.5 tablets (12.5 mg total) by mouth 2 (two) times daily. (Patient taking differently: Take 12.5 mg by mouth as needed. ) 180 tablet 3  . omeprazole (PRILOSEC) 20 MG capsule Take 20 mg by mouth daily.    . rosuvastatin (CRESTOR) 10 MG tablet Take 10 mg by mouth daily.    . verapamil (CALAN) 120 MG tablet Take 120 mg by mouth daily.     No current facility-administered medications for this visit.     PHYSICAL EXAMINATION: ECOG PERFORMANCE STATUS: 0 - Asymptomatic  Vitals:   11/26/16 1033  BP: (!) 147/88  Pulse: 72  Resp: 18  Temp: 98.2 F (36.8 C)   Filed Weights   11/26/16 1033  Weight: 208 lb 8 oz (94.6 kg)    GENERAL:alert, no distress and comfortable SKIN: skin color, texture, turgor are normal, no rashes or significant lesions EYES: normal, Conjunctiva are pink and non-injected, sclera  clear OROPHARYNX:no exudate, no erythema and lips, buccal mucosa, and tongue normal  NECK: supple, thyroid normal size, non-tender, without nodularity LYMPH:  no palpable lymphadenopathy in the cervical, axillary or inguinal LUNGS: clear to auscultation and percussion with normal breathing effort HEART: regular rate & rhythm and no murmurs and no lower extremity edema ABDOMEN:abdomen soft, non-tender and normal bowel sounds MUSCULOSKELETAL:no cyanosis of digits Is here in no clubbing  NEURO: alert & oriented x 3 with fluent speech, no focal motor/sensory deficits EXTREMITIES: No lower extremity edema BREAST: Her No palpable masses or nodules in either right or left breasts. No palpable axillary supraclavicular or infraclavicular adenopathy no breast tenderness or nipple discharge. (exam performed in the presence of a chaperone)  LABORATORY DATA:  I have reviewed the data as listed   Chemistry      Component Value Date/Time   NA 142 10/28/2014 0815   K 4.7 10/28/2014 0815   CL 104 01/05/2013 0822   CO2 30 (H) 10/28/2014 0815   BUN 22.1 10/28/2014 0815   CREATININE 1.0 10/28/2014 0815      Component Value Date/Time   CALCIUM 9.1 10/28/2014 0815   ALKPHOS 83 10/28/2014 0815   AST 22 10/28/2014 0815   ALT 22 10/28/2014 0815   BILITOT 0.57 10/28/2014 0815       Lab Results  Component Value Date   WBC 5.0 10/28/2014   HGB 14.7 10/28/2014   HCT 44.2 10/28/2014   MCV 86.1 10/28/2014   PLT 223 10/28/2014   NEUTROABS 3.2 10/28/2014    ASSESSMENT & PLAN:  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 N0 M0 stage IA ER/PR positive HER-2 negative status post lumpectomy 12/20/2011 and radiation currently on antiestrogen therapy with Aromasin 04/05/2012  Aromasin toxicities: 1. Occasional hot flashes 2. Mood swings: Much improved since last year 3. Mild myalgias Our plan is to discontinue Aromasin after 5 years  Breast cancer  surveillance: 1. Breast exam 11/25/2016 is normal 2. Mammogram 11/12/2016 is normal category B density  Patient has joined the weight loss program and is attempting to lose some weight. Patient wishes to follow with her primary care physician for surveillance checks and follow-ups. We will see her on an as-needed basis.   I spent 25 minutes talking to the patient of which more than half was spent in counseling and coordination of care.  No orders of the defined types were placed in this encounter.  The patient has a good understanding of the overall plan. she agrees with it. she will call with any problems that may develop before the next visit here.   Rulon Eisenmenger, MD 11/26/16

## 2016-12-02 ENCOUNTER — Telehealth: Payer: Self-pay | Admitting: *Deleted

## 2016-12-02 NOTE — Telephone Encounter (Signed)
I'm returning your call.  How can I help you?  "I have already handled it.  I had all this paperwork.  I was told that I didn't have to fill it out.  Thanks for calling me back."

## 2016-12-02 NOTE — Telephone Encounter (Signed)
"  Would you please return my call?  Thank you."

## 2016-12-07 ENCOUNTER — Encounter: Payer: Self-pay | Admitting: Podiatry

## 2016-12-07 DIAGNOSIS — M2020 Hallux rigidus, unspecified foot: Secondary | ICD-10-CM | POA: Diagnosis not present

## 2016-12-07 DIAGNOSIS — M2040 Other hammer toe(s) (acquired), unspecified foot: Secondary | ICD-10-CM | POA: Diagnosis not present

## 2016-12-07 DIAGNOSIS — M2042 Other hammer toe(s) (acquired), left foot: Secondary | ICD-10-CM | POA: Diagnosis not present

## 2016-12-07 DIAGNOSIS — E7800 Pure hypercholesterolemia, unspecified: Secondary | ICD-10-CM | POA: Diagnosis not present

## 2016-12-07 DIAGNOSIS — M2022 Hallux rigidus, left foot: Secondary | ICD-10-CM | POA: Diagnosis not present

## 2016-12-07 DIAGNOSIS — M21612 Bunion of left foot: Secondary | ICD-10-CM | POA: Diagnosis not present

## 2016-12-09 ENCOUNTER — Ambulatory Visit: Payer: BLUE CROSS/BLUE SHIELD | Admitting: Cardiovascular Disease

## 2016-12-09 ENCOUNTER — Telehealth: Payer: Self-pay | Admitting: *Deleted

## 2016-12-09 NOTE — Telephone Encounter (Signed)
Pt states she has questions about the pain medication. I spoke with pt and she states she gets dizzy on the pain medication, can she just take the 500mg  Tylenol. I told pt to take the tylenol as the package directs if she is tolerating well, but do not take with the pain medication that contains tylenol. I told pt not to be up on her foot more than 15 minutes per hour and to use crutches to balance. Pt states understanding.

## 2016-12-15 ENCOUNTER — Ambulatory Visit (INDEPENDENT_AMBULATORY_CARE_PROVIDER_SITE_OTHER): Payer: BLUE CROSS/BLUE SHIELD

## 2016-12-15 ENCOUNTER — Ambulatory Visit (INDEPENDENT_AMBULATORY_CARE_PROVIDER_SITE_OTHER): Payer: BLUE CROSS/BLUE SHIELD | Admitting: Podiatry

## 2016-12-15 ENCOUNTER — Encounter: Payer: Self-pay | Admitting: Podiatry

## 2016-12-15 DIAGNOSIS — M2010 Hallux valgus (acquired), unspecified foot: Secondary | ICD-10-CM | POA: Diagnosis not present

## 2016-12-15 DIAGNOSIS — M2020 Hallux rigidus, unspecified foot: Secondary | ICD-10-CM | POA: Diagnosis not present

## 2016-12-15 DIAGNOSIS — M2042 Other hammer toe(s) (acquired), left foot: Secondary | ICD-10-CM | POA: Diagnosis not present

## 2016-12-15 NOTE — Progress Notes (Signed)
Subjective:    Patient ID: Anthoney Harada, female   DOB: 66 y.o.   MRN: [id number]   HPI patient presents stating she's doing fine with mild discomfort if she's been on her foot to long    ROS      Objective:  Physical Exam neurovascular status intact negative Homan sign was noted with patient found to have well-healing surgical site left first metatarsal with third digit in good alignment with wound edges well coapted. Range of motion adequate first MPJ with no crepitus of the joint noted     Assessment:    Doing well post osteotomy first metatarsal left hammertoe repair third left     Plan:    H&P condition reviewed and discussed continued elevation compression immobilization with gradual range of motion exercises to the big toe joint and reappoint 2 weeks for suture removal and to be rechecked with probable BioSkin brace to be applied  X-rays indicate the osteotomy is healing well with pins in place joint congruence and digit in good alignment

## 2016-12-17 NOTE — Progress Notes (Signed)
DOS 05.22.2018 Bi-Planar osteotomy with fixation 1st left, hammertoe repair with removal bone distal 3rd left.

## 2016-12-24 ENCOUNTER — Ambulatory Visit (INDEPENDENT_AMBULATORY_CARE_PROVIDER_SITE_OTHER): Payer: BLUE CROSS/BLUE SHIELD

## 2016-12-24 ENCOUNTER — Ambulatory Visit (INDEPENDENT_AMBULATORY_CARE_PROVIDER_SITE_OTHER): Payer: BLUE CROSS/BLUE SHIELD | Admitting: Podiatry

## 2016-12-24 DIAGNOSIS — M2042 Other hammer toe(s) (acquired), left foot: Secondary | ICD-10-CM

## 2016-12-24 DIAGNOSIS — M2010 Hallux valgus (acquired), unspecified foot: Secondary | ICD-10-CM | POA: Diagnosis not present

## 2016-12-24 DIAGNOSIS — Z8639 Personal history of other endocrine, nutritional and metabolic disease: Secondary | ICD-10-CM | POA: Insufficient documentation

## 2016-12-24 DIAGNOSIS — M2020 Hallux rigidus, unspecified foot: Secondary | ICD-10-CM | POA: Diagnosis not present

## 2016-12-24 DIAGNOSIS — Z85828 Personal history of other malignant neoplasm of skin: Secondary | ICD-10-CM | POA: Insufficient documentation

## 2016-12-24 NOTE — Progress Notes (Signed)
Subjective:    Patient ID: Elizabeth Berg, female   DOB: 66 y.o.   MRN: [id number]   HPI patient states doing well after surgery    ROS      Objective:  Physical Exam neurovascular status intact with mild range of motion loss first MPJ left and patient getting she's not been bending the toe joint and thought she was not supposed to walk on it after surgery     Assessment:    Doing well post osteotomy first metatarsal left with patient not stressing the joint as I had indicated     Plan:    X-rays reviewed and it discussed the importance of bending the big toe joint and showed her how to do this and also to utilize her body weight to bend the toe joint with achiness to be expected. Patient be checked back 4 weeks or earlier if needed  X-rays indicate osteotomy is healing well with no signs of pathology

## 2016-12-29 ENCOUNTER — Encounter: Payer: BLUE CROSS/BLUE SHIELD | Admitting: Podiatry

## 2017-01-04 ENCOUNTER — Ambulatory Visit (INDEPENDENT_AMBULATORY_CARE_PROVIDER_SITE_OTHER): Payer: BLUE CROSS/BLUE SHIELD | Admitting: Cardiology

## 2017-01-04 ENCOUNTER — Encounter: Payer: Self-pay | Admitting: Cardiology

## 2017-01-04 VITALS — BP 124/76 | HR 72 | Ht 64.0 in | Wt 212.0 lb

## 2017-01-04 DIAGNOSIS — R002 Palpitations: Secondary | ICD-10-CM | POA: Diagnosis not present

## 2017-01-04 DIAGNOSIS — C50311 Malignant neoplasm of lower-inner quadrant of right female breast: Secondary | ICD-10-CM

## 2017-01-04 DIAGNOSIS — E785 Hyperlipidemia, unspecified: Secondary | ICD-10-CM | POA: Diagnosis not present

## 2017-01-04 DIAGNOSIS — I10 Essential (primary) hypertension: Secondary | ICD-10-CM | POA: Diagnosis not present

## 2017-01-04 NOTE — Progress Notes (Signed)
01/04/2017 Elizabeth Berg   03/13/1951  [id number]  Primary Physician Elizabeth Sheller, MD Primary Cardiologist: Dr Elizabeth Berg  HPI:  66 y/o obese Caucasian female seen by [name] in the past, then followed by Dr Elizabeth Berg for palpitations. Her husband is also a pt of Dr [employer]. She has been on Verapamil for years.Once the stress was removed she improved. An echo was done in Dec 2017 and this showed normal LVF. She was to f/u with Dr Elizabeth Berg but couldn't get an appointment and is seeing me today. She has been doing well, no tachycardia or increased palpitations.    Current Outpatient Prescriptions  Medication Sig Dispense Refill  . atorvastatin (LIPITOR) 20 MG tablet atorvastatin 20 mg tablet    . exemestane (AROMASIN) 25 MG tablet exemestane 25 mg tablet  TAKE 1 TABLET (25 MG TOTAL) BY MOUTH DAILY AFTER BREAKFAST.    Marland Kitchen levothyroxine (SYNTHROID, LEVOTHROID) 137 MCG tablet Take 137 mcg by mouth daily.    Marland Kitchen lisinopril (PRINIVIL,ZESTRIL) 10 MG tablet lisinopril 10 mg tablet    . metoprolol tartrate (LOPRESSOR) 25 MG tablet Take 0.5 tablets (12.5 mg total) by mouth 2 (two) times daily. (Patient taking differently: Take 12.5 mg by mouth as needed. ) 180 tablet 3  . metroNIDAZOLE (METROGEL) 1 % gel metronidazole 1 % topical gel    . omeprazole (PRILOSEC) 40 MG capsule omeprazole 20 mg capsule,delayed release    . ondansetron (ZOFRAN) 4 MG tablet Take 4 mg by mouth every 8 (eight) hours as needed for nausea or vomiting.    Marland Kitchen oxyCODONE-acetaminophen (PERCOCET) 10-325 MG tablet Take 1 tablet by mouth every 4 (four) hours as needed for pain.    . rosuvastatin (CRESTOR) 10 MG tablet Take 10 mg by mouth daily.    . verapamil (VERELAN PM) 120 MG 24 hr capsule verapamil ER 120 mg 24 hr capsule,extended release     No current facility-administered medications for this visit.     Allergies  Allergen Reactions  . Penicillins     Childhood allergy; reaction unknown    Past Medical  History:  Diagnosis Date  . 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 GERD (gastroesophageal reflux disease)   . H/O bone density study   . H/O colonoscopy 04/14/09  . Heart burn   . Hypercholesterolemia   . Hypertension   . Palpitations   . Personal history of radiation therapy    right breast 2013  . Wears glasses     Social History   Social History  . Marital status: Married    Spouse name: N/A  . Number of children: N/A  . Years of education: N/A   Occupational History  . Not on file.   Social History Main Topics  . Smoking status: Never Smoker  . Smokeless tobacco: Never Used  . Alcohol use No  . Drug use: No  . Sexual activity: Yes   Other Topics Concern  . Not on file   Social History Narrative  . No narrative on file     Family History  Problem Relation Age of Onset  . Esophageal cancer Mother   . Cancer Mother        esophageal  . Rectal cancer Brother   . Cancer Brother 70       rectal     Review of Systems: General: negative for chills, fever, night sweats or weight changes.  Cardiovascular: negative for chest pain, dyspnea  on exertion, edema, orthopnea, palpitations, paroxysmal nocturnal dyspnea or shortness of breath Dermatological: negative for rash Respiratory: negative for cough or wheezing Urologic: negative for hematuria Abdominal: negative for nausea, vomiting, diarrhea, bright red blood per rectum, melena, or hematemesis Neurologic: negative for visual changes, syncope, or dizziness All other systems reviewed and are otherwise negative except as noted above.    Blood pressure 124/76, pulse 72, height 5\' 4"  (1.626 m), weight 212 lb (96.2 kg), last menstrual period 12/20/2006.  General appearance: alert, cooperative, no distress and moderately obese Neck: no carotid bruit and no JVD Lungs: clear to auscultation bilaterally Heart: regular rate and rhythm Extremities: extremities normal, atraumatic, no  cyanosis or edema Skin: Skin color, texture, turgor normal. No rashes or lesions Neurologic: Grossly normal  EKG NSR  ASSESSMENT AND PLAN:   Primary cancer of lower-inner quadrant of right female breast Lumpectomy 2013 , radiation, and chemotherapy  Palpitations Chronic intermittent problem, currently under control  Essential hypertension Controlled  Dyslipidemia On statin Rx followed by PCP   PLAN  F/U one year  Kerin Ransom PA-C 01/04/2017 4:07 PM

## 2017-01-04 NOTE — Patient Instructions (Signed)
Medication Instructions:   No changes  Labwork:   none  Testing/Procedures:  none  Follow-Up:  1 year with Dr. Sallyanne Kuster  If you need a refill on your cardiac medications before your next appointment, please call your pharmacy.

## 2017-01-04 NOTE — Assessment & Plan Note (Addendum)
Chronic intermittent problem, currently under control

## 2017-01-04 NOTE — Assessment & Plan Note (Signed)
Controlled.  

## 2017-01-04 NOTE — Assessment & Plan Note (Signed)
Lumpectomy 2013 , radiation, and chemotherapy

## 2017-01-04 NOTE — Assessment & Plan Note (Signed)
On statin Rx- followed by PCP 

## 2017-01-21 ENCOUNTER — Ambulatory Visit (INDEPENDENT_AMBULATORY_CARE_PROVIDER_SITE_OTHER): Payer: BLUE CROSS/BLUE SHIELD

## 2017-01-21 ENCOUNTER — Ambulatory Visit (INDEPENDENT_AMBULATORY_CARE_PROVIDER_SITE_OTHER): Payer: Self-pay | Admitting: Podiatry

## 2017-01-21 DIAGNOSIS — M2010 Hallux valgus (acquired), unspecified foot: Secondary | ICD-10-CM

## 2017-01-21 DIAGNOSIS — M2042 Other hammer toe(s) (acquired), left foot: Secondary | ICD-10-CM | POA: Diagnosis not present

## 2017-01-24 NOTE — Progress Notes (Signed)
Subjective:    Patient ID: Elizabeth Berg, female   DOB: 66 y.o.   MRN: [id number]   HPI patient states she's doing real well with minimal discomfort    ROS      Objective:  Physical Exam neurovascular status intact with patient's left foot showing well-healed surgical site with wound edges well coapted good motion crepitus of the joint and good digital correction third left     Assessment:    Doing well post foot surgery left     Plan:    Final x-rays taken and explained continued elevation compression immobilization as needed and can return to normal activity for the most part at this time. Complete healing will take probably another 4-6 months  X-rays indicate osteotomy is healing well with good alignment joint congruence and no other pathology

## 2017-02-08 DIAGNOSIS — I10 Essential (primary) hypertension: Secondary | ICD-10-CM | POA: Diagnosis not present

## 2017-02-08 DIAGNOSIS — E559 Vitamin D deficiency, unspecified: Secondary | ICD-10-CM | POA: Diagnosis not present

## 2017-02-08 DIAGNOSIS — E785 Hyperlipidemia, unspecified: Secondary | ICD-10-CM | POA: Diagnosis not present

## 2017-02-08 DIAGNOSIS — E890 Postprocedural hypothyroidism: Secondary | ICD-10-CM | POA: Diagnosis not present

## 2017-02-16 DIAGNOSIS — K219 Gastro-esophageal reflux disease without esophagitis: Secondary | ICD-10-CM | POA: Diagnosis not present

## 2017-02-16 DIAGNOSIS — I10 Essential (primary) hypertension: Secondary | ICD-10-CM | POA: Diagnosis not present

## 2017-02-16 DIAGNOSIS — E785 Hyperlipidemia, unspecified: Secondary | ICD-10-CM | POA: Diagnosis not present

## 2017-03-04 ENCOUNTER — Other Ambulatory Visit: Payer: BLUE CROSS/BLUE SHIELD

## 2017-03-07 ENCOUNTER — Telehealth: Payer: Self-pay | Admitting: Podiatry

## 2017-03-07 NOTE — Telephone Encounter (Signed)
I informed pt that she would benefit from allowing Dr. Paulla Dolly to evaluate the ROM of her toe and make recommendations at that time. Pt states understanding and I transferred to schedulers.

## 2017-03-07 NOTE — Telephone Encounter (Signed)
I just do not feel like my toe is moving very well. I had surgery in May and I was just wondering if there is something else I can do or try.

## 2017-03-09 ENCOUNTER — Ambulatory Visit: Payer: BLUE CROSS/BLUE SHIELD | Admitting: Podiatry

## 2017-03-10 ENCOUNTER — Ambulatory Visit (INDEPENDENT_AMBULATORY_CARE_PROVIDER_SITE_OTHER): Payer: Self-pay | Admitting: Podiatry

## 2017-03-10 ENCOUNTER — Ambulatory Visit (INDEPENDENT_AMBULATORY_CARE_PROVIDER_SITE_OTHER): Payer: BLUE CROSS/BLUE SHIELD

## 2017-03-10 ENCOUNTER — Encounter: Payer: Self-pay | Admitting: Podiatry

## 2017-03-10 DIAGNOSIS — M2042 Other hammer toe(s) (acquired), left foot: Secondary | ICD-10-CM

## 2017-03-10 DIAGNOSIS — M2020 Hallux rigidus, unspecified foot: Secondary | ICD-10-CM

## 2017-03-11 NOTE — Progress Notes (Signed)
Subjective:    Patient ID: Anthoney Harada, female   DOB: 66 y.o.   MRN: [id number]   HPI patient presents stating that she has not been moving her toes the way she should be on her left foot and she's not sure about the motion    ROS      Objective:  Physical Exam neurovascular status was intact negative Homans sign was noted with well-healed surgical site left first metatarsal. The motion is mildly restricted with approximate 20 of dorsiflexion 20 plantarflexion there is no crepitus or pain when I put it through range of motion     Assessment:    Patient's doing reasonably well but is not doing a good job of joint mobilization     Plan:     Discussed condition and I did discuss the consideration for physical therapy at this time but she denies wanting this and wants to try on her own. I explained her how to do proper range of motion exercises and the importance of bending over her big toe joint when she walks. Patient will make a better effort to do this will be reevaluated again in 1 month  X-rays indicate that the osteotomies healing well pins are in place joint congruence and appears to be able to function well

## 2017-04-22 ENCOUNTER — Ambulatory Visit (INDEPENDENT_AMBULATORY_CARE_PROVIDER_SITE_OTHER): Payer: BLUE CROSS/BLUE SHIELD

## 2017-04-22 ENCOUNTER — Encounter: Payer: Self-pay | Admitting: Podiatry

## 2017-04-22 ENCOUNTER — Ambulatory Visit (INDEPENDENT_AMBULATORY_CARE_PROVIDER_SITE_OTHER): Payer: Self-pay | Admitting: Podiatry

## 2017-04-22 DIAGNOSIS — M2042 Other hammer toe(s) (acquired), left foot: Secondary | ICD-10-CM

## 2017-04-22 NOTE — Progress Notes (Signed)
Subjective:    Patient ID: Elizabeth Berg, female   DOB: 66 y.o.   MRN: [id number]   HPI patient states doing real well with minimal discomfort and range of motion which is improving    ROS      Objective:  Physical Exam neurovascular status intact negative Homans sign was noted with patient's left first MPJ functioning well with no pain or crepitus     Assessment:    Doing well post osteotomy first metatarsal left with still mild restriction of motion but improved     Plan:    X-ray reviewed and advised on the importance of continued range of motion exercises and to become more active and hopefully this will be the end of symptoms patient be seen back on an as-needed basis  X-rays indicate osteotomy is healing well with some indications this chronic joint damage on the lateral sort of the first MPJ

## 2017-06-17 DIAGNOSIS — F419 Anxiety disorder, unspecified: Secondary | ICD-10-CM | POA: Diagnosis not present

## 2017-06-17 DIAGNOSIS — R002 Palpitations: Secondary | ICD-10-CM | POA: Diagnosis not present

## 2017-06-22 ENCOUNTER — Telehealth: Payer: Self-pay | Admitting: Cardiovascular Disease

## 2017-06-22 NOTE — Telephone Encounter (Signed)
Returned call to patient, patient states she saw her PCP due to having episodes of increased palpitations as well as intermittent CP, shoulder tightness.  Patient thinks it may be stress related but her PCP has ordered a monitor to be placed as well as suggested having a stress test to rule out cardiac etiology.   Wondering if we can order a stress test.   Advised that it would be best to have patient come in to see APP to be assessed and testing can be ordered based on their assessment.   Patient agreed and verbalized understanding.   Message sent to scheduling to schedule with APP to further assess.

## 2017-06-22 NOTE — Telephone Encounter (Signed)
New Message     Patient is requesting an order a stress test , she states she spoke with PCp about her heart skipping beat

## 2017-06-29 ENCOUNTER — Other Ambulatory Visit: Payer: Self-pay | Admitting: Internal Medicine

## 2017-06-29 ENCOUNTER — Encounter (INDEPENDENT_AMBULATORY_CARE_PROVIDER_SITE_OTHER): Payer: BLUE CROSS/BLUE SHIELD

## 2017-06-29 DIAGNOSIS — R002 Palpitations: Secondary | ICD-10-CM

## 2017-07-06 DIAGNOSIS — D171 Benign lipomatous neoplasm of skin and subcutaneous tissue of trunk: Secondary | ICD-10-CM | POA: Diagnosis not present

## 2017-07-06 DIAGNOSIS — Z853 Personal history of malignant neoplasm of breast: Secondary | ICD-10-CM | POA: Diagnosis not present

## 2017-07-11 DIAGNOSIS — Z923 Personal history of irradiation: Secondary | ICD-10-CM | POA: Insufficient documentation

## 2017-07-11 DIAGNOSIS — R12 Heartburn: Secondary | ICD-10-CM | POA: Insufficient documentation

## 2017-07-11 DIAGNOSIS — K219 Gastro-esophageal reflux disease without esophagitis: Secondary | ICD-10-CM | POA: Insufficient documentation

## 2017-07-11 DIAGNOSIS — I10 Essential (primary) hypertension: Secondary | ICD-10-CM | POA: Insufficient documentation

## 2017-07-11 DIAGNOSIS — Z973 Presence of spectacles and contact lenses: Secondary | ICD-10-CM | POA: Insufficient documentation

## 2017-07-11 DIAGNOSIS — E7800 Pure hypercholesterolemia, unspecified: Secondary | ICD-10-CM | POA: Insufficient documentation

## 2017-07-11 DIAGNOSIS — C50919 Malignant neoplasm of unspecified site of unspecified female breast: Secondary | ICD-10-CM | POA: Insufficient documentation

## 2017-07-11 DIAGNOSIS — Z9289 Personal history of other medical treatment: Secondary | ICD-10-CM | POA: Insufficient documentation

## 2017-07-18 ENCOUNTER — Encounter: Payer: Self-pay | Admitting: Cardiology

## 2017-07-18 ENCOUNTER — Ambulatory Visit (INDEPENDENT_AMBULATORY_CARE_PROVIDER_SITE_OTHER): Payer: BLUE CROSS/BLUE SHIELD | Admitting: Cardiology

## 2017-07-18 VITALS — BP 152/87 | HR 74 | Ht 64.0 in | Wt 216.0 lb

## 2017-07-18 DIAGNOSIS — E6609 Other obesity due to excess calories: Secondary | ICD-10-CM | POA: Diagnosis not present

## 2017-07-18 DIAGNOSIS — R002 Palpitations: Secondary | ICD-10-CM | POA: Diagnosis not present

## 2017-07-18 DIAGNOSIS — R079 Chest pain, unspecified: Secondary | ICD-10-CM | POA: Diagnosis not present

## 2017-07-18 DIAGNOSIS — E785 Hyperlipidemia, unspecified: Secondary | ICD-10-CM | POA: Diagnosis not present

## 2017-07-18 DIAGNOSIS — C50919 Malignant neoplasm of unspecified site of unspecified female breast: Secondary | ICD-10-CM | POA: Diagnosis not present

## 2017-07-18 DIAGNOSIS — C50311 Malignant neoplasm of lower-inner quadrant of right female breast: Secondary | ICD-10-CM | POA: Diagnosis not present

## 2017-07-18 DIAGNOSIS — Z923 Personal history of irradiation: Secondary | ICD-10-CM

## 2017-07-18 DIAGNOSIS — I10 Essential (primary) hypertension: Secondary | ICD-10-CM

## 2017-07-18 MED ORDER — METOPROLOL TARTRATE 25 MG PO TABS: 25.0000 mg | ORAL_TABLET | Freq: Two times a day (BID) | ORAL | 1 refills | Status: DC

## 2017-07-18 NOTE — Patient Instructions (Addendum)
Medication Instructions:  INCREASE Lopressor to 12.5mg  Take 1 tablet twice a day MAY INCREASE TO 1 (ONE) WHOLE TAB IF THE HALF TAB DOES NOT HELP PALPITATIONS   Labwork: None    Testing/Procedures: Your physician has requested that you have an exercise stress myoview. For further information please visit [email or web address]. Please follow instruction sheet, as given. HOLD LOPRESSOR THE NIGHT BEFORE AND DAY OF TEST  Follow-Up: Your physician recommends that you schedule a follow-up appointment in: 3-4 months with Dr Sallyanne Kuster  Any Other Special Instructions Will Be Listed Below (If Applicable).  If you need a refill on your cardiac medications before your next appointment, please call your pharmacy.

## 2017-07-18 NOTE — Assessment & Plan Note (Signed)
Pt complains of chest tightness and Lt arm discomfort

## 2017-07-18 NOTE — Assessment & Plan Note (Signed)
PACs and short runs of PAF (less than 10 beats) documented dec 2018

## 2017-07-18 NOTE — Progress Notes (Signed)
07/18/2017 Elizabeth Berg   July 29, 1950  [id number]  Primary Physician Deland Pretty, MD Primary Cardiologist: Dr Ginnie Smart  HPI:  66 y/o female followed by Dr Sallyanne Kuster with a history of palpitations and anxiety. She had an echo in 2017 that was normal. She says she has always been "nervous". She tells me she has always had palpitations. She is in the offcie today for palpitations and Lt arm and chest discomfort. Dr Dwaine Gale had her wear a monitor. This revealed PACs and runs of 6-7 beat PAF. By history she denies any sustained tachycardia. Her symptoms correlated with her monitor. She also describes intermittent Lt arm discomfort and vague chest tightness. No associated nausea or diaphoresis. I had prescribed Metoprolol 12.5 mg PRN in the past but she says she only took this a few times and couldn't tell any difference.    Current Outpatient Medications  Medication Sig Dispense Refill  . atorvastatin (LIPITOR) 20 MG tablet atorvastatin 20 mg tablet    . levothyroxine (SYNTHROID, LEVOTHROID) 137 MCG tablet Take 137 mcg by mouth daily.    Marland Kitchen lisinopril (PRINIVIL,ZESTRIL) 10 MG tablet lisinopril 10 mg tablet    . metoprolol tartrate (LOPRESSOR) 25 MG tablet Take 1 tablet (25 mg total) by mouth 2 (two) times daily. 180 tablet 1  . metroNIDAZOLE (METROGEL) 1 % gel metronidazole 1 % topical gel    . omeprazole (PRILOSEC) 40 MG capsule omeprazole 20 mg capsule,delayed release    . rosuvastatin (CRESTOR) 10 MG tablet Take 10 mg by mouth daily.    . verapamil (VERELAN PM) 120 MG 24 hr capsule verapamil ER 120 mg 24 hr capsule,extended release     No current facility-administered medications for this visit.     Allergies  Allergen Reactions  . Penicillins     Childhood allergy; reaction unknown    Past Medical History:  Diagnosis Date  . 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 GERD (gastroesophageal reflux disease)   . H/O bone density study   . H/O  colonoscopy 04/14/09  . Heart burn   . Hypercholesterolemia   . Hypertension   . Palpitations   . Personal history of radiation therapy    right breast 2013  . Wears glasses     Social History   Socioeconomic History  . Marital status: Married    Spouse name: Not on file  . Number of children: Not on file  . Years of education: Not on file  . Highest education level: Not on file  Social Needs  . Financial resource strain: Not on file  . Food insecurity - worry: Not on file  . Food insecurity - inability: Not on file  . Transportation needs - medical: Not on file  . Transportation needs - non-medical: Not on file  Occupational History  . Not on file  Tobacco Use  . Smoking status: Never Smoker  . Smokeless tobacco: Never Used  Substance and Sexual Activity  . Alcohol use: No  . Drug use: No  . Sexual activity: Yes  Other Topics Concern  . Not on file  Social History Narrative  . Not on file     Family History  Problem Relation Age of Onset  . Esophageal cancer Mother   . Cancer Mother        esophageal  . Rectal cancer Brother   . Cancer Brother 79       rectal     Review  of Systems: General: negative for chills, fever, night sweats or weight changes.  Cardiovascular: negative for chest pain, dyspnea on exertion, edema, orthopnea, palpitations, paroxysmal nocturnal dyspnea or shortness of breath Dermatological: negative for rash Respiratory: negative for cough or wheezing Urologic: negative for hematuria Abdominal: negative for nausea, vomiting, diarrhea, bright red blood per rectum, melena, or hematemesis Neurologic: negative for visual changes, syncope, or dizziness All other systems reviewed and are otherwise negative except as noted above.    Blood pressure (!) 152/87, pulse 74, height 5\' 4"  (1.626 m), weight 216 lb (98 kg), last menstrual period 12/20/2006.  General appearance: alert, cooperative, no distress and moderately obese Neck: no carotid  bruit and no JVD Lungs: clear to auscultation bilaterally Heart: regular rate and rhythm Extremities: extremities normal, atraumatic, no cyanosis or edema Skin: Skin color, texture, turgor normal. No rashes or lesions Neurologic: Grossly normal   ASSESSMENT AND PLAN:   Chest pain with moderate risk of acute coronary syndrome Pt complains of chest tightness and Lt arm discomfort  Palpitations PACs and short runs of PAF (less than 10 beats) documented dec 2018  Primary cancer of lower-inner quadrant of right female breast Lumpectomy 2013 , radiation, and chemotherapy  Essential hypertension Controlled  Dyslipidemia On statin Rx followed by PCP   PLAN  I suggested she start taking Lopressor 12.5 mg BID to see if that helps decrease her palpitations. If not she should increase this to 25 mg BID. I will arrange an exercise Myoview (OK to hold beta blocker). I'll review her Holter with Dr Sallyanne Kuster but I don't think she needs anticoagulation for these short runs of tachycardia.   Kerin Ransom PA-C 07/18/2017 8:41 AM

## 2017-07-20 ENCOUNTER — Telehealth (HOSPITAL_COMMUNITY): Payer: Self-pay

## 2017-07-20 NOTE — Telephone Encounter (Signed)
Encounter complete. 

## 2017-07-21 ENCOUNTER — Encounter (HOSPITAL_COMMUNITY): Payer: BLUE CROSS/BLUE SHIELD

## 2017-07-22 ENCOUNTER — Ambulatory Visit (HOSPITAL_COMMUNITY)
Admission: RE | Admit: 2017-07-22 | Discharge: 2017-07-22 | Disposition: A | Discharge status: Home / Self Care | Payer: BLUE CROSS/BLUE SHIELD | Source: Ambulatory Visit | Attending: Cardiovascular Disease | Admitting: Cardiovascular Disease

## 2017-07-22 DIAGNOSIS — R002 Palpitations: Secondary | ICD-10-CM | POA: Diagnosis not present

## 2017-07-22 DIAGNOSIS — I10 Essential (primary) hypertension: Secondary | ICD-10-CM | POA: Diagnosis not present

## 2017-07-22 DIAGNOSIS — E6609 Other obesity due to excess calories: Secondary | ICD-10-CM

## 2017-07-22 DIAGNOSIS — E785 Hyperlipidemia, unspecified: Secondary | ICD-10-CM | POA: Diagnosis not present

## 2017-07-22 DIAGNOSIS — C50311 Malignant neoplasm of lower-inner quadrant of right female breast: Secondary | ICD-10-CM | POA: Diagnosis not present

## 2017-07-22 DIAGNOSIS — Z923 Personal history of irradiation: Secondary | ICD-10-CM

## 2017-07-22 DIAGNOSIS — C50919 Malignant neoplasm of unspecified site of unspecified female breast: Secondary | ICD-10-CM

## 2017-07-22 LAB — MYOCARDIAL PERFUSION IMAGING
LV dias vol: 99 mL (ref 46–106)
LV sys vol: 38 mL
Peak HR: 113 {beats}/min
Rest HR: 79 {beats}/min
SDS: 0
SRS: 1
SSS: 1
TID: 1.07

## 2017-07-22 MED ORDER — TECHNETIUM TC 99M TETROFOSMIN IV KIT
31.5000 | PACK | Freq: Once | INTRAVENOUS | Status: AC | PRN
  Administered 2017-07-22: 31.5 via INTRAVENOUS
  Filled 2017-07-22: qty 32

## 2017-07-22 MED ORDER — REGADENOSON 0.4 MG/5ML IV SOLN
0.4000 mg | Freq: Once | INTRAVENOUS | Status: AC
  Administered 2017-07-22: 0.4 mg via INTRAVENOUS

## 2017-07-22 MED ORDER — TECHNETIUM TC 99M TETROFOSMIN IV KIT
9.8000 | PACK | Freq: Once | INTRAVENOUS | Status: AC | PRN
  Administered 2017-07-22: 9.8 via INTRAVENOUS
  Filled 2017-07-22: qty 10

## 2017-07-28 ENCOUNTER — Telehealth: Payer: Self-pay | Admitting: Cardiovascular Disease

## 2017-07-28 NOTE — Telephone Encounter (Signed)
Returned call to patient myoview results given. 

## 2017-07-28 NOTE — Telephone Encounter (Signed)
New message   Patient spouse calling back, states he had patient phone. Patient will be available in 20 minutes Please call patient back with results of Myocardial Perfusion.

## 2017-08-11 DIAGNOSIS — Z124 Encounter for screening for malignant neoplasm of cervix: Secondary | ICD-10-CM | POA: Diagnosis not present

## 2017-08-11 DIAGNOSIS — E785 Hyperlipidemia, unspecified: Secondary | ICD-10-CM | POA: Insufficient documentation

## 2017-08-11 DIAGNOSIS — Z01419 Encounter for gynecological examination (general) (routine) without abnormal findings: Secondary | ICD-10-CM | POA: Diagnosis not present

## 2017-09-13 DIAGNOSIS — Z131 Encounter for screening for diabetes mellitus: Secondary | ICD-10-CM | POA: Diagnosis not present

## 2017-09-13 DIAGNOSIS — E785 Hyperlipidemia, unspecified: Secondary | ICD-10-CM | POA: Diagnosis not present

## 2017-09-13 DIAGNOSIS — E559 Vitamin D deficiency, unspecified: Secondary | ICD-10-CM | POA: Diagnosis not present

## 2017-09-13 DIAGNOSIS — I10 Essential (primary) hypertension: Secondary | ICD-10-CM | POA: Diagnosis not present

## 2017-09-13 DIAGNOSIS — Z0000 Encounter for general adult medical examination without abnormal findings: Secondary | ICD-10-CM | POA: Diagnosis not present

## 2017-09-15 ENCOUNTER — Encounter: Payer: Self-pay | Admitting: Cardiovascular Disease

## 2017-09-15 DIAGNOSIS — R748 Abnormal levels of other serum enzymes: Secondary | ICD-10-CM | POA: Diagnosis not present

## 2017-09-21 DIAGNOSIS — I788 Other diseases of capillaries: Secondary | ICD-10-CM | POA: Diagnosis not present

## 2017-09-21 DIAGNOSIS — R945 Abnormal results of liver function studies: Secondary | ICD-10-CM | POA: Diagnosis not present

## 2017-09-21 DIAGNOSIS — I10 Essential (primary) hypertension: Secondary | ICD-10-CM | POA: Diagnosis not present

## 2017-09-21 DIAGNOSIS — Z0000 Encounter for general adult medical examination without abnormal findings: Secondary | ICD-10-CM | POA: Diagnosis not present

## 2017-09-21 DIAGNOSIS — E7800 Pure hypercholesterolemia, unspecified: Secondary | ICD-10-CM | POA: Diagnosis not present

## 2017-09-21 DIAGNOSIS — R7989 Other specified abnormal findings of blood chemistry: Secondary | ICD-10-CM | POA: Diagnosis not present

## 2017-09-22 DIAGNOSIS — R945 Abnormal results of liver function studies: Secondary | ICD-10-CM | POA: Diagnosis not present

## 2017-09-22 DIAGNOSIS — K760 Fatty (change of) liver, not elsewhere classified: Secondary | ICD-10-CM | POA: Diagnosis not present

## 2017-09-22 DIAGNOSIS — N1330 Unspecified hydronephrosis: Secondary | ICD-10-CM | POA: Diagnosis not present

## 2017-09-29 ENCOUNTER — Other Ambulatory Visit: Payer: Self-pay | Admitting: Hematology and Oncology

## 2017-09-29 DIAGNOSIS — Z853 Personal history of malignant neoplasm of breast: Secondary | ICD-10-CM

## 2017-10-19 DIAGNOSIS — N1339 Other hydronephrosis: Secondary | ICD-10-CM | POA: Diagnosis not present

## 2017-10-25 DIAGNOSIS — Z85828 Personal history of other malignant neoplasm of skin: Secondary | ICD-10-CM | POA: Diagnosis not present

## 2017-10-25 DIAGNOSIS — I700 Atherosclerosis of aorta: Secondary | ICD-10-CM | POA: Diagnosis not present

## 2017-10-25 DIAGNOSIS — D2262 Melanocytic nevi of left upper limb, including shoulder: Secondary | ICD-10-CM | POA: Diagnosis not present

## 2017-10-25 DIAGNOSIS — L570 Actinic keratosis: Secondary | ICD-10-CM | POA: Diagnosis not present

## 2017-10-25 DIAGNOSIS — L565 Disseminated superficial actinic porokeratosis (DSAP): Secondary | ICD-10-CM | POA: Diagnosis not present

## 2017-10-25 DIAGNOSIS — N1339 Other hydronephrosis: Secondary | ICD-10-CM | POA: Diagnosis not present

## 2017-10-25 DIAGNOSIS — D2261 Melanocytic nevi of right upper limb, including shoulder: Secondary | ICD-10-CM | POA: Diagnosis not present

## 2017-10-27 ENCOUNTER — Ambulatory Visit: Payer: BLUE CROSS/BLUE SHIELD | Admitting: Cardiovascular Disease

## 2017-11-01 ENCOUNTER — Encounter: Payer: Self-pay | Admitting: Cardiovascular Disease

## 2017-11-01 ENCOUNTER — Ambulatory Visit (INDEPENDENT_AMBULATORY_CARE_PROVIDER_SITE_OTHER): Payer: BLUE CROSS/BLUE SHIELD | Admitting: Cardiovascular Disease

## 2017-11-01 VITALS — BP 126/72 | HR 65 | Ht 64.0 in | Wt 208.0 lb

## 2017-11-01 DIAGNOSIS — R002 Palpitations: Secondary | ICD-10-CM

## 2017-11-01 DIAGNOSIS — I872 Venous insufficiency (chronic) (peripheral): Secondary | ICD-10-CM | POA: Diagnosis not present

## 2017-11-01 DIAGNOSIS — R9431 Abnormal electrocardiogram [ECG] [EKG]: Secondary | ICD-10-CM | POA: Diagnosis not present

## 2017-11-01 DIAGNOSIS — E7800 Pure hypercholesterolemia, unspecified: Secondary | ICD-10-CM | POA: Diagnosis not present

## 2017-11-01 NOTE — Progress Notes (Signed)
Patient ID: Elizabeth Berg, female   DOB: 1951/01/16, 67 y.o.   MRN: [id number]     Cardiology Office Note   Date:  11/01/2017   ID:  Elizabeth Berg, DOB January 06, 1951, MRN [id number]  PCP:  Deland Pretty, MD  Cardiologist:   Sanda Klein, MD   Chief Complaint  Patient presents with  . Venous Insufficiency      History of Present Illness: Elizabeth Berg is a 67 y.o. female who presents for palpitations and peripheral venous insufficiency.  Has long-standing problems with varicose veins and has undergone venous ablation in the past.  She continues to be very much distressed about the discoloration and hyperpigmentation in her legs.  She has never had skin ulcers or other serious complications of venous insufficiency and only has very mild edema intermittently.  She is worried about the potential long-term complications, but is also very troubled by the cosmetics of her disorder.  She does not have intermittent claudication or leg pain at rest.  She is worried about the complications of vascular disorders and potential limb loss.  I think she was confused about the implications of venous insufficiency versus the much more serious possibility of arterial insufficiency.  We discussed this in a lot of detail today.  She believes that worrying over her leg problems is the reason why she is having increasing frequency of palpitations.  She had an excellent response to addition of metoprolol to her long-standing treatment with verapamil.  She has found that her palpitations are essentially abolished even if she only takes the metoprolol once daily in the morning.  She had a normal nuclear stress test on July 22, 2017.  The patient specifically denies any chest pain at rest exertion, dyspnea at rest or with exertion, orthopnea, paroxysmal nocturnal dyspnea, syncope, palpitations, focal neurological deficits, intermittent claudication, lower extremity edema, unexplained weight gain,  cough, hemoptysis or wheezing.    Past Medical History:  Diagnosis Date  . 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 GERD (gastroesophageal reflux disease)   . H/O bone density study   . H/O colonoscopy 04/14/09  . Heart burn   . Hypercholesterolemia   . Hypertension   . Palpitations   . Personal history of radiation therapy    right breast 2013  . Wears glasses     Past Surgical History:  Procedure Laterality Date  . bone density study    . BREAST BIOPSY  11/22/2011   right breast biopsy  . BREAST LUMPECTOMY  12/20/2011   right breast lumpectomy and right axillary lymph node biopsy  . CHOLECYSTECTOMY    . COLONOSCOPY    . THYROID SURGERY    . TOTAL THYROIDECTOMY  2008   Dr. Harlow Asa     Current Outpatient Medications  Medication Sig Dispense Refill  . atorvastatin (LIPITOR) 20 MG tablet atorvastatin 20 mg tablet    . levothyroxine (SYNTHROID, LEVOTHROID) 137 MCG tablet Take 137 mcg by mouth daily.    Marland Kitchen lisinopril (PRINIVIL,ZESTRIL) 10 MG tablet lisinopril 10 mg tablet    . metoprolol tartrate (LOPRESSOR) 25 MG tablet Take 1 tablet (25 mg total) by mouth 2 (two) times daily. 180 tablet 1  . metroNIDAZOLE (METROGEL) 1 % gel metronidazole 1 % topical gel    . omeprazole (PRILOSEC) 40 MG capsule omeprazole 20 mg capsule,delayed release    . rosuvastatin (CRESTOR) 10 MG tablet Take 10 mg by mouth daily.    . verapamil (  VERELAN PM) 120 MG 24 hr capsule verapamil ER 120 mg 24 hr capsule,extended release     No current facility-administered medications for this visit.     Allergies:   Penicillins    Social History:  The patient  reports that she has never smoked. She has never used smokeless tobacco. She reports that she does not drink alcohol or use drugs.   Family History:  The patient's family history includes Cancer in her mother; Cancer (age of onset: 14) in her brother; Esophageal cancer in her mother; Rectal cancer in her brother.    ROS:   Please see the history of present illness.    Otherwise, review of systems positive for none.   All other systems are reviewed and negative.    PHYSICAL EXAM: VS:  BP 126/72   Pulse 65   Ht 5\' 4"  (1.626 m)   Wt 208 lb (94.3 kg)   LMP 12/20/2006   BMI 35.70 kg/m  , BMI Body mass index is 35.7 kg/m.   General: Alert, oriented x3, no distress, obese Head: no evidence of trauma, PERRL, EOMI, no exophtalmos or lid lag, no myxedema, no xanthelasma; normal ears, nose and oropharynx Neck: normal jugular venous pulsations and no hepatojugular reflux; brisk carotid pulses without delay and no carotid bruits Chest: clear to auscultation, no signs of consolidation by percussion or palpation, normal fremitus, symmetrical and full respiratory excursions Cardiovascular: normal position and quality of the apical impulse, regular rhythm, normal first and second heart sounds, no murmurs, rubs or gallops Abdomen: no tenderness or distention, no masses by palpation, no abnormal pulsatility or arterial bruits, normal bowel sounds, no hepatosplenomegaly Extremities: no clubbing, cyanosis or edema; she does have hyperpigmentation and some hemosiderin deposits along the lower half of her shins bilaterally as well as some spider veins, but no large varicose veins and no trophic skin ulcers; 2+ radial, ulnar and brachial pulses bilaterally; 2+ right femoral, posterior tibial and dorsalis pedis pulses; 2+ left femoral, posterior tibial and dorsalis pedis pulses; no subclavian or femoral bruits Neurological: grossly nonfocal Psych: Normal mood and affect   EKG:  EKG is ordered today. The ekg ordered today demonstrates sinus rhythm, mild T wave inversion in V1-V3   Recent Labs: September 13, 2017.  Creatinine 0.9, potassium 4.3, hemoglobin A1c 6%, normal liver function tests   Lipid Panel September 13, 2017 Cholesterol 163, triglycerides 109, HDL 47, LDL 94  Wt Readings from Last 3 Encounters:  11/01/17  208 lb (94.3 kg)  07/22/17 216 lb (98 kg)  07/18/17 216 lb (98 kg)   .   ASSESSMENT AND PLAN:  1. Palpitations   2. Peripheral venous insufficiency   3. Hypercholesterolemia   4. Abnormal ECG      1.  Palpitations: Excellent response to very low-dose beta-blocker.  We discussed the fact that these are adrenergically driven.  In the absence of significant structural heart disease they are not of great concern.   2. Peripheral venous insufficiency.  It seems to me that she worries excessively about the peripheral venous insufficiency.  I reassured her that her degree of problems is unlikely to cause severe future health concerns.  Clearly the cosmetics of the condition are also a big stressor to her.  We discussed the need to keep her legs elevated as much as possible, wearing compression stockings when she is sitting or standing for prolonged periods of time, keeping an eye out for any skin ulceration.  Weight loss would be beneficial. 2.  Dyslipidemia with all parameters at target, statin. 3.  Abnormal ECG: Similar repolarization abnormalities have been seen intermittently on ECGs going back many years.  They are very slightly more prominent on today's tracing.  She just had a normal nuclear stress test in January and does not have any complaints of angina pectoris.  I do not think additional evaluation for coronary disease is necessary.    Current medicines are reviewed at length with the patient today.  The patient does not have concerns regarding medicines.  The following changes have been made:  no change  Labs/ tests ordered today include:   Orders Placed This Encounter  Procedures  . EKG 12-Lead     Patient Instructions  Dr Sallyanne Kuster recommends that you schedule a follow-up appointment in 12 months. You will receive a reminder letter in the mail two months in advance. If you don't receive a letter, please call our office to schedule the follow-up appointment.  If you need a  refill on your cardiac medications before your next appointment, please call your pharmacy.     Signed, Sanda Klein, MD  11/01/2017 3:28 PM    Sanda Klein, MD, [facility] [phone] office [phone] pager

## 2017-11-01 NOTE — Patient Instructions (Signed)
Dr Croitoru recommends that you schedule a follow-up appointment in 12 months. You will receive a reminder letter in the mail two months in advance. If you don't receive a letter, please call our office to schedule the follow-up appointment.  If you need a refill on your cardiac medications before your next appointment, please call your pharmacy. 

## 2017-11-15 ENCOUNTER — Ambulatory Visit
Admission: RE | Admit: 2017-11-15 | Discharge: 2017-11-15 | Disposition: A | Discharge status: Home / Self Care | Payer: BLUE CROSS/BLUE SHIELD | Source: Ambulatory Visit | Attending: Hematology and Oncology | Admitting: Hematology and Oncology

## 2017-11-15 DIAGNOSIS — Z853 Personal history of malignant neoplasm of breast: Secondary | ICD-10-CM

## 2017-11-15 DIAGNOSIS — R928 Other abnormal and inconclusive findings on diagnostic imaging of breast: Secondary | ICD-10-CM | POA: Diagnosis not present

## 2017-11-17 DIAGNOSIS — F419 Anxiety disorder, unspecified: Secondary | ICD-10-CM | POA: Diagnosis not present

## 2017-11-17 DIAGNOSIS — I872 Venous insufficiency (chronic) (peripheral): Secondary | ICD-10-CM | POA: Diagnosis not present

## 2017-11-22 DIAGNOSIS — E890 Postprocedural hypothyroidism: Secondary | ICD-10-CM | POA: Diagnosis not present

## 2017-11-24 DIAGNOSIS — E890 Postprocedural hypothyroidism: Secondary | ICD-10-CM | POA: Diagnosis not present

## 2017-11-25 ENCOUNTER — Telehealth: Payer: Self-pay | Admitting: Hematology and Oncology

## 2017-11-25 NOTE — Telephone Encounter (Signed)
Patient called to cancel °

## 2017-11-28 ENCOUNTER — Inpatient Hospital Stay: Payer: BLUE CROSS/BLUE SHIELD | Admitting: Hematology and Oncology

## 2017-12-01 DIAGNOSIS — F419 Anxiety disorder, unspecified: Secondary | ICD-10-CM | POA: Diagnosis not present

## 2017-12-01 DIAGNOSIS — R202 Paresthesia of skin: Secondary | ICD-10-CM | POA: Diagnosis not present

## 2017-12-01 DIAGNOSIS — E559 Vitamin D deficiency, unspecified: Secondary | ICD-10-CM | POA: Diagnosis not present

## 2018-01-23 DIAGNOSIS — E890 Postprocedural hypothyroidism: Secondary | ICD-10-CM | POA: Diagnosis not present

## 2018-03-17 DIAGNOSIS — E7800 Pure hypercholesterolemia, unspecified: Secondary | ICD-10-CM | POA: Diagnosis not present

## 2018-03-17 DIAGNOSIS — E039 Hypothyroidism, unspecified: Secondary | ICD-10-CM | POA: Diagnosis not present

## 2018-03-17 DIAGNOSIS — R7303 Prediabetes: Secondary | ICD-10-CM | POA: Diagnosis not present

## 2018-03-24 DIAGNOSIS — E7800 Pure hypercholesterolemia, unspecified: Secondary | ICD-10-CM | POA: Diagnosis not present

## 2018-03-24 DIAGNOSIS — I10 Essential (primary) hypertension: Secondary | ICD-10-CM | POA: Diagnosis not present

## 2018-07-06 DIAGNOSIS — D171 Benign lipomatous neoplasm of skin and subcutaneous tissue of trunk: Secondary | ICD-10-CM | POA: Diagnosis not present

## 2018-07-06 DIAGNOSIS — Z853 Personal history of malignant neoplasm of breast: Secondary | ICD-10-CM | POA: Diagnosis not present

## 2018-08-28 DIAGNOSIS — E890 Postprocedural hypothyroidism: Secondary | ICD-10-CM | POA: Diagnosis not present

## 2018-09-12 DIAGNOSIS — Z124 Encounter for screening for malignant neoplasm of cervix: Secondary | ICD-10-CM | POA: Diagnosis not present

## 2018-09-12 DIAGNOSIS — Z01419 Encounter for gynecological examination (general) (routine) without abnormal findings: Secondary | ICD-10-CM | POA: Diagnosis not present

## 2018-09-12 DIAGNOSIS — Z6837 Body mass index (BMI) 37.0-37.9, adult: Secondary | ICD-10-CM | POA: Diagnosis not present

## 2018-09-26 DIAGNOSIS — R739 Hyperglycemia, unspecified: Secondary | ICD-10-CM | POA: Diagnosis not present

## 2018-09-26 DIAGNOSIS — E785 Hyperlipidemia, unspecified: Secondary | ICD-10-CM | POA: Diagnosis not present

## 2018-09-26 DIAGNOSIS — E559 Vitamin D deficiency, unspecified: Secondary | ICD-10-CM | POA: Diagnosis not present

## 2018-09-26 DIAGNOSIS — I10 Essential (primary) hypertension: Secondary | ICD-10-CM | POA: Diagnosis not present

## 2018-10-05 ENCOUNTER — Other Ambulatory Visit: Payer: Self-pay | Admitting: Obstetrics and Gynecology

## 2018-10-05 DIAGNOSIS — Z1231 Encounter for screening mammogram for malignant neoplasm of breast: Secondary | ICD-10-CM

## 2018-11-01 ENCOUNTER — Other Ambulatory Visit: Payer: Self-pay | Admitting: Obstetrics and Gynecology

## 2018-11-01 DIAGNOSIS — N644 Mastodynia: Secondary | ICD-10-CM

## 2018-11-02 ENCOUNTER — Telehealth: Payer: Self-pay | Admitting: Cardiovascular Disease

## 2018-11-02 NOTE — Telephone Encounter (Signed)
Called pt but she was working so she will call back to schedule.

## 2018-11-03 ENCOUNTER — Telehealth: Payer: Self-pay | Admitting: Cardiovascular Disease

## 2018-11-03 NOTE — Telephone Encounter (Signed)
Noted. Thanks.

## 2018-11-03 NOTE — Telephone Encounter (Signed)
Patient called, stated that the other day she "felt funny in her chest", she does not have that feeling now, but when she did she checked her BP and it was up, but she states she had worked herself up, and knew it would be elevated. She took an EKG on her husbands apple watch, and states "it looks strange" and wanted it to be looked at. I advised patient to send it as a message through [facility], and Dr.Croitoru would look at it when able. Patient does have appointment 04/21.  Will monitor for message to come through on mychart.

## 2018-11-03 NOTE — Telephone Encounter (Signed)
New Message  Patient would like a nurse to call her about virtual appointment that has been scheduled.

## 2018-11-03 NOTE — Telephone Encounter (Signed)
Pt called to let us know he is trying to upload the images via MyChart but was having difficulties. I gave him the Help Desk number for MyChart, and he said he will contact them. The images are on their way!

## 2018-11-06 ENCOUNTER — Ambulatory Visit
Admission: RE | Admit: 2018-11-06 | Discharge: 2018-11-06 | Disposition: A | Discharge status: Home / Self Care | Payer: BLUE CROSS/BLUE SHIELD | Source: Ambulatory Visit | Attending: Obstetrics and Gynecology | Admitting: Obstetrics and Gynecology

## 2018-11-06 ENCOUNTER — Other Ambulatory Visit: Payer: Self-pay

## 2018-11-06 ENCOUNTER — Ambulatory Visit: Payer: BLUE CROSS/BLUE SHIELD

## 2018-11-06 ENCOUNTER — Telehealth: Payer: Self-pay | Admitting: Cardiovascular Disease

## 2018-11-06 DIAGNOSIS — N644 Mastodynia: Secondary | ICD-10-CM

## 2018-11-06 NOTE — Telephone Encounter (Signed)
Mychart, smartphone, pre reg complete 11/06/18 AF

## 2018-11-07 ENCOUNTER — Encounter: Payer: Self-pay | Admitting: *Deleted

## 2018-11-07 ENCOUNTER — Telehealth: Payer: Self-pay | Admitting: *Deleted

## 2018-11-07 ENCOUNTER — Telehealth (INDEPENDENT_AMBULATORY_CARE_PROVIDER_SITE_OTHER): Payer: BLUE CROSS/BLUE SHIELD | Admitting: Cardiovascular Disease

## 2018-11-07 DIAGNOSIS — I10 Essential (primary) hypertension: Secondary | ICD-10-CM

## 2018-11-07 DIAGNOSIS — R002 Palpitations: Secondary | ICD-10-CM | POA: Diagnosis not present

## 2018-11-07 DIAGNOSIS — E785 Hyperlipidemia, unspecified: Secondary | ICD-10-CM

## 2018-11-07 DIAGNOSIS — R7303 Prediabetes: Secondary | ICD-10-CM

## 2018-11-07 DIAGNOSIS — Z6836 Body mass index (BMI) 36.0-36.9, adult: Secondary | ICD-10-CM

## 2018-11-07 DIAGNOSIS — Z0000 Encounter for general adult medical examination without abnormal findings: Secondary | ICD-10-CM | POA: Diagnosis not present

## 2018-11-07 DIAGNOSIS — E7800 Pure hypercholesterolemia, unspecified: Secondary | ICD-10-CM | POA: Diagnosis not present

## 2018-11-07 MED ORDER — METOPROLOL SUCCINATE ER 25 MG PO TB24: ORAL_TABLET | ORAL | 3 refills | Status: DC

## 2018-11-07 NOTE — Telephone Encounter (Signed)
Cardiac Questionnaire:    Since your last visit or hospitalization:    1. Have you been having new or worsening chest pain? YES   2. Have you been having new or worsening shortness of breath? NO 3. Have you been having new or worsening leg swelling, wt gain, or increase in abdominal girth (pants fitting more tightly)? NO   4. Have you had any passing out spells? NO    *A YES to any of these questions would result in the appointment being kept. *If all the answers to these questions are NO, we should indicate that given the current situation regarding the worldwide coronarvirus pandemic, at the recommendation of the CDC, we are looking to limit gatherings in our waiting area, and thus will reschedule their appointment beyond four weeks from today.   _____________   [id number] Pre-Screening Questions:  . Do you currently have a fever? NO . Have you recently travelled on a cruise, internationally, or to [place], [place], [place], [place], [place], or [place], [place] [employer])? NO . Have you been in contact with someone that is currently pending confirmation of Covid19 testing or has been confirmed to have the [place] virus? NO . Are you currently experiencing fatigue or cough? NO           Virtual Visit Pre-Appointment Phone Call  Steps For Call:  1. Confirm consent - "In the setting of the current Covid19 crisis, you are scheduled for a (phone or video) visit with your provider on (date) at (time).  Just as we do with many in-office visits, in order for you to participate in this visit, we must obtain consent.  If you'd like, I can send this to your mychart (if signed up) or email for you to review.  Otherwise, I can obtain your verbal consent now.  All virtual visits are billed to your insurance company just like a normal visit would be.  By agreeing to a virtual visit, we'd like you to understand that the technology does not allow for your provider to perform an examination, and thus may limit your  provider's ability to fully assess your condition. If your provider identifies any concerns that need to be evaluated in person, we will make arrangements to do so.  Finally, though the technology is pretty good, we cannot assure that it will always work on either your or our end, and in the setting of a video visit, we may have to convert it to a phone-only visit.  In either situation, we cannot ensure that we have a secure connection.  Are you willing to proceed?" STAFF: Did the patient verbally acknowledge consent to telehealth visit? Document YES/NO here: YES  2. Confirm the BEST phone number to call the day of the visit by including in appointment notes  3. Give patient instructions for MyChart download to smartphone OR Doximity/Doxy.me as below if video visit (depending on what platform provider is using)  4. Confirm that appointment type is correct in Epic appointment notes (VIDEO vs PHONE)  5. Advise patient to be prepared with their blood pressure, heart rate, weight, any heart rhythm information, their current medicines, and a piece of paper and pen handy for any instructions they may receive the day of their visit  6. Inform patient they will receive a phone call 15 minutes prior to their appointment time (may be from unknown caller ID) so they should be prepared to answer    TELEPHONE CALL NOTE  Elizabeth Berg has been deemed  a candidate for a follow-up tele-health visit to limit community exposure during the Covid-19 pandemic. I spoke with the patient via phone to ensure availability of phone/video source, confirm preferred email & phone number, and discuss instructions and expectations.  I reminded Elizabeth Berg to be prepared with any vital sign and/or heart rhythm information that could potentially be obtained via home monitoring, at the time of her visit. I reminded Elizabeth Berg to expect a phone call prior to her visit.  Elizabeth Berg, [place] 11/07/2018  8:47 AM   INSTRUCTIONS FOR DOWNLOADING THE MYCHART APP TO SMARTPHONE  - The patient must first make sure to have activated MyChart and know their login information - If Apple, go to [employer] and type in MyChart in the search bar and download the app. If Android, ask patient to go to [employer] and type in [place] in the search bar and download the app. The app is free but as with any other app downloads, their phone may require them to verify saved payment information or Apple/Android password.  - The patient will need to then log into the app with their MyChart username and password, and select [facility] as their healthcare provider to link the account. When it is time for your visit, go to the MyChart app, find appointments, and click Begin Video Visit. Be sure to Select Allow for your device to access the Microphone and Camera for your visit. You will then be connected, and your provider will be with you shortly.  **If they have any issues connecting, or need assistance please contact MyChart service desk [phone])**  **If using a computer, in order to ensure the best quality for their visit they will need to use either of the following Internet Browsers: [employer], or Google Chrome**  IF USING DOXIMITY or DOXY.ME - The patient will receive a link just prior to their visit by text.     FULL LENGTH CONSENT FOR TELE-HEALTH VISIT   I hereby voluntarily request, consent and authorize [facility] and its employed or contracted physicians, physician assistants, nurse practitioners or other licensed health care professionals (the Practitioner), to provide me with telemedicine health care services (the "Services") as deemed necessary by the treating Practitioner. I acknowledge and consent to receive the Services by the Practitioner via telemedicine. I understand that the telemedicine visit will involve communicating with the Practitioner through live audiovisual  communication technology and the disclosure of certain medical information by electronic transmission. I acknowledge that I have been given the opportunity to request an in-person assessment or other available alternative prior to the telemedicine visit and am voluntarily participating in the telemedicine visit.  I understand that I have the right to withhold or withdraw my consent to the use of telemedicine in the course of my care at any time, without affecting my right to future care or treatment, and that the Practitioner or I may terminate the telemedicine visit at any time. I understand that I have the right to inspect all information obtained and/or recorded in the course of the telemedicine visit and may receive copies of available information for a reasonable fee.  I understand that some of the potential risks of receiving the Services via telemedicine include:  Marland Kitchen Delay or interruption in medical evaluation due to technological equipment failure or disruption; . Information transmitted may not be sufficient (e.g. poor resolution of images) to allow for appropriate medical decision making by the Practitioner; and/or  . In  rare instances, security protocols could fail, causing a breach of personal health information.  Furthermore, I acknowledge that it is my responsibility to provide information about my medical history, conditions and care that is complete and accurate to the best of my ability. I acknowledge that Practitioner's advice, recommendations, and/or decision may be based on factors not within their control, such as incomplete or inaccurate data provided by me or distortions of diagnostic images or specimens that may result from electronic transmissions. I understand that the practice of medicine is not an exact science and that Practitioner makes no warranties or guarantees regarding treatment outcomes. I acknowledge that I will receive a copy of this consent concurrently upon execution via  email to the email address I last provided but may also request a printed copy by calling the office of [facility].    I understand that my insurance will be billed for this visit.   I have read or had this consent read to me. . I understand the contents of this consent, which adequately explains the benefits and risks of the Services being provided via telemedicine.  . I have been provided ample opportunity to ask questions regarding this consent and the Services and have had my questions answered to my satisfaction. . I give my informed consent for the services to be provided through the use of telemedicine in my medical care  By participating in this telemedicine visit I agree to the above.

## 2018-11-07 NOTE — Progress Notes (Signed)
Virtual Visit via Video Note   This visit type was conducted due to national recommendations for restrictions regarding the COVID-19 Pandemic (e.g. social distancing) in an effort to limit this patient's exposure and mitigate transmission in our community.  Due to her co-morbid illnesses, this patient is at least at moderate risk for complications without adequate follow up.  This format is felt to be most appropriate for this patient at this time.  All issues noted in this document were discussed and addressed.  A limited physical exam was performed with this format.  Please refer to the patient's chart for her consent to telehealth for [facility].   Evaluation Performed:  Follow-up visit  Date:  11/07/2018   ID:  Elizabeth Berg, DOB 08-22-1950, MRN [id number]  Patient Location: Other:  passenger in vehicle Provider Location: Home  PCP:  Deland Pretty, MD  Cardiologist:  Sanda Klein, MD  Electrophysiologist:  None   Chief Complaint:  Palpitations, chest pain  History of Present Illness:    Elizabeth Berg is a 68 y.o. 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    Palpitations were well controlled for many years with verapamil in a very low dose.  Beta-blockers were added later.  Her palpitations are consistently associated with emotional stress.  She's generally done well from a cardiovascular point of view but a few days ago did have an episode of chest discomfort.  She was sitting at her desk and felt sudden severe pressure in between her breasts.  She coughed a couple of times and walks around the room the symptoms spontaneously resolved.  It sounds like it only lasted for less than a minute.  The weekend before she had walked over 10,000 steps including of steep hills and did not have any cardiac complaints.  She had a normal nuclear stress test in January 2019.  She has not  been troubled by dyspnea with activity, orthopnea, PND, syncope.  Her palpitations have been less problems than in the past.  She is only taking one half of a 25 mg tablet of metoprolol tartrate once daily.  She is taking her statin only twice a week.  Most recent lipid profile showed an LDL of 94.  Her most recent hemoglobin A1c was 6% (not on treatment for diabetes).  The patient does not have symptoms concerning for COVID-19 infection (fever, chills, cough, or new shortness of breath).    Past Medical History:  Diagnosis Date  . 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 GERD (gastroesophageal reflux disease)   . H/O bone density study   . H/O colonoscopy 04/14/09  . Heart burn   . Hypercholesterolemia   . Hypertension   . Palpitations   . Personal history of radiation therapy    right breast 2013  . Wears glasses    Past Surgical History:  Procedure Laterality Date  . bone density study    . BREAST BIOPSY  11/22/2011   right breast biopsy  . BREAST LUMPECTOMY  12/20/2011   right breast lumpectomy and right axillary lymph node biopsy  . CHOLECYSTECTOMY    . COLONOSCOPY    . THYROID SURGERY    . TOTAL THYROIDECTOMY  2008   Dr. Harlow Asa     Current Meds  Medication Sig  . levothyroxine (SYNTHROID, LEVOTHROID) 137 MCG tablet Take 137 mcg by mouth daily.  Marland Kitchen lisinopril (PRINIVIL,ZESTRIL)  10 MG tablet Take 10 mg by mouth daily.   . metoprolol tartrate (LOPRESSOR) 25 MG tablet Take 1 tablet (25 mg total) by mouth 2 (two) times daily. (Patient taking differently: Take 25 mg by mouth daily. )  . omeprazole (PRILOSEC) 40 MG capsule Take 40 mg by mouth 4 (four) times a week.   . rosuvastatin (CRESTOR) 10 MG tablet Take 10 mg by mouth 2 (two) times a week.   . verapamil (VERELAN PM) 120 MG 24 hr capsule Take 120 mg by mouth at bedtime.   . [DISCONTINUED] atorvastatin (LIPITOR) 20 MG tablet Take 20 mg by mouth 2 (two) times a week.   . [DISCONTINUED]  metroNIDAZOLE (METROGEL) 1 % gel metronidazole 1 % topical gel     Allergies:   Penicillins   Social History   Tobacco Use  . Smoking status: Never Smoker  . Smokeless tobacco: Never Used  Substance Use Topics  . Alcohol use: No  . Drug use: No     Family Hx: The patient's family history includes Cancer in her mother; Cancer (age of onset: 64) in her brother; Esophageal cancer in her mother; Rectal cancer in her brother.  ROS:   Please see the history of present illness.     All other systems reviewed and are negative.   Prior CV studies:   The following studies were reviewed today:  Nuclear stress test 2019  Labs/Other Tests and Data Reviewed:    EKG:  An ECG dated 11/02/2017 was personally reviewed today and demonstrated:  Normal sinus rhythm, T wave inversion in leads V1-V3 (chronic)  Recent Labs: hemoglobin 14.9, creatinine 0.9, hemoglobin A1c 6%  Recent Lipid Panel Total cholesterol 163, HDL 47, LDL 94  Wt Readings from Last 3 Encounters:  11/07/18 213 lb (96.6 kg)  11/01/17 208 lb (94.3 kg)  07/22/17 216 lb (98 kg)     Objective:    Vital Signs:  Ht 5\' 4"  (1.626 m)   Wt 213 lb (96.6 kg)   LMP 12/20/2006   BMI 36.56 kg/m    VITAL SIGNS:  reviewed GEN:  no acute distress EYES:  sclerae anicteric, EOMI - Extraocular Movements Intact RESPIRATORY:  normal respiratory effort, symmetric expansion CARDIOVASCULAR:  no peripheral edema SKIN:  no rash, lesions or ulcers. MUSCULOSKELETAL:  no obvious deformities. NEURO:  alert and oriented x 3, no obvious focal deficit PSYCH:  normal affect  ASSESSMENT & PLAN:    1. Palpitations: Since she is only taking the metoprolol once daily, will switch to metoprolol succinate.  At this point, additional medications do not appear necessary. 2. Peripheral venous insufficiency: Currently not troubled by edema 3. Obesity/prediabetes: She is trying to exercise.  Caloric restriction is also important. 4. Chest pain:  [place] episode occurred at rest and was actually improved with activity.  She is able to perform more intense physical activity without chest pain complaints.  She had a normal nuclear stress test last year.  Additional evaluation does not appear to be necessary at this time.  She is known to have chronic T wave inversion in the anteroseptal leads.  In the absence of known coronary or vascular disease her current LDL level of 94 is acceptable.  COVID-19 Education: The signs and symptoms of COVID-19 were discussed with the patient and how to seek care for testing (follow up with PCP or arrange E-visit).  The importance of social distancing was discussed today.  Time:   Today, I have spent 21 minutes with the patient  with telehealth technology discussing the above problems.     Medication Adjustments/Labs and Tests Ordered: Current medicines are reviewed at length with the patient today.  Concerns regarding medicines are outlined above.   Tests Ordered: No orders of the defined types were placed in this encounter.   Medication Changes: No orders of the defined types were placed in this encounter.   Disposition:  Follow up 12 months  Signed, Sanda Klein, MD  11/07/2018 3:23 PM    [place]

## 2018-11-07 NOTE — Patient Instructions (Signed)
Stop metoprolol tartrate. Start metoprolol succinate 12.5 mg (half of a 25 mg tablet) daily. If you have palpitations, please submit a recording from [facility] via [facility]. Follow-up in 12 months.

## 2018-11-14 DIAGNOSIS — L821 Other seborrheic keratosis: Secondary | ICD-10-CM | POA: Diagnosis not present

## 2018-11-14 DIAGNOSIS — D225 Melanocytic nevi of trunk: Secondary | ICD-10-CM | POA: Diagnosis not present

## 2018-11-14 DIAGNOSIS — L565 Disseminated superficial actinic porokeratosis (DSAP): Secondary | ICD-10-CM | POA: Diagnosis not present

## 2018-11-14 DIAGNOSIS — D485 Neoplasm of uncertain behavior of skin: Secondary | ICD-10-CM | POA: Diagnosis not present

## 2018-11-14 DIAGNOSIS — L570 Actinic keratosis: Secondary | ICD-10-CM | POA: Diagnosis not present

## 2018-11-14 DIAGNOSIS — D2362 Other benign neoplasm of skin of left upper limb, including shoulder: Secondary | ICD-10-CM | POA: Diagnosis not present

## 2018-11-14 DIAGNOSIS — Z85828 Personal history of other malignant neoplasm of skin: Secondary | ICD-10-CM | POA: Diagnosis not present

## 2018-11-21 ENCOUNTER — Ambulatory Visit: Payer: BLUE CROSS/BLUE SHIELD

## 2018-11-29 DIAGNOSIS — M5414 Radiculopathy, thoracic region: Secondary | ICD-10-CM | POA: Diagnosis not present

## 2018-11-29 DIAGNOSIS — M9902 Segmental and somatic dysfunction of thoracic region: Secondary | ICD-10-CM | POA: Diagnosis not present

## 2018-11-29 DIAGNOSIS — M546 Pain in thoracic spine: Secondary | ICD-10-CM | POA: Diagnosis not present

## 2018-12-04 DIAGNOSIS — M9902 Segmental and somatic dysfunction of thoracic region: Secondary | ICD-10-CM | POA: Diagnosis not present

## 2018-12-04 DIAGNOSIS — M546 Pain in thoracic spine: Secondary | ICD-10-CM | POA: Diagnosis not present

## 2018-12-04 DIAGNOSIS — M5414 Radiculopathy, thoracic region: Secondary | ICD-10-CM | POA: Diagnosis not present

## 2018-12-05 DIAGNOSIS — M546 Pain in thoracic spine: Secondary | ICD-10-CM | POA: Diagnosis not present

## 2018-12-05 DIAGNOSIS — M5414 Radiculopathy, thoracic region: Secondary | ICD-10-CM | POA: Diagnosis not present

## 2018-12-05 DIAGNOSIS — M9902 Segmental and somatic dysfunction of thoracic region: Secondary | ICD-10-CM | POA: Diagnosis not present

## 2018-12-07 DIAGNOSIS — M546 Pain in thoracic spine: Secondary | ICD-10-CM | POA: Diagnosis not present

## 2018-12-07 DIAGNOSIS — M9902 Segmental and somatic dysfunction of thoracic region: Secondary | ICD-10-CM | POA: Diagnosis not present

## 2018-12-07 DIAGNOSIS — M5414 Radiculopathy, thoracic region: Secondary | ICD-10-CM | POA: Diagnosis not present

## 2018-12-12 DIAGNOSIS — M5414 Radiculopathy, thoracic region: Secondary | ICD-10-CM | POA: Diagnosis not present

## 2018-12-12 DIAGNOSIS — M9902 Segmental and somatic dysfunction of thoracic region: Secondary | ICD-10-CM | POA: Diagnosis not present

## 2018-12-12 DIAGNOSIS — M546 Pain in thoracic spine: Secondary | ICD-10-CM | POA: Diagnosis not present

## 2018-12-14 DIAGNOSIS — M9902 Segmental and somatic dysfunction of thoracic region: Secondary | ICD-10-CM | POA: Diagnosis not present

## 2018-12-14 DIAGNOSIS — M546 Pain in thoracic spine: Secondary | ICD-10-CM | POA: Diagnosis not present

## 2018-12-14 DIAGNOSIS — M5414 Radiculopathy, thoracic region: Secondary | ICD-10-CM | POA: Diagnosis not present

## 2018-12-19 DIAGNOSIS — M546 Pain in thoracic spine: Secondary | ICD-10-CM | POA: Diagnosis not present

## 2018-12-19 DIAGNOSIS — M5414 Radiculopathy, thoracic region: Secondary | ICD-10-CM | POA: Diagnosis not present

## 2018-12-19 DIAGNOSIS — M545 Low back pain: Secondary | ICD-10-CM | POA: Diagnosis not present

## 2018-12-19 DIAGNOSIS — M25551 Pain in right hip: Secondary | ICD-10-CM | POA: Diagnosis not present

## 2018-12-19 DIAGNOSIS — M9902 Segmental and somatic dysfunction of thoracic region: Secondary | ICD-10-CM | POA: Diagnosis not present

## 2018-12-19 DIAGNOSIS — M5136 Other intervertebral disc degeneration, lumbar region: Secondary | ICD-10-CM | POA: Diagnosis not present

## 2018-12-21 DIAGNOSIS — M5414 Radiculopathy, thoracic region: Secondary | ICD-10-CM | POA: Diagnosis not present

## 2018-12-21 DIAGNOSIS — M9902 Segmental and somatic dysfunction of thoracic region: Secondary | ICD-10-CM | POA: Diagnosis not present

## 2018-12-21 DIAGNOSIS — M546 Pain in thoracic spine: Secondary | ICD-10-CM | POA: Diagnosis not present

## 2019-01-01 DIAGNOSIS — M546 Pain in thoracic spine: Secondary | ICD-10-CM | POA: Diagnosis not present

## 2019-01-01 DIAGNOSIS — M5414 Radiculopathy, thoracic region: Secondary | ICD-10-CM | POA: Diagnosis not present

## 2019-01-01 DIAGNOSIS — M9902 Segmental and somatic dysfunction of thoracic region: Secondary | ICD-10-CM | POA: Diagnosis not present

## 2019-01-02 DIAGNOSIS — M9902 Segmental and somatic dysfunction of thoracic region: Secondary | ICD-10-CM | POA: Diagnosis not present

## 2019-01-02 DIAGNOSIS — M546 Pain in thoracic spine: Secondary | ICD-10-CM | POA: Diagnosis not present

## 2019-01-02 DIAGNOSIS — M5414 Radiculopathy, thoracic region: Secondary | ICD-10-CM | POA: Diagnosis not present

## 2019-01-04 DIAGNOSIS — M5414 Radiculopathy, thoracic region: Secondary | ICD-10-CM | POA: Diagnosis not present

## 2019-01-04 DIAGNOSIS — M9902 Segmental and somatic dysfunction of thoracic region: Secondary | ICD-10-CM | POA: Diagnosis not present

## 2019-01-04 DIAGNOSIS — M546 Pain in thoracic spine: Secondary | ICD-10-CM | POA: Diagnosis not present

## 2019-01-09 ENCOUNTER — Other Ambulatory Visit: Payer: Self-pay

## 2019-01-09 ENCOUNTER — Other Ambulatory Visit: Payer: Self-pay | Admitting: Obstetrics and Gynecology

## 2019-01-09 ENCOUNTER — Ambulatory Visit
Admission: RE | Admit: 2019-01-09 | Discharge: 2019-01-09 | Disposition: A | Discharge status: Home / Self Care | Payer: BLUE CROSS/BLUE SHIELD | Source: Ambulatory Visit | Attending: Obstetrics and Gynecology | Admitting: Obstetrics and Gynecology

## 2019-01-09 ENCOUNTER — Ambulatory Visit: Payer: BLUE CROSS/BLUE SHIELD

## 2019-01-09 DIAGNOSIS — Z853 Personal history of malignant neoplasm of breast: Secondary | ICD-10-CM | POA: Diagnosis not present

## 2019-01-09 DIAGNOSIS — M5414 Radiculopathy, thoracic region: Secondary | ICD-10-CM | POA: Diagnosis not present

## 2019-01-09 DIAGNOSIS — Z139 Encounter for screening, unspecified: Secondary | ICD-10-CM

## 2019-01-09 DIAGNOSIS — M9902 Segmental and somatic dysfunction of thoracic region: Secondary | ICD-10-CM | POA: Diagnosis not present

## 2019-01-09 DIAGNOSIS — Z1231 Encounter for screening mammogram for malignant neoplasm of breast: Secondary | ICD-10-CM | POA: Diagnosis not present

## 2019-01-09 DIAGNOSIS — M546 Pain in thoracic spine: Secondary | ICD-10-CM | POA: Diagnosis not present

## 2019-01-11 DIAGNOSIS — M9902 Segmental and somatic dysfunction of thoracic region: Secondary | ICD-10-CM | POA: Diagnosis not present

## 2019-01-11 DIAGNOSIS — M5414 Radiculopathy, thoracic region: Secondary | ICD-10-CM | POA: Diagnosis not present

## 2019-01-11 DIAGNOSIS — M546 Pain in thoracic spine: Secondary | ICD-10-CM | POA: Diagnosis not present

## 2019-01-16 DIAGNOSIS — M546 Pain in thoracic spine: Secondary | ICD-10-CM | POA: Diagnosis not present

## 2019-01-16 DIAGNOSIS — M5136 Other intervertebral disc degeneration, lumbar region: Secondary | ICD-10-CM | POA: Diagnosis not present

## 2019-01-16 DIAGNOSIS — M9902 Segmental and somatic dysfunction of thoracic region: Secondary | ICD-10-CM | POA: Diagnosis not present

## 2019-01-16 DIAGNOSIS — M5414 Radiculopathy, thoracic region: Secondary | ICD-10-CM | POA: Diagnosis not present

## 2019-01-18 DIAGNOSIS — M5414 Radiculopathy, thoracic region: Secondary | ICD-10-CM | POA: Diagnosis not present

## 2019-01-18 DIAGNOSIS — M9902 Segmental and somatic dysfunction of thoracic region: Secondary | ICD-10-CM | POA: Diagnosis not present

## 2019-01-18 DIAGNOSIS — M546 Pain in thoracic spine: Secondary | ICD-10-CM | POA: Diagnosis not present

## 2019-01-23 DIAGNOSIS — M546 Pain in thoracic spine: Secondary | ICD-10-CM | POA: Diagnosis not present

## 2019-01-23 DIAGNOSIS — M9902 Segmental and somatic dysfunction of thoracic region: Secondary | ICD-10-CM | POA: Diagnosis not present

## 2019-01-23 DIAGNOSIS — M5414 Radiculopathy, thoracic region: Secondary | ICD-10-CM | POA: Diagnosis not present

## 2019-01-24 ENCOUNTER — Other Ambulatory Visit: Payer: Self-pay | Admitting: Cardiovascular Disease

## 2019-01-24 MED ORDER — METOPROLOL SUCCINATE ER 25 MG PO TB24: ORAL_TABLET | ORAL | 3 refills | Status: DC

## 2019-01-24 NOTE — Telephone Encounter (Signed)
Pt calling stating that her medication should have been sent to [facility]. I resent the pt's medication metoprolol to [facility] as requested. Confirmation received.

## 2019-01-25 DIAGNOSIS — M546 Pain in thoracic spine: Secondary | ICD-10-CM | POA: Diagnosis not present

## 2019-01-25 DIAGNOSIS — M5414 Radiculopathy, thoracic region: Secondary | ICD-10-CM | POA: Diagnosis not present

## 2019-01-25 DIAGNOSIS — M9902 Segmental and somatic dysfunction of thoracic region: Secondary | ICD-10-CM | POA: Diagnosis not present

## 2019-02-20 DIAGNOSIS — K219 Gastro-esophageal reflux disease without esophagitis: Secondary | ICD-10-CM | POA: Diagnosis not present

## 2019-02-20 DIAGNOSIS — K9041 Non-celiac gluten sensitivity: Secondary | ICD-10-CM | POA: Diagnosis not present

## 2019-02-20 DIAGNOSIS — Z800 Family history of malignant neoplasm of digestive organs: Secondary | ICD-10-CM | POA: Diagnosis not present

## 2019-02-20 DIAGNOSIS — Z1211 Encounter for screening for malignant neoplasm of colon: Secondary | ICD-10-CM | POA: Diagnosis not present

## 2019-03-06 DIAGNOSIS — R109 Unspecified abdominal pain: Secondary | ICD-10-CM | POA: Diagnosis not present

## 2019-03-06 DIAGNOSIS — R1030 Lower abdominal pain, unspecified: Secondary | ICD-10-CM | POA: Diagnosis not present

## 2019-03-06 DIAGNOSIS — K5900 Constipation, unspecified: Secondary | ICD-10-CM | POA: Diagnosis not present

## 2019-03-06 DIAGNOSIS — E890 Postprocedural hypothyroidism: Secondary | ICD-10-CM | POA: Diagnosis not present

## 2019-03-06 DIAGNOSIS — Z780 Asymptomatic menopausal state: Secondary | ICD-10-CM | POA: Diagnosis not present

## 2019-03-06 DIAGNOSIS — M8580 Other specified disorders of bone density and structure, unspecified site: Secondary | ICD-10-CM | POA: Diagnosis not present

## 2019-03-15 ENCOUNTER — Telehealth: Payer: Self-pay

## 2019-03-15 NOTE — Telephone Encounter (Signed)
   [facility] Pre-operative Risk Assessment    Request for surgical clearance:  1. What type of surgery is being performed? Colonoscopy    2. When is this surgery scheduled? 03/21/19   3. What type of clearance is required (medical clearance vs. Pharmacy clearance to hold med vs. Both)? Medical  4. Are there any medications that need to be held prior to surgery and how long? N/A   5. Practice name and name of physician performing surgery? [facility]   6. What is your office phone number [phone]    7.   What is your office fax number [phone]  8.   Anesthesia type (None, local, MAC, general) ? Propofol    Meryl Crutch 03/15/2019, 3:20 PM  _________________________________________________________________   (provider comments below)

## 2019-03-15 NOTE — Telephone Encounter (Signed)
   Primary Cardiologist: Sanda Klein, MD  Chart reviewed as part of pre-operative protocol coverage. Patient was contacted 03/15/2019 in reference to pre-operative risk assessment for pending surgery as outlined below.  Elizabeth Berg was last seen via virtual visit on 11/07/2018 by Dr. Sallyanne Kuster.  Since that day, Elizabeth Berg has done well without any chest pain or shortness of breath.  Therefore, based on ACC/AHA guidelines, the patient would be at acceptable risk for the planned procedure without further cardiovascular testing.   I will route this recommendation to the requesting party via Epic fax function and remove from pre-op pool.  Please call with questions.  [name], [place] 03/15/2019, 4:06 PM

## 2019-03-16 DIAGNOSIS — E890 Postprocedural hypothyroidism: Secondary | ICD-10-CM | POA: Diagnosis not present

## 2019-03-21 DIAGNOSIS — Z1211 Encounter for screening for malignant neoplasm of colon: Secondary | ICD-10-CM | POA: Diagnosis not present

## 2019-03-21 DIAGNOSIS — Z800 Family history of malignant neoplasm of digestive organs: Secondary | ICD-10-CM | POA: Diagnosis not present

## 2019-04-11 DIAGNOSIS — I87323 Chronic venous hypertension (idiopathic) with inflammation of bilateral lower extremity: Secondary | ICD-10-CM | POA: Diagnosis not present

## 2019-04-24 DIAGNOSIS — R350 Frequency of micturition: Secondary | ICD-10-CM | POA: Diagnosis not present

## 2019-04-24 DIAGNOSIS — N281 Cyst of kidney, acquired: Secondary | ICD-10-CM | POA: Diagnosis not present

## 2019-05-08 DIAGNOSIS — E7800 Pure hypercholesterolemia, unspecified: Secondary | ICD-10-CM | POA: Diagnosis not present

## 2019-05-15 DIAGNOSIS — E785 Hyperlipidemia, unspecified: Secondary | ICD-10-CM | POA: Diagnosis not present

## 2019-05-15 DIAGNOSIS — I10 Essential (primary) hypertension: Secondary | ICD-10-CM | POA: Diagnosis not present

## 2019-05-15 DIAGNOSIS — K219 Gastro-esophageal reflux disease without esophagitis: Secondary | ICD-10-CM | POA: Diagnosis not present

## 2019-05-15 DIAGNOSIS — E890 Postprocedural hypothyroidism: Secondary | ICD-10-CM | POA: Diagnosis not present

## 2019-06-06 ENCOUNTER — Other Ambulatory Visit: Payer: Self-pay

## 2019-06-06 DIAGNOSIS — Z20822 Contact with and (suspected) exposure to covid-19: Secondary | ICD-10-CM

## 2019-06-07 LAB — NOVEL CORONAVIRUS, NAA: SARS-CoV-2, NAA: DETECTED — AB

## 2019-06-18 DIAGNOSIS — Z9009 Acquired absence of other part of head and neck: Secondary | ICD-10-CM | POA: Diagnosis not present

## 2019-06-18 DIAGNOSIS — G609 Hereditary and idiopathic neuropathy, unspecified: Secondary | ICD-10-CM | POA: Diagnosis not present

## 2019-06-18 DIAGNOSIS — E890 Postprocedural hypothyroidism: Secondary | ICD-10-CM | POA: Diagnosis not present

## 2019-07-17 DIAGNOSIS — I8311 Varicose veins of right lower extremity with inflammation: Secondary | ICD-10-CM | POA: Diagnosis not present

## 2019-07-17 DIAGNOSIS — I8312 Varicose veins of left lower extremity with inflammation: Secondary | ICD-10-CM | POA: Diagnosis not present

## 2019-07-26 ENCOUNTER — Encounter: Payer: Self-pay | Admitting: *Deleted

## 2019-07-26 ENCOUNTER — Other Ambulatory Visit: Payer: Self-pay

## 2019-07-26 ENCOUNTER — Ambulatory Visit (INDEPENDENT_AMBULATORY_CARE_PROVIDER_SITE_OTHER): Payer: BC Managed Care – PPO | Admitting: Neurology

## 2019-07-26 ENCOUNTER — Encounter: Payer: Self-pay | Admitting: Neurology

## 2019-07-26 VITALS — BP 138/82 | HR 69 | Temp 97.1°F | Ht 64.0 in | Wt 214.0 lb

## 2019-07-26 DIAGNOSIS — G8324 Monoplegia of upper limb affecting left nondominant side: Secondary | ICD-10-CM | POA: Insufficient documentation

## 2019-07-26 DIAGNOSIS — R202 Paresthesia of skin: Secondary | ICD-10-CM | POA: Diagnosis not present

## 2019-07-26 DIAGNOSIS — M542 Cervicalgia: Secondary | ICD-10-CM

## 2019-07-26 NOTE — Progress Notes (Signed)
PATIENT: Elizabeth Berg DOB: 10-Mar-1951  Chief Complaint  Patient presents with  . Peripheral Neuropathy    Reports pain in her bilateral hands and feet, along with decreased sensations.  The symptoms have only been tempoarily relieved by massage.  She has never tried oral medications for her pain.   Marland Kitchen PCP    Deland Pretty, MD     HISTORICAL  [place] Birdsong Elizabeth Berg is a 69 year old female, seen in request by her primary care physician Dr. Shelia Media, Th[place] Jew for evaluation of bilateral feet and hands paresthesia, initial evaluation was on July 26, 2019.  I have reviewed and summarized the referring note from the referring physician.  She has past medical history of hypertension, hyperlipidemia, history of right breast cancer, status post surgery, radiation therapy, hypothyroidism, on supplement, she works at a desk job, as a city Teaching laboratory technician, tends to sit prolonged for prolonged period of time, she complains of chronic low back pain, neck pain, over the past few years, she has been receiving regular chiropractor maneuver of her neck,  Around middle of 2019, she noticed mild bilateral toes paresthesia, when she has cover over her feet, she felt her feet was numb, not normal, but she denies significant pain, symptoms become more obvious since middle of 2020, she noticed numbness at bilateral toes, also involving bilateral fingertips, she also complains of tight muscles, neck pain, radiating towards bilateral shoulder, arm, bilateral lateral leg, medial leg  She denies gait abnormality, denies bowel bladder incontinence  Laboratory evaluation showed mild elevated LDL 111, CMP showed elevated glucose 110, normal TSH  REVIEW OF SYSTEMS: Full 14 system review of systems performed and notable only for as above All other review of systems were negative.  ALLERGIES: Allergies  Allergen Reactions  . Penicillins     Childhood allergy; reaction unknown    HOME  MEDICATIONS: Current Outpatient Medications  Medication Sig Dispense Refill  . levothyroxine (SYNTHROID, LEVOTHROID) 137 MCG tablet Take 137 mcg by mouth daily.    Marland Kitchen lisinopril (PRINIVIL,ZESTRIL) 10 MG tablet Take 10 mg by mouth daily.     . metoprolol succinate (TOPROL XL) 25 MG 24 hr tablet Take 1/2 tablet ( 12.5 mg ) daily 45 tablet 3  . omeprazole (PRILOSEC) 40 MG capsule Take 40 mg by mouth 4 (four) times a week.     . rosuvastatin (CRESTOR) 10 MG tablet Take 10 mg by mouth 2 (two) times a week.     . verapamil (VERELAN PM) 120 MG 24 hr capsule Take 120 mg by mouth at bedtime.      No current facility-administered medications for this visit.    PAST MEDICAL HISTORY: Past Medical History:  Diagnosis Date  . 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 GERD (gastroesophageal reflux disease)   . H/O bone density study   . H/O colonoscopy 04/14/09  . Heart burn   . Hypercholesterolemia   . Hypertension   . Palpitations   . Personal history of radiation therapy    right breast 2013  . Wears glasses     PAST SURGICAL HISTORY: Past Surgical History:  Procedure Laterality Date  . bone density study    . BREAST BIOPSY  11/22/2011   right breast biopsy  . BREAST LUMPECTOMY  12/20/2011   right breast lumpectomy and right axillary lymph node biopsy  . CHOLECYSTECTOMY    . COLONOSCOPY    . THYROID SURGERY    . TOTAL  THYROIDECTOMY  2008   Dr. Harlow Asa    FAMILY HISTORY: Family History  Problem Relation Age of Onset  . Esophageal cancer Mother   . Cancer Mother        esophageal  . Rectal cancer Brother   . Cancer Brother 38       rectal    SOCIAL HISTORY: Social History   Socioeconomic History  . Marital status: Married    Spouse name: Not on file  . Number of children: 2  . Years of education: 27  . Highest education level: High school graduate  Occupational History  . Not on file  Tobacco Use  . Smoking status: Never Smoker  . Smokeless  tobacco: Never Used  Substance and Sexual Activity  . Alcohol use: No  . Drug use: No  . Sexual activity: Yes  Other Topics Concern  . Not on file  Social History Narrative   Lives at home with husband.    Right-handed.   No daily caffeine use.   Social Determinants of Health   Financial Resource Strain:   . Difficulty of Paying Living Expenses: Not on file  Food Insecurity:   . Worried About Charity fundraiser in the Last Year: Not on file  . Ran Out of Food in the Last Year: Not on file  Transportation Needs:   . Lack of Transportation (Medical): Not on file  . Lack of Transportation (Non-Medical): Not on file  Physical Activity:   . Days of Exercise per Week: Not on file  . Minutes of Exercise per Session: Not on file  Stress:   . Feeling of Stress : Not on file  Social Connections:   . Frequency of Communication with Friends and Family: Not on file  . Frequency of Social Gatherings with Friends and Family: Not on file  . Attends Religious Services: Not on file  . Active Member of Clubs or Organizations: Not on file  . Attends Archivist Meetings: Not on file  . Marital Status: Not on file  Intimate Partner Violence:   . Fear of Current or Ex-Partner: Not on file  . Emotionally Abused: Not on file  . Physically Abused: Not on file  . Sexually Abused: Not on file     PHYSICAL EXAM   Vitals:   07/26/19 0825  Temp: (!) 97.1 F (36.2 C)  Weight: 214 lb (97.1 kg)  Height: 5\' 4"  (1.626 m)    Not recorded      Body mass index is 36.73 kg/m.  PHYSICAL EXAMNIATION:  Gen: NAD, conversant, well nourised, well groomed                     Cardiovascular: Regular rate rhythm, no peripheral edema, warm, nontender. Eyes: Conjunctivae clear without exudates or hemorrhage Neck: Supple, no carotid bruits. Pulmonary: Clear to auscultation bilaterally   NEUROLOGICAL EXAM:  MENTAL STATUS: Speech:    Speech is normal; fluent and spontaneous with normal  comprehension.  Cognition:     Orientation to time, place and person     Normal recent and remote memory     Normal Attention span and concentration     Normal Language, naming, repeating,spontaneous speech     Fund of knowledge   CRANIAL NERVES: CN II: Visual fields are full to confrontation. Pupils are round equal and briskly reactive to light. CN III, IV, VI: extraocular movement are normal. No ptosis. CN V: Facial sensation is intact to light touch CN  VII: Face is symmetric with normal eye closure  CN VIII: Hearing is normal to causal conversation. CN IX, X: Phonation is normal. CN XI: Head turning and shoulder shrug are intact  MOTOR: There is no pronator drift of out-stretched arms. Muscle bulk and tone are normal. Muscle strength is normal.  REFLEXES: Reflexes are 2+ and symmetric at the biceps, triceps, 3/3 at knees, and ankles. Plantar responses are flexor.  SENSORY: Intact to light touch, pinprick and vibratory sensation are intact in fingers and toes.  COORDINATION: There is no trunk or limb dysmetria noted.  GAIT/STANCE: Posture is normal. Gait is steady with normal steps, base, arm swing, and turning. Heel and toe walking are normal. Tandem gait is normal.  Romberg is absent.   DIAGNOSTIC DATA (LABS, IMAGING, TESTING) - I reviewed patient records, labs, notes, testing and imaging myself where available.   ASSESSMENT AND PLAN  Sinia Heaslip Betty is a 69 y.o. female   Neck pain, radiating tightness towards bilateral shoulder, Bilateral toes and feet paresthesia  Hyperreflexia on examination, history of frequent chiropractor visit with neck maneuver  EMG nerve conduction study for evaluation of peripheral neuropathy  MRI of cervical spine to rule out cervical spondylitic myelopathy  Laboratory evaluations for potential etiology   Marcial Pacas, M.D. Ph.D.  [facility] [address], [place], [place] [address] Ph: [phone] Fax: [phone]  CC: Deland Pretty, MD

## 2019-07-31 LAB — MULTIPLE MYELOMA PANEL, SERUM
Albumin SerPl Elph-Mcnc: 3.5 g/dL (ref 2.9–4.4)
Albumin/Glob SerPl: 1.1 (ref 0.7–1.7)
Alpha 1: 0.2 g/dL (ref 0.0–0.4)
Alpha2 Glob SerPl Elph-Mcnc: 0.9 g/dL (ref 0.4–1.0)
B-Globulin SerPl Elph-Mcnc: 1.2 g/dL (ref 0.7–1.3)
Gamma Glob SerPl Elph-Mcnc: 1.1 g/dL (ref 0.4–1.8)
Globulin, Total: 3.4 g/dL (ref 2.2–3.9)
IgA/Immunoglobulin A, Serum: 289 mg/dL (ref 87–352)
IgG (Immunoglobin G), Serum: 1158 mg/dL (ref 586–1602)
IgM (Immunoglobulin M), Srm: 87 mg/dL (ref 26–217)
Total Protein: 6.9 g/dL (ref 6.0–8.5)

## 2019-07-31 LAB — SEDIMENTATION RATE: Sed Rate: 8 mm/hr (ref 0–40)

## 2019-07-31 LAB — VITAMIN B12: Vitamin B-12: 464 pg/mL (ref 232–1245)

## 2019-07-31 LAB — RPR: RPR Ser Ql: NONREACTIVE

## 2019-07-31 LAB — CK: Total CK: 64 U/L (ref 32–182)

## 2019-07-31 LAB — FOLATE: Folate: 17.9 ng/mL (ref >3.0)

## 2019-07-31 LAB — C-REACTIVE PROTEIN: CRP: 10 mg/L (ref 0–10)

## 2019-07-31 LAB — VITAMIN D 25 HYDROXY (VIT D DEFICIENCY, FRACTURES): Vit D, 25-Hydroxy: 20.8 ng/mL — ABNORMAL LOW (ref 30.0–100.0)

## 2019-07-31 LAB — ANA W/REFLEX IF POSITIVE: Anti Nuclear Antibody (ANA): NEGATIVE

## 2019-08-01 ENCOUNTER — Telehealth: Payer: Self-pay

## 2019-08-01 ENCOUNTER — Telehealth: Payer: Self-pay | Admitting: *Deleted

## 2019-08-01 NOTE — Telephone Encounter (Signed)
-----   Message from Penni Bombard, MD sent at 08/01/2019  4:26 PM EST ----- Unremarkable labs. Except slightly low vitamin D (may take OTC vitamin D supplement 1000 units per day and follow up with PCP). Continue current plan. Please call patient. -VRP

## 2019-08-01 NOTE — Telephone Encounter (Signed)
I spoke to the patient and provided her with the lab results.  She was in agreement to start the recommended vitamin D supplement.

## 2019-08-01 NOTE — Telephone Encounter (Signed)
[employer] is not approving the MRI c-spine for the following reason:  CLINICAL CRITERIA The criteria below may help you determine if this exam is clinically appropriate. Advanced imaging may be indicated when conservative treatment requirements have been met and symptoms have not improved or resolved. Further clinical review is required.  MD or nurse can contact [facility] at [phone]. The case is due to close on 08/03/19.

## 2019-08-02 NOTE — Telephone Encounter (Signed)
I spent a total of 120 minutes on hold trying to get through today. I am afraid that if I try again tomorrow and I can;t get through that the MRI of the cervical spine will be denied and we will have to go through a lengthy appeal process. I would rather rescind the MRI and have Elizabeth Berg call patient and get more clinical information such as whether she had PT, things she has tried and failed, so I can addend dr Rhea Belton notes with more clinical information and we can try to reorder next week and get it through without needing a peer-to-peer. Elizabeth Berg would you please call patient for above (lets discuss) and also advise her on the situation and let her know that for anything new or concerning she would need to go to the ED to get an MRI until we can get it approved. Thank you.

## 2019-08-02 NOTE — Telephone Encounter (Signed)
I spent 45 minutes on hold waiting for a per-to-peer today unfortunately had to han up to continue my clinic. I will try again later this afternoon. Elmyra Ricks, is there a case number? ([place])

## 2019-08-02 NOTE — Telephone Encounter (Addendum)
I called the patient and she reported the following conservative treatments:  1) chiropractor care 2) NSAIDS 3) cold/warm compresses 4) stretching 5) monthly massage therapy 6) acupuncture  The patient is aware we are working on an approval for her.

## 2019-08-07 DIAGNOSIS — I8311 Varicose veins of right lower extremity with inflammation: Secondary | ICD-10-CM | POA: Diagnosis not present

## 2019-08-07 DIAGNOSIS — I8312 Varicose veins of left lower extremity with inflammation: Secondary | ICD-10-CM | POA: Diagnosis not present

## 2019-08-08 NOTE — Telephone Encounter (Signed)
I resubmitted auth request for MRI Cervical Spine wo contrast and [employer] is still stating that it does not meet the criteria:   "Advanced imaging may be indicated when conservative treatment requirements have been met and symptoms have not improved or resolved. Further clinical review is required."  Dr. Willis can contact [facility] at [phone]. There is no case/ref #, you just need patient information.   [employer] Member ID: [id number] 

## 2019-08-08 NOTE — Telephone Encounter (Signed)
Great! Thank you so much. We will contact patient to get her scheduled.

## 2019-08-08 NOTE — Telephone Encounter (Signed)
I called for peer to peer review, the cervical spine MRI has been approved, approval [id number].  Approval dates are from 08/08/2019 through 09/06/2019.

## 2019-08-14 ENCOUNTER — Ambulatory Visit: Payer: BLUE CROSS/BLUE SHIELD | Admitting: Neurology

## 2019-08-14 ENCOUNTER — Other Ambulatory Visit: Payer: Self-pay

## 2019-08-14 ENCOUNTER — Ambulatory Visit: Payer: BC Managed Care – PPO

## 2019-08-14 DIAGNOSIS — R202 Paresthesia of skin: Secondary | ICD-10-CM

## 2019-08-14 DIAGNOSIS — M542 Cervicalgia: Secondary | ICD-10-CM | POA: Diagnosis not present

## 2019-09-10 ENCOUNTER — Encounter (INDEPENDENT_AMBULATORY_CARE_PROVIDER_SITE_OTHER): Payer: BC Managed Care – PPO | Admitting: Neurology

## 2019-09-10 ENCOUNTER — Ambulatory Visit (INDEPENDENT_AMBULATORY_CARE_PROVIDER_SITE_OTHER): Payer: BC Managed Care – PPO | Admitting: Neurology

## 2019-09-10 ENCOUNTER — Other Ambulatory Visit: Payer: Self-pay

## 2019-09-10 DIAGNOSIS — R202 Paresthesia of skin: Secondary | ICD-10-CM | POA: Diagnosis not present

## 2019-09-10 DIAGNOSIS — M542 Cervicalgia: Secondary | ICD-10-CM

## 2019-09-10 DIAGNOSIS — Z0289 Encounter for other administrative examinations: Secondary | ICD-10-CM

## 2019-09-10 NOTE — Procedures (Signed)
Full Name: Elizabeth Berg Gender: Female MRN #: [id number] Date of Birth: [birth year]    Visit Date: 09/10/2019 09:57 Age: 69 Years Examining Physician: Marcial Pacas, MD  Referring Physician: Marcial Pacas, MD History:  69 year old female, complains of few months history of bilateral feet stinging discomfort  Summary of the tests: Nerve conduction study: Bilateral sural, superficial peroneal sensory responses were normal.  Bilateral peroneal to EDB and tibial motor responses were normal  Electromyography:  Selective needle examinations of right lower extremity muscles were normal.  Conclusion: This is a normal study.  There is no electrodiagnostic evidence of large fiber peripheral neuropathy.    ------------------------------- Marcial Pacas, M.D. PhD  [facility] [place], [place] [address] Tel: [phone] Fax: [phone]         [facility]    Nerve / Sites Muscle Latency Ref. Amplitude Ref. Rel Amp Segments Distance Velocity Ref. Area    ms ms mV mV %  cm m/s m/s mVms  R Peroneal - EDB     Ankle EDB 4.5 ?6.5 5.1 ?2.0 100 Ankle - EDB 9   17.5     Fib head EDB 10.1  5.1  101 Fib head - Ankle 29 52 ?44 18.5     Pop fossa EDB 12.3  3.9  76.3 Pop fossa - Fib head 10 45 ?44 14.2         Pop fossa - Ankle      L Peroneal - EDB     Ankle EDB 4.3 ?6.5 5.6 ?2.0 100 Ankle - EDB 9   15.6     Fib head EDB 9.6  5.1  90.6 Fib head - Ankle 29 54 ?44 14.8     Pop fossa EDB 11.5  5.1  100 Pop fossa - Fib head 10 55 ?44 15.1         Pop fossa - Ankle      R Tibial - AH     Ankle AH 3.3 ?5.8 9.1 ?4.0 100 Ankle - AH 9   18.7     Pop fossa AH 11.2  7.5  81.6 Pop fossa - Ankle 35 44 ?41 18.5  L Tibial - AH     Ankle AH 4.1 ?5.8 8.5 ?4.0 100 Ankle - AH 9   17.9     Pop fossa AH 12.1  7.6  89.7 Pop fossa - Ankle 35 44 ?41 19.0             SNC    Nerve / Sites Rec. Site Peak Lat Ref.  Amp Ref. Segments Distance    ms ms V V  cm  R Sural - Ankle (Calf)     Calf Ankle 3.1 ?4.4 14 ?6 Calf - Ankle 14  L Sural - Ankle (Calf)     Calf Ankle 3.3 ?4.4 12 ?6 Calf - Ankle 14  R Superficial peroneal - Ankle     Lat leg Ankle 2.8 ?4.4 5 ?6 Lat leg - Ankle 14  L Superficial peroneal - Ankle     Lat leg Ankle 3.3 ?4.4 6 ?6 Lat leg - Ankle 14             F  Wave    Nerve F Lat Ref.   ms ms  R Tibial - AH 47.8 ?56.0  L Tibial - AH 47.4 ?56.0         EMG Summary Table    Spontaneous MUAP Recruitment  Muscle IA Fib PSW Fasc Other Amp Dur. Poly Pattern  R. Tibialis anterior Normal None None None _______ Normal Normal Normal Normal  R. Tibialis posterior Normal None None None _______ Normal Normal Normal Normal  R. Peroneus longus Normal None None None _______ Normal Normal Normal Normal  R. Gastrocnemius (Medial head) Normal None None None _______ Normal Normal Normal Normal  R. Vastus lateralis Normal None None None _______ Normal Normal Normal Normal  R. Abductor hallucis Normal None None None _______ Normal Normal Normal Normal

## 2019-09-13 ENCOUNTER — Other Ambulatory Visit: Payer: Self-pay | Admitting: Obstetrics and Gynecology

## 2019-09-13 DIAGNOSIS — Z1231 Encounter for screening mammogram for malignant neoplasm of breast: Secondary | ICD-10-CM

## 2019-10-09 DIAGNOSIS — Z6836 Body mass index (BMI) 36.0-36.9, adult: Secondary | ICD-10-CM | POA: Diagnosis not present

## 2019-10-09 DIAGNOSIS — Z124 Encounter for screening for malignant neoplasm of cervix: Secondary | ICD-10-CM | POA: Diagnosis not present

## 2019-10-09 DIAGNOSIS — Z01419 Encounter for gynecological examination (general) (routine) without abnormal findings: Secondary | ICD-10-CM | POA: Diagnosis not present

## 2019-11-06 DIAGNOSIS — E039 Hypothyroidism, unspecified: Secondary | ICD-10-CM | POA: Diagnosis not present

## 2019-11-06 DIAGNOSIS — R7303 Prediabetes: Secondary | ICD-10-CM | POA: Diagnosis not present

## 2019-11-06 DIAGNOSIS — E7800 Pure hypercholesterolemia, unspecified: Secondary | ICD-10-CM | POA: Diagnosis not present

## 2019-11-06 DIAGNOSIS — I10 Essential (primary) hypertension: Secondary | ICD-10-CM | POA: Diagnosis not present

## 2019-11-06 DIAGNOSIS — E559 Vitamin D deficiency, unspecified: Secondary | ICD-10-CM | POA: Diagnosis not present

## 2019-11-07 DIAGNOSIS — I8312 Varicose veins of left lower extremity with inflammation: Secondary | ICD-10-CM | POA: Diagnosis not present

## 2019-11-13 DIAGNOSIS — I10 Essential (primary) hypertension: Secondary | ICD-10-CM | POA: Diagnosis not present

## 2019-11-13 DIAGNOSIS — E538 Deficiency of other specified B group vitamins: Secondary | ICD-10-CM | POA: Diagnosis not present

## 2019-11-13 DIAGNOSIS — E785 Hyperlipidemia, unspecified: Secondary | ICD-10-CM | POA: Diagnosis not present

## 2019-11-13 DIAGNOSIS — E890 Postprocedural hypothyroidism: Secondary | ICD-10-CM | POA: Diagnosis not present

## 2019-11-13 DIAGNOSIS — Z0000 Encounter for general adult medical examination without abnormal findings: Secondary | ICD-10-CM | POA: Diagnosis not present

## 2019-11-20 DIAGNOSIS — L821 Other seborrheic keratosis: Secondary | ICD-10-CM | POA: Diagnosis not present

## 2019-11-20 DIAGNOSIS — L565 Disseminated superficial actinic porokeratosis (DSAP): Secondary | ICD-10-CM | POA: Diagnosis not present

## 2019-11-20 DIAGNOSIS — M79605 Pain in left leg: Secondary | ICD-10-CM | POA: Diagnosis not present

## 2019-11-20 DIAGNOSIS — Z85828 Personal history of other malignant neoplasm of skin: Secondary | ICD-10-CM | POA: Diagnosis not present

## 2019-11-20 DIAGNOSIS — D225 Melanocytic nevi of trunk: Secondary | ICD-10-CM | POA: Diagnosis not present

## 2019-11-20 DIAGNOSIS — D485 Neoplasm of uncertain behavior of skin: Secondary | ICD-10-CM | POA: Diagnosis not present

## 2020-01-15 ENCOUNTER — Other Ambulatory Visit: Payer: Self-pay

## 2020-01-15 ENCOUNTER — Ambulatory Visit
Admission: RE | Admit: 2020-01-15 | Discharge: 2020-01-15 | Disposition: A | Discharge status: Home / Self Care | Payer: BC Managed Care – PPO | Source: Ambulatory Visit | Attending: Obstetrics and Gynecology | Admitting: Obstetrics and Gynecology

## 2020-01-15 DIAGNOSIS — Z1231 Encounter for screening mammogram for malignant neoplasm of breast: Secondary | ICD-10-CM | POA: Diagnosis not present

## 2020-03-04 ENCOUNTER — Other Ambulatory Visit: Payer: Self-pay | Admitting: Cardiovascular Disease

## 2020-03-04 ENCOUNTER — Other Ambulatory Visit: Payer: Self-pay

## 2020-03-04 NOTE — Telephone Encounter (Signed)
New Message   *STAT* If patient is at the pharmacy, call can be transferred to refill team.   1. Which medications need to be refilled? (please list name of each medication and dose if known) metoprolol succinate (TOPROL XL) 25 MG 24 hr tablet  2. Which pharmacy/location (including street and city if local pharmacy) is medication to be sent to? [facility] [place], [place] AT Portal to Registered [employer] Sites  3. Do they need a 30 day or 90 day supply? 90 day

## 2020-03-05 MED ORDER — METOPROLOL SUCCINATE ER 25 MG PO TB24: ORAL_TABLET | ORAL | 0 refills | Status: DC

## 2020-03-05 NOTE — Telephone Encounter (Signed)
Rx has been sent to the pharmacy electronically. ° °

## 2020-03-06 ENCOUNTER — Other Ambulatory Visit: Payer: Self-pay

## 2020-03-29 ENCOUNTER — Other Ambulatory Visit: Payer: Self-pay | Admitting: Cardiovascular Disease

## 2020-04-07 DIAGNOSIS — E890 Postprocedural hypothyroidism: Secondary | ICD-10-CM | POA: Diagnosis not present

## 2020-04-09 DIAGNOSIS — R1030 Lower abdominal pain, unspecified: Secondary | ICD-10-CM | POA: Diagnosis not present

## 2020-04-09 DIAGNOSIS — R1032 Left lower quadrant pain: Secondary | ICD-10-CM | POA: Diagnosis not present

## 2020-04-09 DIAGNOSIS — R1031 Right lower quadrant pain: Secondary | ICD-10-CM | POA: Diagnosis not present

## 2020-04-10 DIAGNOSIS — R7303 Prediabetes: Secondary | ICD-10-CM | POA: Diagnosis not present

## 2020-04-10 DIAGNOSIS — E785 Hyperlipidemia, unspecified: Secondary | ICD-10-CM | POA: Diagnosis not present

## 2020-04-10 DIAGNOSIS — E890 Postprocedural hypothyroidism: Secondary | ICD-10-CM | POA: Diagnosis not present

## 2020-04-10 DIAGNOSIS — I10 Essential (primary) hypertension: Secondary | ICD-10-CM | POA: Diagnosis not present

## 2020-04-15 DIAGNOSIS — E538 Deficiency of other specified B group vitamins: Secondary | ICD-10-CM | POA: Diagnosis not present

## 2020-05-01 ENCOUNTER — Other Ambulatory Visit: Payer: Self-pay | Admitting: Internal Medicine

## 2020-05-01 DIAGNOSIS — R1030 Lower abdominal pain, unspecified: Secondary | ICD-10-CM

## 2020-05-06 DIAGNOSIS — E785 Hyperlipidemia, unspecified: Secondary | ICD-10-CM | POA: Diagnosis not present

## 2020-05-07 ENCOUNTER — Ambulatory Visit
Admission: RE | Admit: 2020-05-07 | Discharge: 2020-05-07 | Disposition: A | Discharge status: Home / Self Care | Payer: BC Managed Care – PPO | Source: Ambulatory Visit | Attending: Internal Medicine | Admitting: Internal Medicine

## 2020-05-07 DIAGNOSIS — R1030 Lower abdominal pain, unspecified: Secondary | ICD-10-CM | POA: Diagnosis not present

## 2020-05-07 DIAGNOSIS — D252 Subserosal leiomyoma of uterus: Secondary | ICD-10-CM | POA: Diagnosis not present

## 2020-05-13 DIAGNOSIS — E039 Hypothyroidism, unspecified: Secondary | ICD-10-CM | POA: Diagnosis not present

## 2020-05-13 DIAGNOSIS — E875 Hyperkalemia: Secondary | ICD-10-CM | POA: Diagnosis not present

## 2020-05-13 DIAGNOSIS — E7800 Pure hypercholesterolemia, unspecified: Secondary | ICD-10-CM | POA: Diagnosis not present

## 2020-05-13 DIAGNOSIS — I10 Essential (primary) hypertension: Secondary | ICD-10-CM | POA: Diagnosis not present

## 2020-05-14 ENCOUNTER — Other Ambulatory Visit: Payer: Self-pay | Admitting: Obstetrics and Gynecology

## 2020-05-14 DIAGNOSIS — Z6836 Body mass index (BMI) 36.0-36.9, adult: Secondary | ICD-10-CM | POA: Diagnosis not present

## 2020-05-14 DIAGNOSIS — R102 Pelvic and perineal pain: Secondary | ICD-10-CM | POA: Diagnosis not present

## 2020-05-14 DIAGNOSIS — R109 Unspecified abdominal pain: Secondary | ICD-10-CM

## 2020-05-29 ENCOUNTER — Other Ambulatory Visit: Payer: Self-pay

## 2020-05-29 ENCOUNTER — Ambulatory Visit
Admission: RE | Admit: 2020-05-29 | Discharge: 2020-05-29 | Disposition: A | Discharge status: Home / Self Care | Payer: BC Managed Care – PPO | Source: Ambulatory Visit | Attending: Obstetrics and Gynecology | Admitting: Obstetrics and Gynecology

## 2020-05-29 DIAGNOSIS — N281 Cyst of kidney, acquired: Secondary | ICD-10-CM | POA: Diagnosis not present

## 2020-05-29 DIAGNOSIS — R109 Unspecified abdominal pain: Secondary | ICD-10-CM

## 2020-05-29 DIAGNOSIS — R102 Pelvic and perineal pain: Secondary | ICD-10-CM | POA: Diagnosis not present

## 2020-05-29 DIAGNOSIS — K7689 Other specified diseases of liver: Secondary | ICD-10-CM | POA: Diagnosis not present

## 2020-05-29 DIAGNOSIS — K429 Umbilical hernia without obstruction or gangrene: Secondary | ICD-10-CM | POA: Diagnosis not present

## 2020-05-29 MED ORDER — IOPAMIDOL (ISOVUE-300) INJECTION 61%
100.0000 mL | Freq: Once | INTRAVENOUS | Status: AC | PRN
  Administered 2020-05-29: 100 mL via INTRAVENOUS

## 2020-05-30 ENCOUNTER — Other Ambulatory Visit: Payer: Self-pay | Admitting: Obstetrics and Gynecology

## 2020-06-11 ENCOUNTER — Other Ambulatory Visit: Payer: Self-pay

## 2020-06-11 ENCOUNTER — Ambulatory Visit (INDEPENDENT_AMBULATORY_CARE_PROVIDER_SITE_OTHER): Payer: BC Managed Care – PPO | Admitting: Cardiovascular Disease

## 2020-06-11 ENCOUNTER — Encounter: Payer: Self-pay | Admitting: Cardiovascular Disease

## 2020-06-11 VITALS — BP 144/72 | HR 70 | Ht 64.0 in | Wt 205.8 lb

## 2020-06-11 DIAGNOSIS — I10 Essential (primary) hypertension: Secondary | ICD-10-CM

## 2020-06-11 DIAGNOSIS — E7800 Pure hypercholesterolemia, unspecified: Secondary | ICD-10-CM

## 2020-06-11 DIAGNOSIS — R002 Palpitations: Secondary | ICD-10-CM

## 2020-06-11 DIAGNOSIS — R7303 Prediabetes: Secondary | ICD-10-CM | POA: Diagnosis not present

## 2020-06-11 DIAGNOSIS — I872 Venous insufficiency (chronic) (peripheral): Secondary | ICD-10-CM

## 2020-06-11 DIAGNOSIS — E890 Postprocedural hypothyroidism: Secondary | ICD-10-CM | POA: Diagnosis not present

## 2020-06-11 DIAGNOSIS — Z6835 Body mass index (BMI) 35.0-35.9, adult: Secondary | ICD-10-CM

## 2020-06-11 MED ORDER — ROSUVASTATIN CALCIUM 10 MG PO TABS: 10.0000 mg | ORAL_TABLET | ORAL | 11 refills | Status: AC

## 2020-06-11 NOTE — Progress Notes (Signed)
Cardiology office note    Date:  06/13/2020   ID:  Elizabeth Berg, DOB 08-Sep-1950, MRN [id number] PCP:  Deland Pretty, MD  Cardiologist:  Sanda Klein, MD  Electrophysiologist:  None   Chief Complaint:  Palpitations  History of Present Illness:    Elizabeth Berg is a 68 y.o. 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  Her husband [name] is also my patient and their son [name] has been referred to me.  She currently does not have any chest palpitations, but has some unusual pulsation sensations in her lower abdomen, infraumbilical.  She denies chest pain or shortness of breath at rest or with activity.  Her palpitations have been well controlled on a combination of verapamil and metoprolol.  Both in low doses.  The patient specifically denies any chest pain at rest exertion, dyspnea at rest or with exertion, orthopnea, paroxysmal nocturnal dyspnea, syncope, palpitations, focal neurological deficits, intermittent claudication, lower extremity edema, unexplained weight gain, cough, hemoptysis or wheezing.  She had a normal nuclear stress test in January 2019.    Her most recent lipid profile showed an LDL cholesterol of 118, slightly increased from the past.  She does not have known CAD or PAD.  Past Medical History:  Diagnosis Date  . 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 GERD (gastroesophageal reflux disease)   . H/O bone density study   . H/O colonoscopy 04/14/09  . Heart burn   . Hypercholesterolemia   . Hypertension   . Palpitations   . Personal history of radiation therapy    right breast 2013  . Wears glasses    Past Surgical History:  Procedure Laterality Date  . bone density study    . BREAST BIOPSY  11/22/2011   right breast biopsy  . BREAST LUMPECTOMY  12/20/2011   right breast lumpectomy and right axillary lymph  node biopsy  . CHOLECYSTECTOMY    . COLONOSCOPY    . THYROID SURGERY    . TOTAL THYROIDECTOMY  2008   Dr. Harlow Asa     Current Meds  Medication Sig  . cholecalciferol (VITAMIN D3) 25 MCG (1000 UNIT) tablet Take 1,000 Units by mouth daily.  Marland Kitchen levothyroxine (SYNTHROID, LEVOTHROID) 137 MCG tablet Take 137 mcg by mouth daily.  Marland Kitchen lisinopril (PRINIVIL,ZESTRIL) 10 MG tablet Take 10 mg by mouth daily.   . metoprolol succinate (TOPROL-XL) 25 MG 24 hr tablet TAKE 1/2 TABLET BY MOUTH ONCE DAILY  . omeprazole (PRILOSEC) 40 MG capsule Take 40 mg by mouth 4 (four) times a week.   . rosuvastatin (CRESTOR) 10 MG tablet Take 1 tablet (10 mg total) by mouth 3 (three) times a week.  . verapamil (VERELAN PM) 120 MG 24 hr capsule Take 120 mg by mouth at bedtime.   . [DISCONTINUED] rosuvastatin (CRESTOR) 10 MG tablet Take 10 mg by mouth 2 (two) times a week.      Allergies:   Penicillins   Social History   Tobacco Use  . Smoking status: Never Smoker  . Smokeless tobacco: Never Used  Substance Use Topics  . Alcohol use: No  . Drug use: No     Family Hx: The patient's family history includes Cancer in her mother; Cancer (age of onset: 43) in her brother; Esophageal cancer in her mother; Rectal cancer in her brother.  ROS:   Please see the history of present illness.  All other systems reviewed and are negative.   Prior CV studies:   The following studies were reviewed today:  Nuclear stress test 2019  Labs/Other Tests and Data Reviewed:    EKG: Ordered today shows normal sinus rhythm with a very subtle diffuse ST segment depression, T wave inversion in leads V1-V2 (chronic), QTC 449 ms Recent Labs: 05/08/2019 Creatinine 0.94, normal liver function tests  Recent Lipid Panel Lipid Panel  No results found for: CHOL, TRIG, HDL, CHOLHDL, VLDL, LDLCALC, LDLDIRECT, LABVLDL  05/06/2020 HDL cholesterol 51, LDL cholesterol 118, triglycerides 104, TSH 0.86  Wt Readings from Last 3  Encounters:  06/11/20 205 lb 12.8 oz (93.4 kg)  07/26/19 214 lb (97.1 kg)  11/07/18 213 lb (96.6 kg)     Objective:    Vital Signs:  BP (!) 144/72   Pulse 70   Ht 5\' 4"  (1.626 m)   Wt 205 lb 12.8 oz (93.4 kg)   LMP 12/20/2006   SpO2 98%   BMI 35.33 kg/m     General: Alert, oriented x3, no distress, moderately obese Head: no evidence of trauma, PERRL, EOMI, no exophtalmos or lid lag, no myxedema, no xanthelasma; normal ears, nose and oropharynx Neck: normal jugular venous pulsations and no hepatojugular reflux; brisk carotid pulses without delay and no carotid bruits Chest: clear to auscultation, no signs of consolidation by percussion or palpation, normal fremitus, symmetrical and full respiratory excursions Cardiovascular: normal position and quality of the apical impulse, regular rhythm, normal first and second heart sounds, no murmurs, rubs or gallops Abdomen: no tenderness or distention, no masses by palpation, no abnormal pulsatility or arterial bruits, normal bowel sounds, no hepatosplenomegaly Extremities: no clubbing, cyanosis or edema; 2+ radial, ulnar and brachial pulses bilaterally; 2+ right femoral, posterior tibial and dorsalis pedis pulses; 2+ left femoral, posterior tibial and dorsalis pedis pulses; no subclavian or femoral bruits Neurological: grossly nonfocal Psych: Normal mood and affect   ASSESSMENT & PLAN:    1. Palpitations: Well-controlled.  I do not think her abdominal pulsations have anything to do with her heart. 2. Peripheral venous insufficiency: Currently without any problems with edema. 3. Obesity/prediabetes: She has lost almost 10 pounds since the beginning of the year.  Continue attempts to increase physical activity and reduce calories in diet, especially carbohydrates. 4. HLP: Increase the rosuvastatin to 3 days a week, target LDL less than 100.  Reports side effects when taking a statin on a daily basis, but no complaints at this time. 5. HTN:  Fair control  Patient Instructions  Medication Instructions:  INCREASE the Rosuvastatin to 10 mg three times weekly.   *If you need a refill on your cardiac medications before your next appointment, please call your pharmacy*   Lab Work: None ordered If you have labs (blood work) drawn today and your tests are completely normal, you will receive your results only by: Marland Kitchen MyChart Message (if you have MyChart) OR . A paper copy in the mail If you have any lab test that is abnormal or we need to change your treatment, we will call you to review the results.   Testing/Procedures: None ordered   Follow-Up: At [facility], you and your health needs are our priority.  As part of our continuing mission to provide you with exceptional heart care, we have created designated Provider Care Teams.  These Care Teams include your primary Cardiologist (physician) and Advanced Practice Providers (APPs -  Physician Assistants and Nurse Practitioners) who all work together to provide  you with the care you need, when you need it.  We recommend signing up for the patient portal called "MyChart".  Sign up information is provided on this After Visit Summary.  MyChart is used to connect with patients for Virtual Visits (Telemedicine).  Patients are able to view lab/test results, encounter notes, upcoming appointments, etc.  Non-urgent messages can be sent to your provider as well.   To learn more about what you can do with MyChart, go to [email or web address].    Your next appointment:   12 month(s)  The format for your next appointment:   In Person  Provider:   You may see Sanda Klein, MD or one of the following Advanced Practice Providers on your designated Care Team:    Almyra Deforest, PA-C  Fabian Sharp, PA-C or   Roby Lofts, [place]   Disposition:  Follow up 12 months  Signed, Sanda Klein, MD  06/13/2020 2:15 PM    Gas

## 2020-06-11 NOTE — Patient Instructions (Signed)
Medication Instructions:  INCREASE the Rosuvastatin to 10 mg three times weekly.   *If you need a refill on your cardiac medications before your next appointment, please call your pharmacy*   Lab Work: None ordered If you have labs (blood work) drawn today and your tests are completely normal, you will receive your results only by: Marland Kitchen MyChart Message (if you have MyChart) OR . A paper copy in the mail If you have any lab test that is abnormal or we need to change your treatment, we will call you to review the results.   Testing/Procedures: None ordered   Follow-Up: At [facility], you and your health needs are our priority.  As part of our continuing mission to provide you with exceptional heart care, we have created designated Provider Care Teams.  These Care Teams include your primary Cardiologist (physician) and Advanced Practice Providers (APPs -  Physician Assistants and Nurse Practitioners) who all work together to provide you with the care you need, when you need it.  We recommend signing up for the patient portal called "MyChart".  Sign up information is provided on this After Visit Summary.  MyChart is used to connect with patients for Virtual Visits (Telemedicine).  Patients are able to view lab/test results, encounter notes, upcoming appointments, etc.  Non-urgent messages can be sent to your provider as well.   To learn more about what you can do with MyChart, go to [email or web address].    Your next appointment:   12 month(s)  The format for your next appointment:   In Person  Provider:   You may see Sanda Klein, MD or one of the following Advanced Practice Providers on your designated Care Team:    Almyra Deforest, PA-C  Fabian Sharp, PA-C or   Roby Lofts, [place]

## 2020-06-17 DIAGNOSIS — K9041 Non-celiac gluten sensitivity: Secondary | ICD-10-CM | POA: Diagnosis not present

## 2020-06-17 DIAGNOSIS — Z800 Family history of malignant neoplasm of digestive organs: Secondary | ICD-10-CM | POA: Diagnosis not present

## 2020-06-17 DIAGNOSIS — N281 Cyst of kidney, acquired: Secondary | ICD-10-CM | POA: Diagnosis not present

## 2020-06-17 DIAGNOSIS — R350 Frequency of micturition: Secondary | ICD-10-CM | POA: Diagnosis not present

## 2020-06-17 DIAGNOSIS — K5904 Chronic idiopathic constipation: Secondary | ICD-10-CM | POA: Diagnosis not present

## 2020-06-17 DIAGNOSIS — K219 Gastro-esophageal reflux disease without esophagitis: Secondary | ICD-10-CM | POA: Diagnosis not present

## 2020-07-24 IMAGING — MG DIGITAL SCREENING BILATERAL MAMMOGRAM WITH TOMO AND CAD
8 of 15 series · 8 of 40 positions shown · non-contrast
Comparison: Previous exam(s).

CLINICAL DATA: Screening. Prior malignant lumpectomy of the RIGHT
breast in 4828.

EXAM:
DIGITAL SCREENING BILATERAL MAMMOGRAM WITH TOMO AND CAD

[L CC synth-2D]
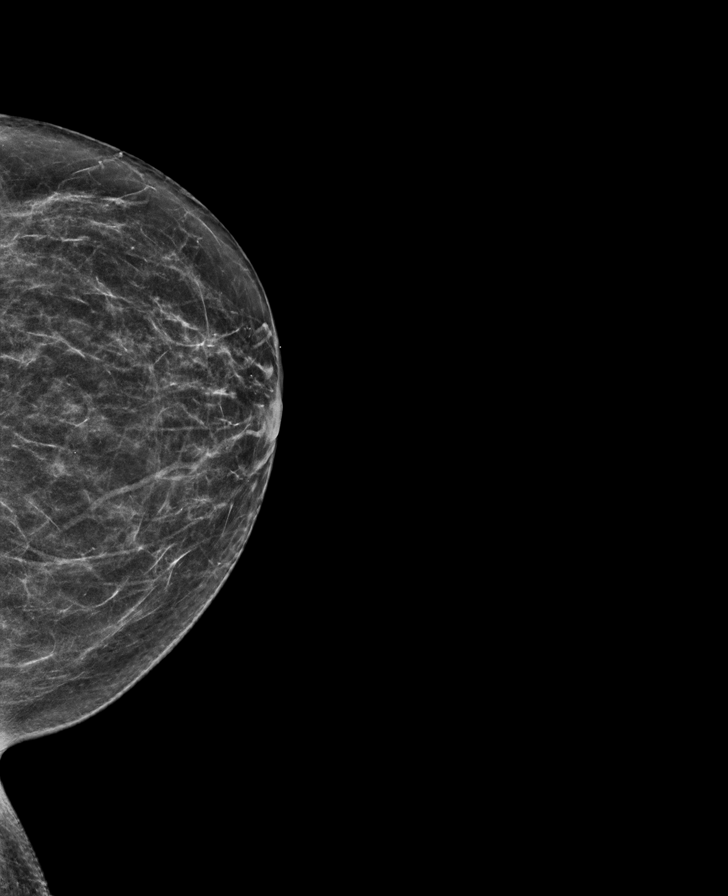

[R MLO synth-2D (1 of 2)]
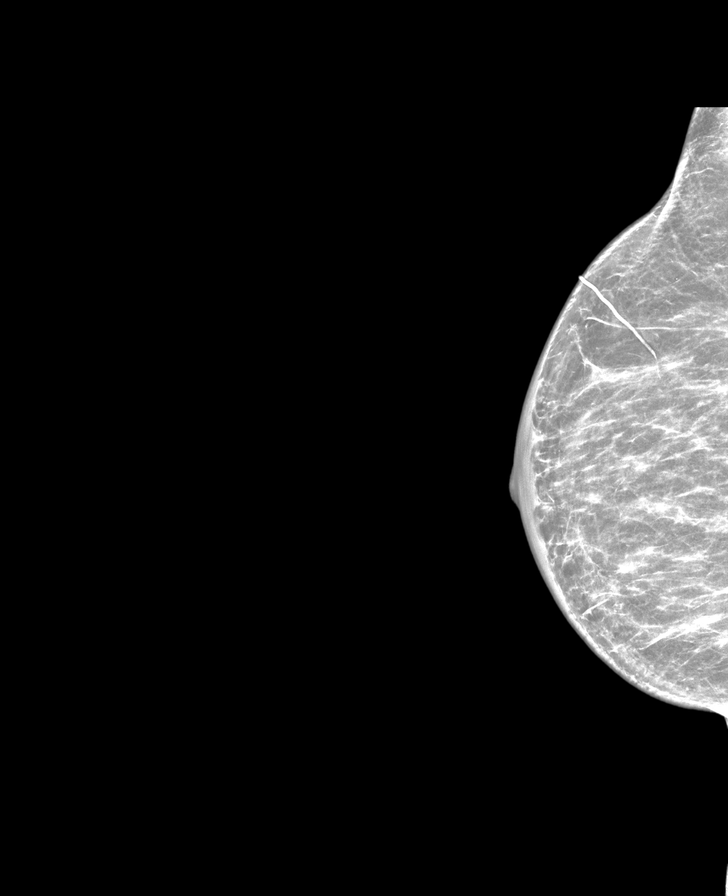

[R MLO synth-2D (2 of 2)]
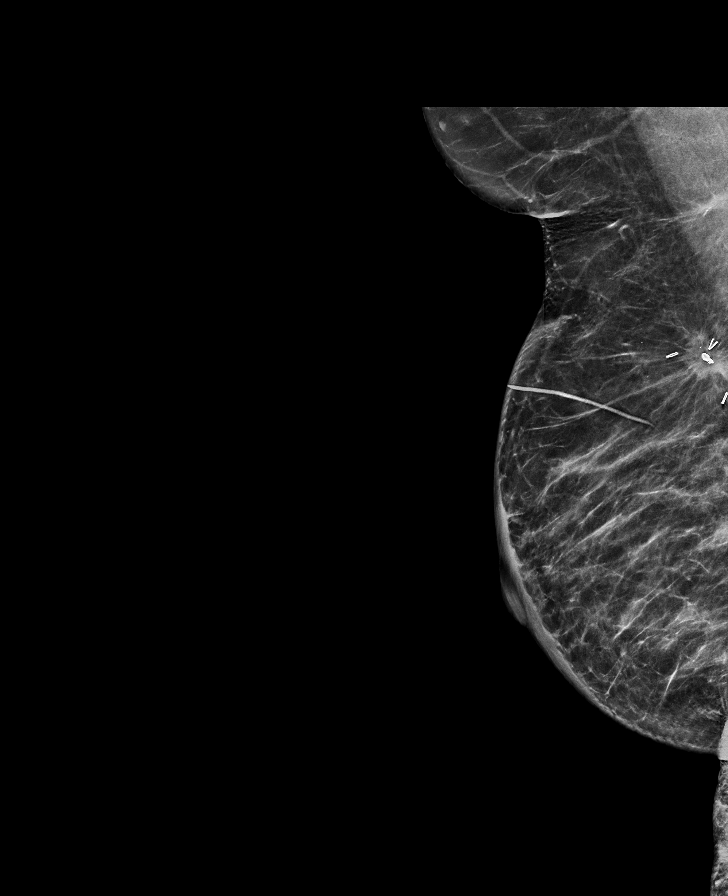

[L XCCL synth-2D]
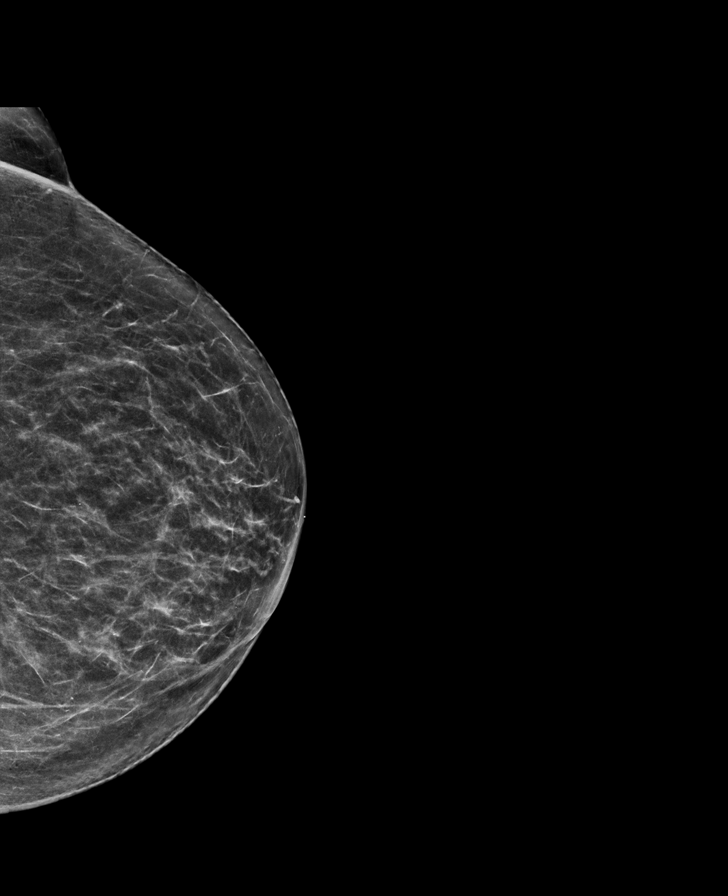

[R XCCL synth-2D]
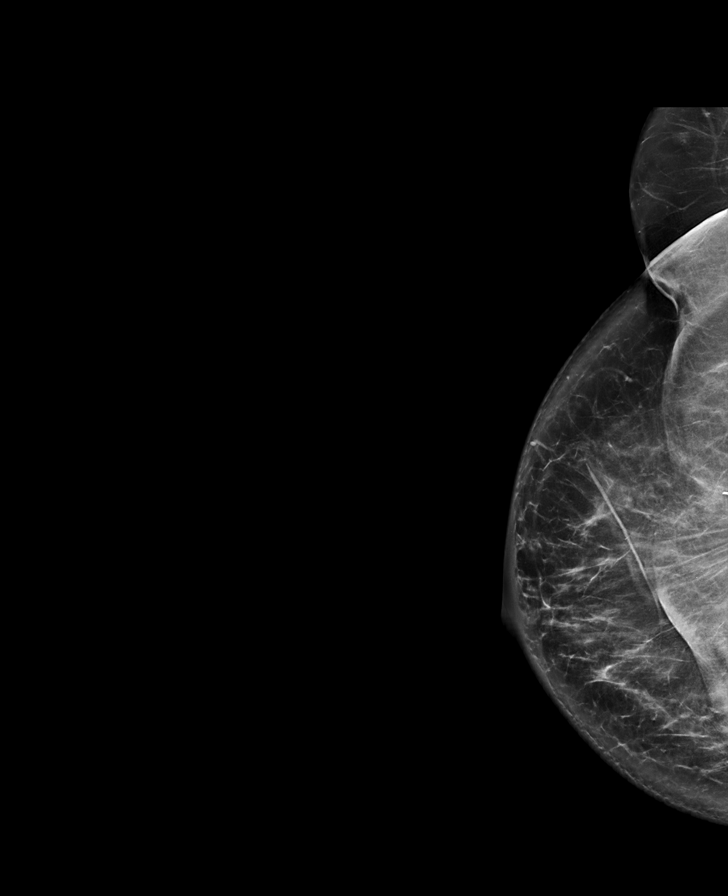

[L MLO synth-2D]
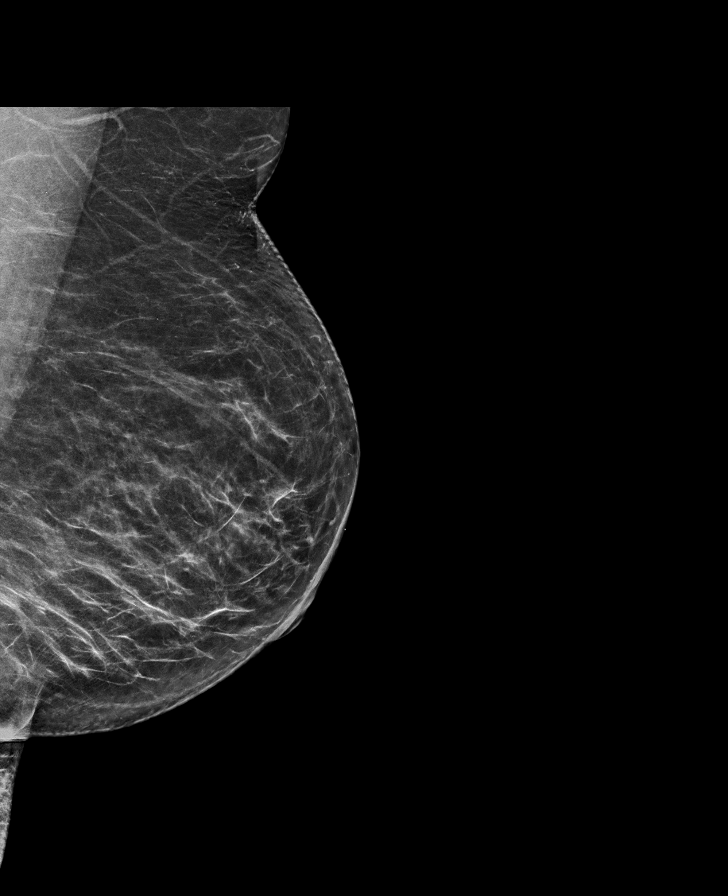

[R CC synth-2D]
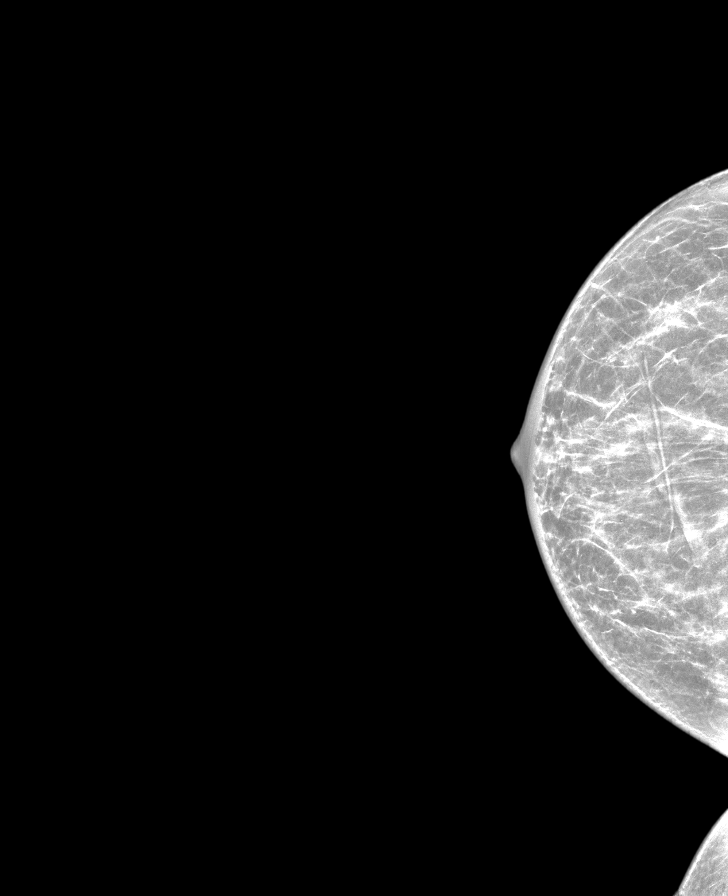

[R MLO tomo · tomo slice 47/69.0]
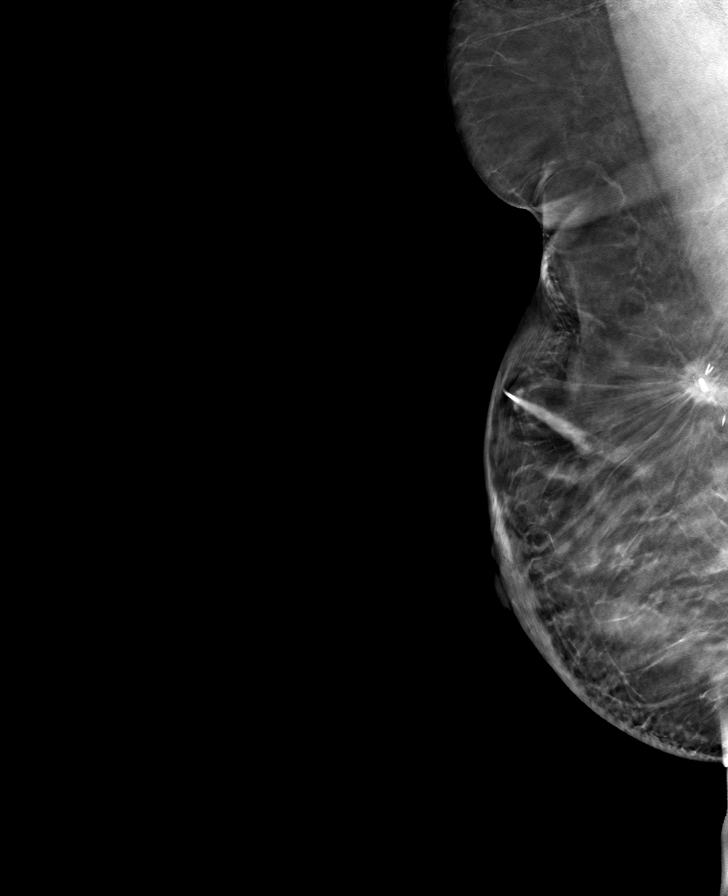

[8 of 40 positions shown; findings below may reference images not displayed]

ACR Breast Density Category b: There are scattered areas of
fibroglandular density.
FINDINGS: There are no findings suspicious for malignancy. Stable post
lumpectomy and post radiation changes involving the RIGHT breast.
Images were processed with CAD.
IMPRESSION: No mammographic evidence of malignancy. A result letter of this
screening mammogram will be mailed directly to the patient.

RECOMMENDATION:
Screening mammogram in one year. (Code:KV-X-WRA)

BI-RADS CATEGORY  2: Benign.

## 2020-08-14 DIAGNOSIS — E890 Postprocedural hypothyroidism: Secondary | ICD-10-CM | POA: Diagnosis not present

## 2020-08-20 ENCOUNTER — Ambulatory Visit: Payer: BC Managed Care – PPO | Admitting: Cardiovascular Disease

## 2020-09-16 DIAGNOSIS — E039 Hypothyroidism, unspecified: Secondary | ICD-10-CM | POA: Diagnosis not present

## 2020-10-03 DIAGNOSIS — E039 Hypothyroidism, unspecified: Secondary | ICD-10-CM | POA: Diagnosis not present

## 2020-10-14 DIAGNOSIS — Z01419 Encounter for gynecological examination (general) (routine) without abnormal findings: Secondary | ICD-10-CM | POA: Diagnosis not present

## 2020-10-14 DIAGNOSIS — Z6836 Body mass index (BMI) 36.0-36.9, adult: Secondary | ICD-10-CM | POA: Diagnosis not present

## 2020-10-23 DIAGNOSIS — E039 Hypothyroidism, unspecified: Secondary | ICD-10-CM | POA: Diagnosis not present

## 2020-11-11 DIAGNOSIS — I10 Essential (primary) hypertension: Secondary | ICD-10-CM | POA: Diagnosis not present

## 2020-11-11 DIAGNOSIS — E785 Hyperlipidemia, unspecified: Secondary | ICD-10-CM | POA: Diagnosis not present

## 2020-11-18 DIAGNOSIS — Z23 Encounter for immunization: Secondary | ICD-10-CM | POA: Diagnosis not present

## 2020-11-18 DIAGNOSIS — Z0000 Encounter for general adult medical examination without abnormal findings: Secondary | ICD-10-CM | POA: Diagnosis not present

## 2020-11-19 DIAGNOSIS — E785 Hyperlipidemia, unspecified: Secondary | ICD-10-CM | POA: Diagnosis not present

## 2020-12-02 DIAGNOSIS — E559 Vitamin D deficiency, unspecified: Secondary | ICD-10-CM | POA: Diagnosis not present

## 2020-12-02 DIAGNOSIS — E785 Hyperlipidemia, unspecified: Secondary | ICD-10-CM | POA: Diagnosis not present

## 2020-12-02 DIAGNOSIS — I10 Essential (primary) hypertension: Secondary | ICD-10-CM | POA: Diagnosis not present

## 2020-12-03 ENCOUNTER — Other Ambulatory Visit: Payer: Self-pay | Admitting: Obstetrics and Gynecology

## 2020-12-03 DIAGNOSIS — Z1231 Encounter for screening mammogram for malignant neoplasm of breast: Secondary | ICD-10-CM

## 2021-02-10 ENCOUNTER — Ambulatory Visit
Admission: RE | Admit: 2021-02-10 | Discharge: 2021-02-10 | Disposition: A | Discharge status: Home / Self Care | Payer: BC Managed Care – PPO | Source: Ambulatory Visit | Attending: Obstetrics and Gynecology | Admitting: Obstetrics and Gynecology

## 2021-02-10 ENCOUNTER — Other Ambulatory Visit: Payer: Self-pay

## 2021-02-10 DIAGNOSIS — Z85828 Personal history of other malignant neoplasm of skin: Secondary | ICD-10-CM | POA: Diagnosis not present

## 2021-02-10 DIAGNOSIS — L821 Other seborrheic keratosis: Secondary | ICD-10-CM | POA: Diagnosis not present

## 2021-02-10 DIAGNOSIS — Z1231 Encounter for screening mammogram for malignant neoplasm of breast: Secondary | ICD-10-CM

## 2021-02-10 DIAGNOSIS — D2261 Melanocytic nevi of right upper limb, including shoulder: Secondary | ICD-10-CM | POA: Diagnosis not present

## 2021-02-10 DIAGNOSIS — D2262 Melanocytic nevi of left upper limb, including shoulder: Secondary | ICD-10-CM | POA: Diagnosis not present

## 2021-02-10 DIAGNOSIS — D485 Neoplasm of uncertain behavior of skin: Secondary | ICD-10-CM | POA: Diagnosis not present

## 2021-02-10 DIAGNOSIS — D225 Melanocytic nevi of trunk: Secondary | ICD-10-CM | POA: Diagnosis not present

## 2021-03-03 DIAGNOSIS — E559 Vitamin D deficiency, unspecified: Secondary | ICD-10-CM | POA: Diagnosis not present

## 2021-03-04 DIAGNOSIS — D485 Neoplasm of uncertain behavior of skin: Secondary | ICD-10-CM | POA: Diagnosis not present

## 2021-03-04 DIAGNOSIS — L988 Other specified disorders of the skin and subcutaneous tissue: Secondary | ICD-10-CM | POA: Diagnosis not present

## 2021-05-19 DIAGNOSIS — F4323 Adjustment disorder with mixed anxiety and depressed mood: Secondary | ICD-10-CM | POA: Diagnosis not present

## 2021-05-19 DIAGNOSIS — E890 Postprocedural hypothyroidism: Secondary | ICD-10-CM | POA: Diagnosis not present

## 2021-05-26 DIAGNOSIS — M8580 Other specified disorders of bone density and structure, unspecified site: Secondary | ICD-10-CM | POA: Diagnosis not present

## 2021-05-26 DIAGNOSIS — E890 Postprocedural hypothyroidism: Secondary | ICD-10-CM | POA: Diagnosis not present

## 2021-05-26 DIAGNOSIS — E559 Vitamin D deficiency, unspecified: Secondary | ICD-10-CM | POA: Diagnosis not present

## 2021-05-26 DIAGNOSIS — M8589 Other specified disorders of bone density and structure, multiple sites: Secondary | ICD-10-CM | POA: Diagnosis not present

## 2021-05-26 DIAGNOSIS — F4323 Adjustment disorder with mixed anxiety and depressed mood: Secondary | ICD-10-CM | POA: Diagnosis not present

## 2021-05-26 DIAGNOSIS — I10 Essential (primary) hypertension: Secondary | ICD-10-CM | POA: Diagnosis not present

## 2021-06-02 DIAGNOSIS — F4323 Adjustment disorder with mixed anxiety and depressed mood: Secondary | ICD-10-CM | POA: Diagnosis not present

## 2021-06-16 DIAGNOSIS — F4323 Adjustment disorder with mixed anxiety and depressed mood: Secondary | ICD-10-CM | POA: Diagnosis not present

## 2021-06-23 DIAGNOSIS — E890 Postprocedural hypothyroidism: Secondary | ICD-10-CM | POA: Diagnosis not present

## 2021-06-30 DIAGNOSIS — F4323 Adjustment disorder with mixed anxiety and depressed mood: Secondary | ICD-10-CM | POA: Diagnosis not present

## 2021-09-01 DIAGNOSIS — F4323 Adjustment disorder with mixed anxiety and depressed mood: Secondary | ICD-10-CM | POA: Diagnosis not present

## 2021-09-03 DIAGNOSIS — N898 Other specified noninflammatory disorders of vagina: Secondary | ICD-10-CM | POA: Diagnosis not present

## 2021-09-03 DIAGNOSIS — N949 Unspecified condition associated with female genital organs and menstrual cycle: Secondary | ICD-10-CM | POA: Diagnosis not present

## 2021-09-10 DIAGNOSIS — K625 Hemorrhage of anus and rectum: Secondary | ICD-10-CM | POA: Diagnosis not present

## 2021-09-10 DIAGNOSIS — K219 Gastro-esophageal reflux disease without esophagitis: Secondary | ICD-10-CM | POA: Diagnosis not present

## 2021-09-10 DIAGNOSIS — K9041 Non-celiac gluten sensitivity: Secondary | ICD-10-CM | POA: Diagnosis not present

## 2021-09-10 DIAGNOSIS — K5900 Constipation, unspecified: Secondary | ICD-10-CM | POA: Diagnosis not present

## 2021-09-10 DIAGNOSIS — K600 Acute anal fissure: Secondary | ICD-10-CM | POA: Diagnosis not present

## 2021-09-14 DIAGNOSIS — I10 Essential (primary) hypertension: Secondary | ICD-10-CM | POA: Diagnosis not present

## 2021-09-14 DIAGNOSIS — R0982 Postnasal drip: Secondary | ICD-10-CM | POA: Diagnosis not present

## 2021-09-22 DIAGNOSIS — F4323 Adjustment disorder with mixed anxiety and depressed mood: Secondary | ICD-10-CM | POA: Diagnosis not present

## 2021-09-28 DIAGNOSIS — N760 Acute vaginitis: Secondary | ICD-10-CM | POA: Diagnosis not present

## 2021-10-20 DIAGNOSIS — N760 Acute vaginitis: Secondary | ICD-10-CM | POA: Diagnosis not present

## 2021-10-20 DIAGNOSIS — Z01419 Encounter for gynecological examination (general) (routine) without abnormal findings: Secondary | ICD-10-CM | POA: Diagnosis not present

## 2021-10-20 DIAGNOSIS — Z124 Encounter for screening for malignant neoplasm of cervix: Secondary | ICD-10-CM | POA: Diagnosis not present

## 2021-10-27 DIAGNOSIS — F4323 Adjustment disorder with mixed anxiety and depressed mood: Secondary | ICD-10-CM | POA: Diagnosis not present

## 2021-10-28 DIAGNOSIS — E890 Postprocedural hypothyroidism: Secondary | ICD-10-CM | POA: Diagnosis not present

## 2021-11-10 DIAGNOSIS — F4323 Adjustment disorder with mixed anxiety and depressed mood: Secondary | ICD-10-CM | POA: Diagnosis not present

## 2021-11-17 DIAGNOSIS — Z0000 Encounter for general adult medical examination without abnormal findings: Secondary | ICD-10-CM | POA: Diagnosis not present

## 2021-11-24 DIAGNOSIS — Z0000 Encounter for general adult medical examination without abnormal findings: Secondary | ICD-10-CM | POA: Diagnosis not present

## 2021-11-24 DIAGNOSIS — K2270 Barrett's esophagus without dysplasia: Secondary | ICD-10-CM | POA: Diagnosis not present

## 2021-11-24 DIAGNOSIS — E890 Postprocedural hypothyroidism: Secondary | ICD-10-CM | POA: Diagnosis not present

## 2021-11-24 DIAGNOSIS — Z23 Encounter for immunization: Secondary | ICD-10-CM | POA: Diagnosis not present

## 2021-11-24 DIAGNOSIS — E785 Hyperlipidemia, unspecified: Secondary | ICD-10-CM | POA: Diagnosis not present

## 2021-11-24 DIAGNOSIS — K219 Gastro-esophageal reflux disease without esophagitis: Secondary | ICD-10-CM | POA: Diagnosis not present

## 2021-11-25 ENCOUNTER — Ambulatory Visit (INDEPENDENT_AMBULATORY_CARE_PROVIDER_SITE_OTHER): Payer: BC Managed Care – PPO | Admitting: Cardiovascular Disease

## 2021-11-25 ENCOUNTER — Encounter: Payer: Self-pay | Admitting: Cardiovascular Disease

## 2021-11-25 VITALS — BP 138/88 | HR 67 | Ht 64.0 in | Wt 217.8 lb

## 2021-11-25 DIAGNOSIS — I10 Essential (primary) hypertension: Secondary | ICD-10-CM

## 2021-11-25 DIAGNOSIS — R002 Palpitations: Secondary | ICD-10-CM | POA: Diagnosis not present

## 2021-11-25 DIAGNOSIS — I872 Venous insufficiency (chronic) (peripheral): Secondary | ICD-10-CM

## 2021-11-25 DIAGNOSIS — Z6837 Body mass index (BMI) 37.0-37.9, adult: Secondary | ICD-10-CM

## 2021-11-25 DIAGNOSIS — E7800 Pure hypercholesterolemia, unspecified: Secondary | ICD-10-CM | POA: Diagnosis not present

## 2021-11-25 NOTE — Patient Instructions (Signed)
Medication Instructions:  ?No chnages ?*If you need a refill on your cardiac medications before your next appointment, please call your pharmacy* ? ? ?Lab Work: ?None ordered ?If you have labs (blood work) drawn today and your tests are completely normal, you will receive your results only by: ?MyChart Message (if you have MyChart) OR ?A paper copy in the mail ?If you have any lab test that is abnormal or we need to change your treatment, we will call you to review the results. ? ? ?Testing/Procedures: ?None ordered ? ? ?Follow-Up: ?At [facility], you and your health needs are our priority.  As part of our continuing mission to provide you with exceptional heart care, we have created designated Provider Care Teams.  These Care Teams include your primary Cardiologist (physician) and Advanced Practice Providers (APPs -  Physician Assistants and Nurse Practitioners) who all work together to provide you with the care you need, when you need it. ? ?We recommend signing up for the patient portal called "MyChart".  Sign up information is provided on this After Visit Summary.  MyChart is used to connect with patients for Virtual Visits (Telemedicine).  Patients are able to view lab/test results, encounter notes, upcoming appointments, etc.  Non-urgent messages can be sent to your provider as well.   ?To learn more about what you can do with MyChart, go to [email or web address].   ? ?Your next appointment:   ?12 month(s) ? ?The format for your next appointment:   ?In Person ? ?Provider:   ?Sanda Klein, MD { ? ?Important Information About Sugar ? ? ? ? ? ? ?

## 2021-11-25 NOTE — Progress Notes (Signed)
? ?Cardiology office note  ? ? ?Date:  11/29/2021  ? ?ID:  Elizabeth Berg, DOB May 09, 1951, MRN [id number] ?PCP:  Deland Pretty, MD  ?Cardiologist:  Sanda Klein, MD  ?Electrophysiologist:  None  ? ?Chief Complaint:  Palpitations ? ?History of Present Illness:   ? ?Elizabeth Berg is a 71 y.o. 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  Her husband Elizabeth Berg is also my patient and their son [name] has been referred to me. ? ?She had a markedly increased burden of palpitations in the last several months, but subsequently realized that this was due to increased emotional stress related to one of her children.  As she has felt more relaxed, the palpitations have decreased in severity.  She currently feels pretty well.  She is taking both verapamil and metoprolol in low doses. ? ?The patient specifically denies any chest pain at rest exertion, dyspnea at rest or with exertion, orthopnea, paroxysmal nocturnal dyspnea, syncope, focal neurological deficits, intermittent claudication, lower extremity edema, unexplained weight gain, cough, hemoptysis or wheezing.  ? ?She had a normal nuclear stress test in January 2019.  She had a normal echocardiogram in 2017 (only mild diastolic dysfunction, otherwise normal).  She does not have nodes your PAD.  Her most recent lipid profile shows an elevated LDL of around 117.  She is on rosuvastatin intermittently, due to problems with myopathy.  She has recently increase it to 4 days a week and seems to be tolerating that. ? ? ?Past Medical History:  ?Diagnosis Date  ? Breast cancer ([place])   ? 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 GERD (gastroesophageal reflux disease)   ? H/O bone density study   ? H/O colonoscopy 04/14/09  ? Heart burn   ? Hypercholesterolemia   ? Hypertension   ? Palpitations   ? Personal history of radiation therapy   ? right breast 2013  ? Wears  glasses   ? ?Past Surgical History:  ?Procedure Laterality Date  ? bone density study    ? BREAST BIOPSY  11/22/2011  ? right breast biopsy  ? BREAST LUMPECTOMY  12/20/2011  ? right breast lumpectomy and right axillary lymph node biopsy  ? CHOLECYSTECTOMY    ? COLONOSCOPY    ? THYROID SURGERY    ? TOTAL THYROIDECTOMY  2008  ? Dr. Harlow Asa  ?  ? ?Current Meds  ?Medication Sig  ? levothyroxine (SYNTHROID, LEVOTHROID) 137 MCG tablet Take 137 mcg by mouth daily.  ? lisinopril (PRINIVIL,ZESTRIL) 10 MG tablet Take 10 mg by mouth daily.   ? metoprolol succinate (TOPROL-XL) 25 MG 24 hr tablet TAKE 1/2 TABLET BY MOUTH ONCE DAILY  ? omeprazole (PRILOSEC) 40 MG capsule Take 40 mg by mouth 4 (four) times a week.   ? rosuvastatin (CRESTOR) 10 MG tablet Take 1 tablet (10 mg total) by mouth 3 (three) times a week.  ? verapamil (VERELAN PM) 120 MG 24 hr capsule Take 120 mg by mouth at bedtime.   ?  ? ?Allergies:   Penicillins  ? ?Social History  ? ?Tobacco Use  ? Smoking status: Never  ? Smokeless tobacco: Never  ?Substance Use Topics  ? Alcohol use: No  ? Drug use: No  ?  ? ?Family Hx: ?The patient's family history includes Cancer in her mother; Cancer (age of onset: 31) in her brother; Esophageal cancer in her mother; Rectal cancer in her brother. ? ?ROS:   ?  Please see the history of present illness.    ? ?All other systems reviewed and are negative. ? ? ?Prior CV studies:   ?The following studies were reviewed today: ? ?Nuclear stress test 2019 ? ?Labs/Other Tests and Data Reviewed:   ? ?EKG: Ordered today and personally reviewed shows normal sinus rhythm, normal tracing ?Recent Labs: ?11/11/2020 ?Creatinine 0.95, ALT 15 ? ?06/23/2021 ?TSH 1.67 ? ?Recent Lipid Panel ?Lipid Panel  ?No results found for: CHOL, TRIG, HDL, CHOLHDL, VLDL, LDLCALC, LDLDIRECT, LABVLDL ? ?11/17/2021 ?Cholesterol 176, HDL 49, LDL 117, triglycerides 85 ? ?Wt Readings from Last 3 Encounters:  ?11/25/21 217 lb 12.8 oz (98.8 kg)  ?06/11/20 205 lb 12.8 oz (93.4 kg)   ?07/26/19 214 lb (97.1 kg)  ?  ? ?Objective:   ? ?Vital Signs:  BP 138/88   Pulse 67   Ht '5\' 4"'$  (1.626 m)   Wt 217 lb 12.8 oz (98.8 kg)   LMP 12/20/2006   SpO2 97%   BMI 37.39 kg/m?   ? ? ? ?General: Alert, oriented x3, no distress, severely obese ?Head: no evidence of trauma, PERRL, EOMI, no exophtalmos or lid lag, no myxedema, no xanthelasma; normal ears, nose and oropharynx ?Neck: normal jugular venous pulsations and no hepatojugular reflux; brisk carotid pulses without delay and no carotid bruits ?Chest: clear to auscultation, no signs of consolidation by percussion or palpation, normal fremitus, symmetrical and full respiratory excursions ?Cardiovascular: normal position and quality of the apical impulse, regular rhythm, normal first and second heart sounds, no murmurs, rubs or gallops ?Abdomen: no tenderness or distention, no masses by palpation, no abnormal pulsatility or arterial bruits, normal bowel sounds, no hepatosplenomegaly ?Extremities: no clubbing, cyanosis or edema; 2+ radial, ulnar and brachial pulses bilaterally; 2+ right femoral, posterior tibial and dorsalis pedis pulses; 2+ left femoral, posterior tibial and dorsalis pedis pulses; no subclavian or femoral bruits ?Neurological: grossly nonfocal ?Psych: Normal mood and affect ? ? ? ?ASSESSMENT & PLAN:   ? ? ?1. Palpitations   ?2. Peripheral venous insufficiency   ?3. Class 2 severe obesity due to excess calories with serious comorbidity and body mass index (BMI) of 37.0 to 37.9 in adult [facility])   ?4. Hypercholesterolemia   ?5. Essential hypertension   ? ? ? ?Palpitations: Her palpitations are very much adrenergically driven.  She does not have significant structural heart disease.  The complaints seem to have subsided.  Continue verapamil and metoprolol.  Okay to take an extra small dose of metoprolol succinate (increased 25 mg instead of 12.5 mg daily) as needed. ?Peripheral venous insufficiency: No problem with edema at this  time. ?Obesity/prediabetes: Unfortunately, she has gained back the weight that she had lost.  She is now severely obese range with a BMI over 37. ?HLP: Did not tolerate rosuvastatin taking on a daily basis, but is tolerating 3 days a week.  We will increase this to 4 days a week to try to get the LDL less than 100. ?HTN: Fair control.  Borderline elevated systolic blood pressure today on arrival, improved after she relaxed. ? ?Patient Instructions  ?Medication Instructions:  ?No chnages ?*If you need a refill on your cardiac medications before your next appointment, please call your pharmacy* ? ? ?Lab Work: ?None ordered ?If you have labs (blood work) drawn today and your tests are completely normal, you will receive your results only by: ?MyChart Message (if you have MyChart) OR ?A paper copy in the mail ?If you have any lab test that is abnormal  or we need to change your treatment, we will call you to review the results. ? ? ?Testing/Procedures: ?None ordered ? ? ?Follow-Up: ?At [facility], you and your health needs are our priority.  As part of our continuing mission to provide you with exceptional heart care, we have created designated Provider Care Teams.  These Care Teams include your primary Cardiologist (physician) and Advanced Practice Providers (APPs -  Physician Assistants and Nurse Practitioners) who all work together to provide you with the care you need, when you need it. ? ?We recommend signing up for the patient portal called "MyChart".  Sign up information is provided on this After Visit Summary.  MyChart is used to connect with patients for Virtual Visits (Telemedicine).  Patients are able to view lab/test results, encounter notes, upcoming appointments, etc.  Non-urgent messages can be sent to your provider as well.   ?To learn more about what you can do with MyChart, go to [email or web address].   ? ?Your next appointment:   ?12 month(s) ? ?The format for your next appointment:   ?In  Person ? ?Provider:   ?Sanda Klein, MD { ? ?Important Information About Sugar ? ? ? ? ? ? ?Disposition:  Follow up  12 months ? ?Signed, ?Sanda Klein, MD  ?11/29/2021 6:36 PM    ?[place] ?

## 2021-11-29 ENCOUNTER — Encounter: Payer: Self-pay | Admitting: Cardiovascular Disease

## 2021-12-01 DIAGNOSIS — F4323 Adjustment disorder with mixed anxiety and depressed mood: Secondary | ICD-10-CM | POA: Diagnosis not present

## 2022-01-04 ENCOUNTER — Other Ambulatory Visit: Payer: Self-pay | Admitting: Obstetrics and Gynecology

## 2022-01-04 DIAGNOSIS — Z1231 Encounter for screening mammogram for malignant neoplasm of breast: Secondary | ICD-10-CM

## 2022-01-12 DIAGNOSIS — F4323 Adjustment disorder with mixed anxiety and depressed mood: Secondary | ICD-10-CM | POA: Diagnosis not present

## 2022-02-02 DIAGNOSIS — F4323 Adjustment disorder with mixed anxiety and depressed mood: Secondary | ICD-10-CM | POA: Diagnosis not present

## 2022-02-16 ENCOUNTER — Ambulatory Visit
Admission: RE | Admit: 2022-02-16 | Discharge: 2022-02-16 | Disposition: A | Discharge status: Home / Self Care | Payer: BC Managed Care – PPO | Source: Ambulatory Visit | Attending: Obstetrics and Gynecology | Admitting: Obstetrics and Gynecology

## 2022-02-16 DIAGNOSIS — D225 Melanocytic nevi of trunk: Secondary | ICD-10-CM | POA: Diagnosis not present

## 2022-02-16 DIAGNOSIS — L570 Actinic keratosis: Secondary | ICD-10-CM | POA: Diagnosis not present

## 2022-02-16 DIAGNOSIS — L814 Other melanin hyperpigmentation: Secondary | ICD-10-CM | POA: Diagnosis not present

## 2022-02-16 DIAGNOSIS — L718 Other rosacea: Secondary | ICD-10-CM | POA: Diagnosis not present

## 2022-02-16 DIAGNOSIS — Z1231 Encounter for screening mammogram for malignant neoplasm of breast: Secondary | ICD-10-CM

## 2022-02-16 DIAGNOSIS — Z85828 Personal history of other malignant neoplasm of skin: Secondary | ICD-10-CM | POA: Diagnosis not present

## 2022-02-16 DIAGNOSIS — L820 Inflamed seborrheic keratosis: Secondary | ICD-10-CM | POA: Diagnosis not present

## 2022-03-30 DIAGNOSIS — F4323 Adjustment disorder with mixed anxiety and depressed mood: Secondary | ICD-10-CM | POA: Diagnosis not present

## 2022-04-27 DIAGNOSIS — F4323 Adjustment disorder with mixed anxiety and depressed mood: Secondary | ICD-10-CM | POA: Diagnosis not present

## 2022-05-25 DIAGNOSIS — E890 Postprocedural hypothyroidism: Secondary | ICD-10-CM | POA: Diagnosis not present

## 2022-05-25 DIAGNOSIS — F4323 Adjustment disorder with mixed anxiety and depressed mood: Secondary | ICD-10-CM | POA: Diagnosis not present

## 2022-06-01 DIAGNOSIS — E890 Postprocedural hypothyroidism: Secondary | ICD-10-CM | POA: Diagnosis not present

## 2022-06-29 DIAGNOSIS — I10 Essential (primary) hypertension: Secondary | ICD-10-CM | POA: Diagnosis not present

## 2022-06-29 DIAGNOSIS — E890 Postprocedural hypothyroidism: Secondary | ICD-10-CM | POA: Diagnosis not present

## 2022-06-29 DIAGNOSIS — F4323 Adjustment disorder with mixed anxiety and depressed mood: Secondary | ICD-10-CM | POA: Diagnosis not present

## 2022-06-29 DIAGNOSIS — E785 Hyperlipidemia, unspecified: Secondary | ICD-10-CM | POA: Diagnosis not present

## 2022-07-27 DIAGNOSIS — F4323 Adjustment disorder with mixed anxiety and depressed mood: Secondary | ICD-10-CM | POA: Diagnosis not present

## 2022-07-30 ENCOUNTER — Telehealth: Payer: Self-pay | Admitting: Cardiovascular Disease

## 2022-07-30 NOTE — Telephone Encounter (Signed)
Patient c/o Palpitations:  High priority if patient c/o lightheadedness, shortness of breath, or chest pain  How long have you had palpitations/irregular HR/ Afib? Palpitations have been going on for a few months, but it has been getting worse lately. She feels more skipping beats and her heart beating hard. Are you having the symptoms now? It did it earlier today.   Are you currently experiencing lightheadedness, SOB or CP? No.  She said she got lightheaded last week, but not usually.   Do you have a history of afib (atrial fibrillation) or irregular heart rhythm? Has history of palpitations.  Have you checked your BP or HR? (document readings if available): doesn't have a reading she can give me, but states they are good when she has checked in the past.   Are you experiencing any other symptoms? No

## 2022-07-30 NOTE — Telephone Encounter (Signed)
OK to increase the metoprolol to a full 25 mg tablet once daily until her follow up appt.

## 2022-07-30 NOTE — Telephone Encounter (Signed)
Spoke with patient to inform her to take met succ '25mg'$  daily until f/u appointment per Dr. Sallyanne Kuster and to stay hydrated, avoid caffeine, avoid ETOH, and try to manage her stress. Patient verbalized understanding and thanked everyone.

## 2022-07-30 NOTE — Telephone Encounter (Addendum)
Patient stated that over the past month, her episodes of palpitations has increased. She denies SOB and chest pain, but will have dizziness. BP while on phone 147/89, P 89. Not having palpitations at this time. She started to cry briefly while on phone and stated she does get stressed. She is staying hydrated. Blood work from [employer] on 06/29/22 WNL. She stated she wants the palpitations to be more tolerable. Appointment made with Vella Raring for 1/16.

## 2022-07-30 NOTE — Telephone Encounter (Signed)
Will address at clinic visit.   Per Dr. Sallyanne Kuster last note "Okay to take an extra small dose of metoprolol succinate (increased 25 mg instead of 12.5 mg daily) as needed. "  Prevent palpitations by staying well hydrated, avoiding caffeine, avoiding alcohol, managing stress well.   Elizabeth Berg - will you please make her aware of these recommendations? Thank you!  Loel Dubonnet, NP

## 2022-08-03 ENCOUNTER — Other Ambulatory Visit (INDEPENDENT_AMBULATORY_CARE_PROVIDER_SITE_OTHER): Payer: BC Managed Care – PPO

## 2022-08-03 ENCOUNTER — Ambulatory Visit (INDEPENDENT_AMBULATORY_CARE_PROVIDER_SITE_OTHER): Payer: BC Managed Care – PPO | Admitting: Family

## 2022-08-03 ENCOUNTER — Other Ambulatory Visit (HOSPITAL_BASED_OUTPATIENT_CLINIC_OR_DEPARTMENT_OTHER): Payer: Self-pay | Admitting: Family

## 2022-08-03 ENCOUNTER — Encounter (HOSPITAL_BASED_OUTPATIENT_CLINIC_OR_DEPARTMENT_OTHER): Payer: Self-pay | Admitting: Family

## 2022-08-03 VITALS — BP 136/84 | HR 59 | Ht 64.0 in | Wt 226.0 lb

## 2022-08-03 DIAGNOSIS — I10 Essential (primary) hypertension: Secondary | ICD-10-CM | POA: Diagnosis not present

## 2022-08-03 DIAGNOSIS — R002 Palpitations: Secondary | ICD-10-CM

## 2022-08-03 DIAGNOSIS — E782 Mixed hyperlipidemia: Secondary | ICD-10-CM

## 2022-08-03 DIAGNOSIS — E039 Hypothyroidism, unspecified: Secondary | ICD-10-CM | POA: Diagnosis not present

## 2022-08-03 MED ORDER — METOPROLOL SUCCINATE ER 25 MG PO TB24: 25.0000 mg | ORAL_TABLET | Freq: Every day | ORAL | 3 refills | Status: DC

## 2022-08-03 NOTE — Progress Notes (Signed)
Office Visit    Patient Name: Elizabeth Berg Date of Encounter: 08/03/2022  PCP:  Deland Pretty, [facility]  Cardiologist:  Sanda Klein, MD  Advanced Practice Provider:  No care team member to display Electrophysiologist:  None      Chief Complaint    Elizabeth Berg is a 72 y.o. female presents today for palpitations   Past Medical History    Past Medical History:  Diagnosis Date   Breast cancer (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 Harlow Asa    Allergies  Allergies  Allergen Reactions   Penicillins     Childhood allergy; reaction unknown    History of Present Illness    Elizabeth Berg is a 72 y.o. female with a hx of hypothyroidism, palpitations, peripheral venous insufficiency, obesity, HLD last seen 11/25/21 by Dr. Sallyanne Kuster.  Prior cardiac workup includes normal nuclear stress test January 2019.  Normal echocardiogram in 2017 (only mild diastolic dysfunction, otherwise normal).  Previously had myopathy with more frequent doses of rosuvastatin but able to tolerate 4 days a week.  Longstanding history of palpitations with prior monitor 06/2017 with symptomatic episodes of nonsustained atrial tachycardia as well as frequent PACs.  She last saw Dr. Sallyanne Kuster in May 2023 doing well from a cardiac perspective with palpitations  well-controlled on low-dose metoprolol and verapamil. She presents today for follow-up independently after contacting the office noting increased palpitations.  She notes since increasing her metoprolol succinate from 12.5 mg to 25 mg daily her symptoms overall improved.  She does not drink caffeine but does note she does not stay well-hydrated.  Also endorses recent family stressors.  She denies chest pain, dyspnea, edema, orthopnea, PND.  10/28/21 TSH 1.0  EKGs/Labs/Other Studies Reviewed:   The following studies were reviewed today:   EKG:  EKG is  ordered today.  The ekg ordered today demonstrates SB 59 bpm with no acute ST/T wave changes  Recent Labs: No results found for requested labs within last 365 days.  Recent Lipid Panel No results found for: "CHOL", "TRIG", "HDL", "CHOLHDL", "VLDL", "LDLCALC", "LDLDIRECT"  Home Medications   Current Meds  Medication Sig   cholecalciferol (VITAMIN D3) 25 MCG (1000 UNIT) tablet Take 1,000 Units by mouth daily.   levothyroxine (SYNTHROID, LEVOTHROID) 137 MCG tablet Take 137 mcg by mouth daily.   lisinopril (PRINIVIL,ZESTRIL) 10 MG tablet Take 10 mg by mouth daily.    omeprazole (PRILOSEC) 40 MG capsule Take 40 mg by mouth 4 (four) times a week.    rosuvastatin (CRESTOR) 10 MG tablet Take 1 tablet (10 mg total) by mouth 3 (three) times a week.   verapamil (VERELAN PM) 120 MG 24 hr capsule Take 120 mg by mouth  every morning.   [DISCONTINUED] metoprolol succinate (TOPROL-XL) 25 MG 24 hr tablet TAKE 1/2 TABLET BY MOUTH ONCE DAILY     Review of Systems      All other systems reviewed and are otherwise negative except as noted above.  Physical Exam    VS:  BP 136/84   Pulse (!) 59   Ht '5\' 4"'$  (1.626 m)   Wt 226 lb (102.5 kg)   LMP 12/20/2006   BMI 38.79 kg/m  , BMI Body mass index is 38.79 kg/m.  Wt Readings from Last 3 Encounters:  08/03/22 226 lb (102.5 kg)  11/25/21 217 lb 12.8 oz (98.8 kg)  06/11/20 205 lb 12.8 oz (93.4 kg)      GEN: Well nourished, well developed, in no acute distress. HEENT: normal. Neck: Supple, no JVD, carotid bruits, or masses. Cardiac: RRR, no murmurs, rubs, or gallops. No clubbing, cyanosis, edema.  Radials/PT 2+ and equal bilaterally.  Respiratory:  Respirations regular and unlabored, clear to auscultation bilaterally. GI: Soft, nontender, nondistended. MS: No deformity or atrophy. Skin: Warm and dry, no rash. Neuro:  Strength and sensation are intact. Psych: Normal affect.  Assessment & Plan    Palpitations-longstanding history.  She does note some improvement since increasing metoprolol succinate from 12.5 to 25 mg last week.  Update B12, BMP, CBC, magnesium to assess for etiology of electrolyte abnormality or anemia.  ZIO monitor placed in clinic to assess for new arrhythmia.  We discussed that recent family stressors are likely contributory-she does have a therapist that she follows with.  Encouraged to increase fluid intake.  She already appropriately avoids caffeine.  Peripheral venous insufficiency-no significant edema on exam.  Continue low-salt diet and to elevate lower extremities when sitting.  Obesity/prediabetes- Weight loss via diet and exercise encouraged. Discussed the impact being overweight would have on cardiovascular risk.  Referred to PREP exercise program at the [facility].  Can consider GLP-1 agent in the future.  Hyperlipidemia-tolerates rosuvastatin 4 days/week.  Continue same.  Hypertension-initial BP in clinic 146/102.  Repeat blood pressure 136/84 without intervention.  She reports blood pressure at home is well-controlled.  Continue present dose verapamil, lisinopril, metoprolol.  Hypothyroidism-follows with Dr. Chalmers Cater of endocrinology.         Disposition: Follow up  as scheduled  with Sanda Klein, MD or APP.  Signed, Loel Dubonnet, NP 08/03/2022, 6:26 PM [place]

## 2022-08-03 NOTE — Patient Instructions (Addendum)
Medication Instructions:  Continue same medications including Metoprolol '25mg'$  daily.   *If you need a refill on your cardiac medications before your next appointment, please call your pharmacy*   Lab Work: Your physician recommends that you return for lab work today: B12, BMP, CBC, magnesium  If you have labs (blood work) drawn today and your tests are completely normal, you will receive your results only by: MyChart Message (if you have MyChart) OR A paper copy in the mail If you have any lab test that is abnormal or we need to change your treatment, we will call you to review the results.   Testing/Procedures: Your physician has recommended that you wear a Zio monitor.   This monitor is a medical device that records the heart's electrical activity. Doctors most often use these monitors to diagnose arrhythmias. Arrhythmias are problems with the speed or rhythm of the heartbeat. The monitor is a small device applied to your chest. You can wear one while you do your normal daily activities. While wearing this monitor if you have any symptoms to push the button and record what you felt. Once you have worn this monitor for the period of time provider prescribed (Usually 14 days), you will return the monitor device in the postage paid box. Once it is returned they will download the data collected and provide [place] with a report which the provider will then review and we will call you with those results. Important tips:  Avoid showering during the first 24 hours of wearing the monitor. Avoid excessive sweating to help maximize wear time. Do not submerge the device, no hot tubs, and no swimming pools. Keep any lotions or oils away from the patch. After 24 hours you may shower with the patch on. Take brief showers with your back facing the shower head.  Do not remove patch once it has been placed because that will interrupt data and decrease adhesive wear time. Push the button when you have any  symptoms and write down what you were feeling. Once you have completed wearing your monitor, remove and place into box which has postage paid and place in your outgoing mailbox.  If for some reason you have misplaced your box then call our office and we can provide another box and/or mail it off for you.  Follow-Up: At [facility], you and your health needs are our priority.  As part of our continuing mission to provide you with exceptional heart care, we have created designated Provider Care Teams.  These Care Teams include your primary Cardiologist (physician) and Advanced Practice Providers (APPs -  Physician Assistants and Nurse Practitioners) who all work together to provide you with the care you need, when you need it.  We recommend signing up for the patient portal called "MyChart".  Sign up information is provided on this After Visit Summary.  MyChart is used to connect with patients for Virtual Visits (Telemedicine).  Patients are able to view lab/test results, encounter notes, upcoming appointments, etc.  Non-urgent messages can be sent to your provider as well.   To learn more about what you can do with MyChart, go to [email or web address].    Your next appointment:   As scheduled with Dr. Sallyanne Kuster   Other Instructions  To prevent palpitations: Make sure you are adequately hydrated.  Avoid and/or limit caffeine containing beverages like soda or tea. Exercise regularly.  Manage stress well. Some over the counter medications can cause palpitations such as Benadryl, AdvilPM, TylenolPM. Regular Advil  or Tylenol do not cause palpitations.

## 2022-08-04 LAB — BASIC METABOLIC PANEL
BUN/Creatinine Ratio: 22 (ref 12–28)
BUN: 20 mg/dL (ref 8–27)
CO2: 27 mmol/L (ref 20–29)
Calcium: 9.2 mg/dL (ref 8.7–10.3)
Chloride: 99 mmol/L (ref 96–106)
Creatinine, Ser: 0.91 mg/dL (ref 0.57–1.00)
Glucose: 93 mg/dL (ref 70–99)
Potassium: 4.7 mmol/L (ref 3.5–5.2)
Sodium: 139 mmol/L (ref 134–144)
eGFR: 68 mL/min/{1.73_m2} (ref >59)

## 2022-08-04 LAB — CBC
Hematocrit: 46 % (ref 34.0–46.6)
Hemoglobin: 15.1 g/dL (ref 11.1–15.9)
MCH: 28.4 pg (ref 26.6–33.0)
MCHC: 32.8 g/dL (ref 31.5–35.7)
MCV: 87 fL (ref 79–97)
Platelets: 287 10*3/uL (ref 150–450)
RBC: 5.31 x10E6/uL — ABNORMAL HIGH (ref 3.77–5.28)
RDW: 14.6 % (ref 11.7–15.4)
WBC: 7.9 10*3/uL (ref 3.4–10.8)

## 2022-08-04 LAB — B12 AND FOLATE PANEL
Folate: 13.9 ng/mL (ref >3.0)
Vitamin B-12: 359 pg/mL (ref 232–1245)

## 2022-08-04 LAB — MAGNESIUM: Magnesium: 2.2 mg/dL (ref 1.6–2.3)

## 2022-08-05 NOTE — Telephone Encounter (Signed)
Loel Dubonnet, NP  Shetara Launer, Jeanmarie Plant, CMA Caller: Unspecified (2 days ago,  5:36 PM) She should take one '25mg'$  tablet daily. Please update Rx. TY!

## 2022-08-06 ENCOUNTER — Telehealth: Payer: Self-pay

## 2022-08-06 NOTE — Telephone Encounter (Signed)
Called to discuss PREP program referral, she works until 4:30, will need evening class; will ask Pam RN [facility] to contact her with next evening class scheduled at [employer].

## 2022-08-24 DIAGNOSIS — F4323 Adjustment disorder with mixed anxiety and depressed mood: Secondary | ICD-10-CM | POA: Diagnosis not present

## 2022-08-27 ENCOUNTER — Telehealth: Payer: Self-pay

## 2022-08-27 NOTE — Telephone Encounter (Signed)
Call to pt reference next PREP class starting on 09/07/22 T/TH [id number] Answered call but asked that I call back at another time-Tuesday.  Will attempt calling for class on Tues.

## 2022-08-30 ENCOUNTER — Telehealth (HOSPITAL_BASED_OUTPATIENT_CLINIC_OR_DEPARTMENT_OTHER): Payer: Self-pay

## 2022-08-30 DIAGNOSIS — R002 Palpitations: Secondary | ICD-10-CM

## 2022-08-30 DIAGNOSIS — E039 Hypothyroidism, unspecified: Secondary | ICD-10-CM

## 2022-08-30 NOTE — Telephone Encounter (Addendum)
Results called to patient who verbalizes understanding! Patient would not like increased dose at this time.    ----- Message from Loel Dubonnet, NP sent at 08/27/2022  7:52 PM EST ----- Monitor with predominantly normal sinus rhythm. There were 14 episodes of a fast heart beat called SVT. These were very short but she did notice them. If she is feeling well, continue same medications. If still noting palpitations, further increase Metoprolol to 37.77m daily.

## 2022-09-03 ENCOUNTER — Telehealth: Payer: Self-pay

## 2022-09-03 NOTE — Telephone Encounter (Signed)
Call to pt reference PREP referral.  Explained program and start of evening class of 09/07/22.  Can start then. Will complete intake on the 1st week of class.  Sent info to patient via email.

## 2022-09-06 ENCOUNTER — Telehealth: Payer: Self-pay | Admitting: Cardiovascular Disease

## 2022-09-06 NOTE — Telephone Encounter (Signed)
Pt c/o medication issue:  1. Name of Medication:   metoprolol succinate (TOPROL-XL) 25 MG 24 hr tablet    2. How are you currently taking this medication (dosage and times per day)?    3. Are you having a reaction (difficulty breathing--STAT)?   4. What is your medication issue?   Patient states she was previously taking an alternate version of Metoprolol previously but during her last appointment she was prescribed Metoprolol Succinate extended release. She is requesting to speak with Dr. Victorino December nurse to clarify.

## 2022-09-06 NOTE — Telephone Encounter (Signed)
Spoke with pt regarding her metoprolol succinate. Pt states that the pill itself has changed and she wants to clarify her medication. Advised pt that occasionally the pill color and shape can change depending on the manufacture. Pt will double check her bottle to make sure the correct medication was sent. Pt states that she was previously on lopressor (metoprolol tartrate) in the past and just wanted to check. Pt is out of town right now and will check the bottle when she gets home. Advised pt if she has received metoprolol tartrate she should reach out to her pharmacy because chart review shows that metoprolol succinate was sent in on our side. Pt will check when she gets home. Pt does state that medication is working for her along with eliminating some stressors from her life. Pt verbalizes understanding.

## 2022-09-10 NOTE — Progress Notes (Signed)
Email received last pm. Does not want to continue with PREP.

## 2022-09-28 DIAGNOSIS — F4323 Adjustment disorder with mixed anxiety and depressed mood: Secondary | ICD-10-CM | POA: Diagnosis not present

## 2022-11-11 ENCOUNTER — Other Ambulatory Visit: Payer: Self-pay | Admitting: Obstetrics and Gynecology

## 2022-11-11 DIAGNOSIS — Z1231 Encounter for screening mammogram for malignant neoplasm of breast: Secondary | ICD-10-CM

## 2022-11-16 ENCOUNTER — Ambulatory Visit: Payer: BC Managed Care – PPO | Attending: Cardiovascular Disease | Admitting: Cardiovascular Disease

## 2022-11-16 ENCOUNTER — Encounter: Payer: Self-pay | Admitting: Cardiovascular Disease

## 2022-11-16 VITALS — BP 140/91 | HR 72 | Ht 64.0 in | Wt 224.0 lb

## 2022-11-16 DIAGNOSIS — I10 Essential (primary) hypertension: Secondary | ICD-10-CM | POA: Diagnosis not present

## 2022-11-16 DIAGNOSIS — R002 Palpitations: Secondary | ICD-10-CM

## 2022-11-16 DIAGNOSIS — E7800 Pure hypercholesterolemia, unspecified: Secondary | ICD-10-CM | POA: Diagnosis not present

## 2022-11-16 DIAGNOSIS — Z6837 Body mass index (BMI) 37.0-37.9, adult: Secondary | ICD-10-CM

## 2022-11-16 NOTE — Patient Instructions (Signed)
Medication Instructions:  No changes *If you need a refill on your cardiac medications before your next appointment, please call your pharmacy*  Follow-Up: At [facility], you and your health needs are our priority.  As part of our continuing mission to provide you with exceptional heart care, we have created designated Provider Care Teams.  These Care Teams include your primary Cardiologist (physician) and Advanced Practice Providers (APPs -  Physician Assistants and Nurse Practitioners) who all work together to provide you with the care you need, when you need it.  We recommend signing up for the patient portal called "MyChart".  Sign up information is provided on this After Visit Summary.  MyChart is used to connect with patients for Virtual Visits (Telemedicine).  Patients are able to view lab/test results, encounter notes, upcoming appointments, etc.  Non-urgent messages can be sent to your provider as well.   To learn more about what you can do with MyChart, go to [email or web address].    Your next appointment:   1 year(s)  Provider:   Mihai Croitoru, MD     

## 2022-11-16 NOTE — Progress Notes (Signed)
Cardiology office note    Date:  11/16/2022   ID:  Elizabeth Berg, DOB 06-28-1951, MRN [id number] PCP:  Merri Brunette, MD  Cardiologist:  Thurmon Fair, MD  Electrophysiologist:  None   Chief Complaint:  Palpitations  History of Present Illness:    Elizabeth Berg is a 72 y.o. 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  Her husband Elizabeth Berg is also my patient and their son Elizabeth Berg has been referred to me.  Her palpitations are still occasionally present but greatly diminished and less troublesome.  They have improved in parallel with the improved social situation of one of her sons.  She does not have problems with shortness of breath, syncope, chest pain, leg edema or focal neurological complaints.  Her blood pressure is slightly elevated today, but at home is consistently less than 120/80.  She is due to have repeat labs including a lipid profile with PCP next week.  She is now taking rosuvastatin four times a week and is tolerating it without side effects.    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 Gerrit Friends     Current Meds  Medication Sig   levothyroxine (SYNTHROID, LEVOTHROID) 137 MCG tablet Take 137 mcg by mouth daily.   lisinopril  (PRINIVIL,ZESTRIL) 10 MG tablet Take 10 mg by mouth daily.    metoprolol succinate (TOPROL-XL) 25 MG 24 hr tablet Take 1 tablet (25 mg total) by mouth daily. Take one 25mg  tablet daily   omeprazole (PRILOSEC) 40 MG capsule Take 40 mg by mouth 4 (four) times a week.    rosuvastatin (CRESTOR) 10 MG tablet Take 1 tablet (10 mg total) by mouth 3 (three) times a week.   verapamil (VERELAN PM) 120 MG 24 hr capsule Take 120 mg by mouth every morning.     Allergies:   Penicillins   Social History   Tobacco Use   Smoking status: Never   Smokeless tobacco: Never  Substance Use Topics   Alcohol use: No   Drug use: No     Family Hx: The patient's family history includes Cancer in her mother; Cancer (age of onset: 41) in her brother; Esophageal cancer in her mother; Rectal cancer in her brother.  ROS:   Please see the history of present illness.     All other systems reviewed and are negative.   Prior CV studies:   The following studies were reviewed today:  Nuclear stress test 2019  Labs/Other Tests and Data Reviewed:    EKG: Was not ordered today.  Personally reviewed the tracing from her previous appointment which shows normal sinus rhythm, normal tracing Recent Labs: 11/11/2020  Creatinine 0.95, ALT 15  06/23/2021 TSH 1.67  Recent Lipid Panel Lipid Panel  No results found for: "CHOL", "TRIG", "HDL", "CHOLHDL", "VLDL", "LDLCALC", "LDLDIRECT", "LABVLDL"  11/17/2021 Cholesterol 176, HDL 49, LDL 117, triglycerides 85  Wt Readings from Last 3 Encounters:  11/16/22 224 lb (101.6 kg)  08/03/22 226 lb (102.5 kg)  11/25/21 217 lb 12.8 oz (98.8 kg)     Objective:    Vital Signs:  BP (!) 140/91   Pulse 72   Ht 5\' 4"  (1.626 m)   Wt 224 lb (101.6 kg)   LMP 12/20/2006   SpO2 95%   BMI 38.45 kg/m      General: Alert, oriented x3, no distress, severely obese Head: no evidence of trauma, PERRL, EOMI, no exophtalmos or lid lag, no myxedema, no xanthelasma; normal ears, nose  and oropharynx Neck: normal jugular venous pulsations and no hepatojugular reflux; brisk carotid pulses without delay and no carotid bruits Chest: clear to auscultation, no signs of consolidation by percussion or palpation, normal fremitus, symmetrical and full respiratory excursions Cardiovascular: normal position and quality of the apical impulse, regular rhythm, normal first and second heart sounds, no murmurs, rubs or gallops Abdomen: no tenderness or distention, no masses by palpation, no abnormal pulsatility or arterial bruits, normal bowel sounds, no hepatosplenomegaly Extremities: no clubbing, cyanosis or edema; 2+ radial, ulnar and brachial pulses bilaterally; 2+ right femoral, posterior tibial and dorsalis pedis pulses; 2+ left femoral, posterior tibial and dorsalis pedis pulses; no subclavian or femoral bruits Neurological: grossly nonfocal Psych: Normal mood and affect     ASSESSMENT & PLAN:     1. Palpitations   2. Class 2 severe obesity due to excess calories with serious comorbidity and body mass index (BMI) of 37.0 to 37.9 in adult (HCC)   3. Essential hypertension   4. Hypercholesterolemia       Palpitations: Improved.  They clearly parallel her degree of emotional stress.  Currently not a big complaint. Peripheral venous insufficiency: No edema at this time. Obesity/prediabetes: Remains severely obese with a BMI over 37. HLP: Tolerating rosuvastatin 4 days weekly with plans to recheck the labs next week in Dr. Carolee Rota office. HTN: Little high today, as has been the case in the past when checked in the office.  Well-controlled when checked at home.  No changes to her medications.  Will monitor.  Patient Instructions  Medication Instructions:  No changes *If you need a refill on your cardiac medications before your next appointment, please call your pharmacy*  Follow-Up: At [facility], you and your health needs are our priority.  As part of our continuing  mission to provide you with exceptional heart care, we have created designated Provider Care Teams.  These Care Teams include your primary Cardiologist (physician) and Advanced Practice Providers (APPs -  Physician Assistants and Nurse Practitioners) who all work together to provide you with the care you need, when you need it.  We recommend signing up for the patient portal called "MyChart".  Sign up information is provided on this After Visit Summary.  MyChart is used to connect with patients for Virtual Visits (Telemedicine).  Patients are able to view lab/test results, encounter notes, upcoming appointments, etc.  Non-urgent messages can be sent to your provider as well.   To learn more about what you can do with MyChart, go to [email or web address].    Your next appointment:   1 year(s)  Provider:   Thurmon Fair, MD     Signed, Rachelle Hora  Zakyah Yanes, MD  11/16/2022 10:42 AM    [facility]

## 2022-11-23 DIAGNOSIS — E890 Postprocedural hypothyroidism: Secondary | ICD-10-CM | POA: Diagnosis not present

## 2022-11-24 LAB — LAB REPORT - SCANNED
EGFR: 67
EGFR: 67

## 2022-11-30 DIAGNOSIS — K219 Gastro-esophageal reflux disease without esophagitis: Secondary | ICD-10-CM | POA: Diagnosis not present

## 2022-11-30 DIAGNOSIS — Z0000 Encounter for general adult medical examination without abnormal findings: Secondary | ICD-10-CM | POA: Diagnosis not present

## 2022-11-30 DIAGNOSIS — E890 Postprocedural hypothyroidism: Secondary | ICD-10-CM | POA: Diagnosis not present

## 2022-11-30 DIAGNOSIS — K2270 Barrett's esophagus without dysplasia: Secondary | ICD-10-CM | POA: Diagnosis not present

## 2022-11-30 DIAGNOSIS — E785 Hyperlipidemia, unspecified: Secondary | ICD-10-CM | POA: Diagnosis not present

## 2022-12-07 DIAGNOSIS — R7303 Prediabetes: Secondary | ICD-10-CM | POA: Diagnosis not present

## 2022-12-07 DIAGNOSIS — R262 Difficulty in walking, not elsewhere classified: Secondary | ICD-10-CM | POA: Diagnosis not present

## 2022-12-07 DIAGNOSIS — I10 Essential (primary) hypertension: Secondary | ICD-10-CM | POA: Diagnosis not present

## 2022-12-07 DIAGNOSIS — M25571 Pain in right ankle and joints of right foot: Secondary | ICD-10-CM | POA: Diagnosis not present

## 2022-12-07 DIAGNOSIS — M25671 Stiffness of right ankle, not elsewhere classified: Secondary | ICD-10-CM | POA: Diagnosis not present

## 2022-12-09 DIAGNOSIS — M25571 Pain in right ankle and joints of right foot: Secondary | ICD-10-CM | POA: Diagnosis not present

## 2022-12-09 DIAGNOSIS — R262 Difficulty in walking, not elsewhere classified: Secondary | ICD-10-CM | POA: Diagnosis not present

## 2022-12-09 DIAGNOSIS — M25671 Stiffness of right ankle, not elsewhere classified: Secondary | ICD-10-CM | POA: Diagnosis not present

## 2022-12-14 DIAGNOSIS — R262 Difficulty in walking, not elsewhere classified: Secondary | ICD-10-CM | POA: Diagnosis not present

## 2022-12-14 DIAGNOSIS — M25571 Pain in right ankle and joints of right foot: Secondary | ICD-10-CM | POA: Diagnosis not present

## 2022-12-14 DIAGNOSIS — M25671 Stiffness of right ankle, not elsewhere classified: Secondary | ICD-10-CM | POA: Diagnosis not present

## 2022-12-17 DIAGNOSIS — M25571 Pain in right ankle and joints of right foot: Secondary | ICD-10-CM | POA: Diagnosis not present

## 2022-12-17 DIAGNOSIS — M25671 Stiffness of right ankle, not elsewhere classified: Secondary | ICD-10-CM | POA: Diagnosis not present

## 2022-12-17 DIAGNOSIS — R262 Difficulty in walking, not elsewhere classified: Secondary | ICD-10-CM | POA: Diagnosis not present

## 2022-12-28 DIAGNOSIS — R262 Difficulty in walking, not elsewhere classified: Secondary | ICD-10-CM | POA: Diagnosis not present

## 2022-12-28 DIAGNOSIS — M25571 Pain in right ankle and joints of right foot: Secondary | ICD-10-CM | POA: Diagnosis not present

## 2022-12-28 DIAGNOSIS — M25671 Stiffness of right ankle, not elsewhere classified: Secondary | ICD-10-CM | POA: Diagnosis not present

## 2022-12-31 DIAGNOSIS — M25571 Pain in right ankle and joints of right foot: Secondary | ICD-10-CM | POA: Diagnosis not present

## 2022-12-31 DIAGNOSIS — M25671 Stiffness of right ankle, not elsewhere classified: Secondary | ICD-10-CM | POA: Diagnosis not present

## 2022-12-31 DIAGNOSIS — R262 Difficulty in walking, not elsewhere classified: Secondary | ICD-10-CM | POA: Diagnosis not present

## 2023-01-07 DIAGNOSIS — M25671 Stiffness of right ankle, not elsewhere classified: Secondary | ICD-10-CM | POA: Diagnosis not present

## 2023-01-07 DIAGNOSIS — M25571 Pain in right ankle and joints of right foot: Secondary | ICD-10-CM | POA: Diagnosis not present

## 2023-01-07 DIAGNOSIS — R262 Difficulty in walking, not elsewhere classified: Secondary | ICD-10-CM | POA: Diagnosis not present

## 2023-01-07 DIAGNOSIS — N281 Cyst of kidney, acquired: Secondary | ICD-10-CM | POA: Diagnosis not present

## 2023-01-13 DIAGNOSIS — M25671 Stiffness of right ankle, not elsewhere classified: Secondary | ICD-10-CM | POA: Diagnosis not present

## 2023-01-13 DIAGNOSIS — M25571 Pain in right ankle and joints of right foot: Secondary | ICD-10-CM | POA: Diagnosis not present

## 2023-01-13 DIAGNOSIS — R262 Difficulty in walking, not elsewhere classified: Secondary | ICD-10-CM | POA: Diagnosis not present

## 2023-01-25 DIAGNOSIS — M79662 Pain in left lower leg: Secondary | ICD-10-CM | POA: Diagnosis not present

## 2023-01-25 DIAGNOSIS — M79605 Pain in left leg: Secondary | ICD-10-CM | POA: Diagnosis not present

## 2023-01-25 DIAGNOSIS — M25671 Stiffness of right ankle, not elsewhere classified: Secondary | ICD-10-CM | POA: Diagnosis not present

## 2023-01-25 DIAGNOSIS — I87393 Chronic venous hypertension (idiopathic) with other complications of bilateral lower extremity: Secondary | ICD-10-CM | POA: Diagnosis not present

## 2023-01-25 DIAGNOSIS — M79661 Pain in right lower leg: Secondary | ICD-10-CM | POA: Diagnosis not present

## 2023-01-25 DIAGNOSIS — R262 Difficulty in walking, not elsewhere classified: Secondary | ICD-10-CM | POA: Diagnosis not present

## 2023-01-25 DIAGNOSIS — M25571 Pain in right ankle and joints of right foot: Secondary | ICD-10-CM | POA: Diagnosis not present

## 2023-01-25 DIAGNOSIS — M79604 Pain in right leg: Secondary | ICD-10-CM | POA: Diagnosis not present

## 2023-02-01 DIAGNOSIS — M25671 Stiffness of right ankle, not elsewhere classified: Secondary | ICD-10-CM | POA: Diagnosis not present

## 2023-02-01 DIAGNOSIS — M25571 Pain in right ankle and joints of right foot: Secondary | ICD-10-CM | POA: Diagnosis not present

## 2023-02-01 DIAGNOSIS — R262 Difficulty in walking, not elsewhere classified: Secondary | ICD-10-CM | POA: Diagnosis not present

## 2023-02-03 DIAGNOSIS — M25571 Pain in right ankle and joints of right foot: Secondary | ICD-10-CM | POA: Diagnosis not present

## 2023-02-03 DIAGNOSIS — M25671 Stiffness of right ankle, not elsewhere classified: Secondary | ICD-10-CM | POA: Diagnosis not present

## 2023-02-03 DIAGNOSIS — R262 Difficulty in walking, not elsewhere classified: Secondary | ICD-10-CM | POA: Diagnosis not present

## 2023-02-10 DIAGNOSIS — R262 Difficulty in walking, not elsewhere classified: Secondary | ICD-10-CM | POA: Diagnosis not present

## 2023-02-10 DIAGNOSIS — M25671 Stiffness of right ankle, not elsewhere classified: Secondary | ICD-10-CM | POA: Diagnosis not present

## 2023-02-10 DIAGNOSIS — M25571 Pain in right ankle and joints of right foot: Secondary | ICD-10-CM | POA: Diagnosis not present

## 2023-02-14 DIAGNOSIS — M791 Myalgia, unspecified site: Secondary | ICD-10-CM | POA: Diagnosis not present

## 2023-02-14 DIAGNOSIS — M79605 Pain in left leg: Secondary | ICD-10-CM | POA: Diagnosis not present

## 2023-02-14 DIAGNOSIS — E890 Postprocedural hypothyroidism: Secondary | ICD-10-CM | POA: Diagnosis not present

## 2023-02-14 DIAGNOSIS — M79604 Pain in right leg: Secondary | ICD-10-CM | POA: Diagnosis not present

## 2023-02-14 DIAGNOSIS — Z79899 Other long term (current) drug therapy: Secondary | ICD-10-CM | POA: Diagnosis not present

## 2023-02-15 DIAGNOSIS — M25571 Pain in right ankle and joints of right foot: Secondary | ICD-10-CM | POA: Diagnosis not present

## 2023-02-15 DIAGNOSIS — R262 Difficulty in walking, not elsewhere classified: Secondary | ICD-10-CM | POA: Diagnosis not present

## 2023-02-15 DIAGNOSIS — M25671 Stiffness of right ankle, not elsewhere classified: Secondary | ICD-10-CM | POA: Diagnosis not present

## 2023-02-18 ENCOUNTER — Ambulatory Visit: Admission: RE | Admit: 2023-02-18 | Payer: BC Managed Care – PPO | Source: Ambulatory Visit

## 2023-02-18 DIAGNOSIS — Z1231 Encounter for screening mammogram for malignant neoplasm of breast: Secondary | ICD-10-CM

## 2023-02-22 DIAGNOSIS — Z85828 Personal history of other malignant neoplasm of skin: Secondary | ICD-10-CM | POA: Diagnosis not present

## 2023-02-22 DIAGNOSIS — L905 Scar conditions and fibrosis of skin: Secondary | ICD-10-CM | POA: Diagnosis not present

## 2023-02-22 DIAGNOSIS — D2262 Melanocytic nevi of left upper limb, including shoulder: Secondary | ICD-10-CM | POA: Diagnosis not present

## 2023-02-22 DIAGNOSIS — D225 Melanocytic nevi of trunk: Secondary | ICD-10-CM | POA: Diagnosis not present

## 2023-02-22 DIAGNOSIS — L820 Inflamed seborrheic keratosis: Secondary | ICD-10-CM | POA: Diagnosis not present

## 2023-03-02 DIAGNOSIS — R262 Difficulty in walking, not elsewhere classified: Secondary | ICD-10-CM | POA: Diagnosis not present

## 2023-03-02 DIAGNOSIS — M25571 Pain in right ankle and joints of right foot: Secondary | ICD-10-CM | POA: Diagnosis not present

## 2023-03-02 DIAGNOSIS — M25671 Stiffness of right ankle, not elsewhere classified: Secondary | ICD-10-CM | POA: Diagnosis not present

## 2023-03-03 DIAGNOSIS — E890 Postprocedural hypothyroidism: Secondary | ICD-10-CM | POA: Diagnosis not present

## 2023-03-03 DIAGNOSIS — M79604 Pain in right leg: Secondary | ICD-10-CM | POA: Diagnosis not present

## 2023-03-03 DIAGNOSIS — M79605 Pain in left leg: Secondary | ICD-10-CM | POA: Diagnosis not present

## 2023-03-04 NOTE — Progress Notes (Unsigned)
   Rubin Payor, PhD, LAT, ATC acting as a scribe for Clementeen Graham, MD.  Elizabeth Berg is a 72 y.o. female who presents to [facility] at [facility] today for bilat leg pain x ***. Pt locates pain to ***  Low back pain: Radiating pain: LE numbness/tingling: LE weakness: Aggravates: Treatments tried:  Dx imaging: 03/30/15 L-spine MRI  Pertinent review of systems: ***  Relevant historical information: ***   Exam:  LMP 12/20/2006  General: Well Developed, well nourished, and in no acute distress.   MSK: ***    Lab and Radiology Results No results found for this or any previous visit (from the past 72 hour(s)). No results found.     Assessment and Plan: 72 y.o. female with ***   PDMP not reviewed this encounter. No orders of the defined types were placed in this encounter.  No orders of the defined types were placed in this encounter.    Discussed warning signs or symptoms. Please see discharge instructions. Patient expresses understanding.   ***

## 2023-03-07 ENCOUNTER — Encounter: Payer: Self-pay | Admitting: Family Medicine

## 2023-03-07 ENCOUNTER — Ambulatory Visit (INDEPENDENT_AMBULATORY_CARE_PROVIDER_SITE_OTHER): Payer: BC Managed Care – PPO | Admitting: Family Medicine

## 2023-03-07 ENCOUNTER — Ambulatory Visit (INDEPENDENT_AMBULATORY_CARE_PROVIDER_SITE_OTHER): Payer: BC Managed Care – PPO

## 2023-03-07 VITALS — BP 120/82 | HR 74 | Ht 64.0 in | Wt 217.0 lb

## 2023-03-07 DIAGNOSIS — M47816 Spondylosis without myelopathy or radiculopathy, lumbar region: Secondary | ICD-10-CM | POA: Diagnosis not present

## 2023-03-07 DIAGNOSIS — G8929 Other chronic pain: Secondary | ICD-10-CM

## 2023-03-07 DIAGNOSIS — M5441 Lumbago with sciatica, right side: Secondary | ICD-10-CM

## 2023-03-07 DIAGNOSIS — M545 Low back pain, unspecified: Secondary | ICD-10-CM | POA: Diagnosis not present

## 2023-03-07 DIAGNOSIS — M4316 Spondylolisthesis, lumbar region: Secondary | ICD-10-CM | POA: Diagnosis not present

## 2023-03-07 MED ORDER — GABAPENTIN 100 MG PO CAPS: 100.0000 mg | ORAL_CAPSULE | Freq: Every evening | ORAL | 3 refills | Status: DC | PRN

## 2023-03-07 NOTE — Patient Instructions (Addendum)
Thank you for coming in today.     Please get an Xray today before you leave   You should hear from MRI scheduling within 1 week. If you do not hear please let me know.    Take the gabapentin at bedtime as needed for nerve ain

## 2023-03-12 ENCOUNTER — Ambulatory Visit (INDEPENDENT_AMBULATORY_CARE_PROVIDER_SITE_OTHER): Payer: BC Managed Care – PPO

## 2023-03-12 DIAGNOSIS — M5441 Lumbago with sciatica, right side: Secondary | ICD-10-CM

## 2023-03-12 DIAGNOSIS — M4726 Other spondylosis with radiculopathy, lumbar region: Secondary | ICD-10-CM | POA: Diagnosis not present

## 2023-03-12 DIAGNOSIS — G8929 Other chronic pain: Secondary | ICD-10-CM

## 2023-03-15 NOTE — Progress Notes (Signed)
Lumbar spine x-ray shows mild arthritis.

## 2023-03-16 DIAGNOSIS — M25671 Stiffness of right ankle, not elsewhere classified: Secondary | ICD-10-CM | POA: Diagnosis not present

## 2023-03-16 DIAGNOSIS — R262 Difficulty in walking, not elsewhere classified: Secondary | ICD-10-CM | POA: Diagnosis not present

## 2023-03-16 DIAGNOSIS — M25571 Pain in right ankle and joints of right foot: Secondary | ICD-10-CM | POA: Diagnosis not present

## 2023-03-18 DIAGNOSIS — M25571 Pain in right ankle and joints of right foot: Secondary | ICD-10-CM | POA: Diagnosis not present

## 2023-03-18 DIAGNOSIS — R262 Difficulty in walking, not elsewhere classified: Secondary | ICD-10-CM | POA: Diagnosis not present

## 2023-03-18 DIAGNOSIS — M25671 Stiffness of right ankle, not elsewhere classified: Secondary | ICD-10-CM | POA: Diagnosis not present

## 2023-03-28 DIAGNOSIS — M25571 Pain in right ankle and joints of right foot: Secondary | ICD-10-CM | POA: Diagnosis not present

## 2023-03-28 DIAGNOSIS — M25671 Stiffness of right ankle, not elsewhere classified: Secondary | ICD-10-CM | POA: Diagnosis not present

## 2023-03-28 DIAGNOSIS — R262 Difficulty in walking, not elsewhere classified: Secondary | ICD-10-CM | POA: Diagnosis not present

## 2023-03-30 NOTE — Progress Notes (Signed)
MRI lumbar spine does not show an obvious pinched nerve causing pain to radiate down your leg.  Pain could be piriformis syndrome which could improve with a bit more physical therapy.  We could get a dedicated imaging test of your pelvis or hip.  Recommend return to clinic to go over the results in full detail and talk about treatment plan and options.

## 2023-03-31 NOTE — Progress Notes (Signed)
I, Stevenson Clinch, CMA acting as a scribe for Clementeen Graham, MD.  Elizabeth Berg is a 72 y.o. female who presents to [facility] at [facility] today for f/u LBP w/ MRI review. Pt was last seen by Dr. Denyse Amass on 03/07/23 and was prescribed gabapentin and was advised to proceed to MRI. Today, pt reports that sx have been very slowly improving with PT at [facility] with [facility]. Otherwise, denies new or worsening sx. Review MRI today.   Dx imaging: 03/12/23 L-spine MRI 03/07/23 L-spine XR 03/30/15 L-spine MRI   Pertinent review of systems: No fevers or chills  Relevant historical information: History of breast cancer   Exam:  BP 130/86   Pulse 70   Ht 5\' 4"  (1.626 m)   Wt 220 lb (99.8 kg)   LMP 12/20/2006   SpO2 97%   BMI 37.76 kg/m  General: Well Developed, well nourished, and in no acute distress.   MSK: Normal gait    Lab and Radiology Results  EXAM: MRI LUMBAR SPINE WITHOUT CONTRAST   TECHNIQUE: Multiplanar, multisequence MR imaging of the lumbar spine was performed. No intravenous contrast was administered.   COMPARISON:  03/30/2015   FINDINGS: Segmentation:  Standard   Alignment:  Normal   Vertebrae:  No fracture, evidence of discitis, or bone lesion.   Conus medullaris and cauda equina: Conus extends to the L1 level. Conus and cauda equina appear normal.   Paraspinal and other soft tissues: Negative   Disc levels:   T11-12: Unchanged mild disc bulge without stenosis.   T12-L1: Normal.   L1-L2: Normal disc space and facet joints. No spinal canal stenosis. No neural foraminal stenosis.   L2-L3: Normal disc space and facet joints. No spinal canal stenosis. No neural foraminal stenosis.   L3-L4: Normal disc space and facet joints. No spinal canal stenosis. No neural foraminal stenosis.   L4-L5: Mild facet hypertrophy. No spinal canal stenosis. No neural foraminal stenosis.   L5-S1: Mild facet hypertrophy. No spinal canal  stenosis. No neural foraminal stenosis.   Visualized sacrum: Normal.   IMPRESSION: Mild lower lumbar facet arthrosis. No spinal canal or neural foraminal stenosis.     Electronically Signed   By: Deatra Robinson M.D.   On: 03/30/2023 02:18 I, Clementeen Graham, personally (independently) visualized and performed the interpretation of the images attached in this note.     Assessment and Plan: 72 y.o. female with continued right lateral hip pain radiating down into the lateral thigh with some paresthesias into her feet bilaterally.  She is done with considerable amount of physical therapy with [facility] with a reasonable amount of benefit.  She still has some residual hip pain and some mildly bothersome paresthesias into both feet.  Lumbar spine MRI thankfully was relatively normal with no significant neuroforaminal stenosis or other pinched nerves.  She has had a nerve conduction study in 2020 one of her lower extremity which was unremarkable.  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  Next diagnostic step could be hip MRI and/or a repeat nerve conduction study.  She would like to hold off on these which I think is reasonable.  We talked about a lower inflammatory diet and adding turmeric as reasonably safe options that might be helpful.  Continue home exercise program and PT and check back as needed.   PDMP not reviewed this  encounter. No orders of the defined types were placed in this encounter.  No orders of the defined types were placed in this encounter.    Discussed warning signs or symptoms. Please see discharge instructions. Patient expresses understanding.   The above documentation has been reviewed and is accurate and complete Clementeen Graham, M.D. Total encounter time 20 minutes including face-to-face time with the patient and, reviewing past medical record,  and charting on the date of service.

## 2023-04-01 ENCOUNTER — Ambulatory Visit (INDEPENDENT_AMBULATORY_CARE_PROVIDER_SITE_OTHER): Payer: BC Managed Care – PPO | Admitting: Family Medicine

## 2023-04-01 ENCOUNTER — Encounter: Payer: Self-pay | Admitting: Family Medicine

## 2023-04-01 VITALS — BP 130/86 | HR 70 | Ht 64.0 in | Wt 220.0 lb

## 2023-04-01 DIAGNOSIS — R202 Paresthesia of skin: Secondary | ICD-10-CM

## 2023-04-01 DIAGNOSIS — M25551 Pain in right hip: Secondary | ICD-10-CM

## 2023-04-01 NOTE — Patient Instructions (Signed)
Thank you for coming in today.   Continue home exercises form PT.   Consider Tumeric and Low inflammatory diet.   Recheck as needed.   We could do more

## 2023-04-08 DIAGNOSIS — M25671 Stiffness of right ankle, not elsewhere classified: Secondary | ICD-10-CM | POA: Diagnosis not present

## 2023-04-08 DIAGNOSIS — M25571 Pain in right ankle and joints of right foot: Secondary | ICD-10-CM | POA: Diagnosis not present

## 2023-04-08 DIAGNOSIS — R262 Difficulty in walking, not elsewhere classified: Secondary | ICD-10-CM | POA: Diagnosis not present

## 2023-04-19 DIAGNOSIS — M25671 Stiffness of right ankle, not elsewhere classified: Secondary | ICD-10-CM | POA: Diagnosis not present

## 2023-04-19 DIAGNOSIS — R262 Difficulty in walking, not elsewhere classified: Secondary | ICD-10-CM | POA: Diagnosis not present

## 2023-04-19 DIAGNOSIS — M25571 Pain in right ankle and joints of right foot: Secondary | ICD-10-CM | POA: Diagnosis not present

## 2023-05-04 DIAGNOSIS — R1909 Other intra-abdominal and pelvic swelling, mass and lump: Secondary | ICD-10-CM | POA: Diagnosis not present

## 2023-05-04 DIAGNOSIS — R102 Pelvic and perineal pain: Secondary | ICD-10-CM | POA: Diagnosis not present

## 2023-05-10 ENCOUNTER — Encounter: Payer: Self-pay | Admitting: Internal Medicine

## 2023-05-10 ENCOUNTER — Other Ambulatory Visit: Payer: Self-pay | Admitting: Internal Medicine

## 2023-05-10 DIAGNOSIS — R1909 Other intra-abdominal and pelvic swelling, mass and lump: Secondary | ICD-10-CM

## 2023-05-11 ENCOUNTER — Inpatient Hospital Stay
Admission: RE | Admit: 2023-05-11 | Discharge: 2023-05-11 | Discharge status: Home / Self Care | Payer: BC Managed Care – PPO | Source: Ambulatory Visit | Attending: Internal Medicine | Admitting: Internal Medicine

## 2023-05-11 DIAGNOSIS — R1909 Other intra-abdominal and pelvic swelling, mass and lump: Secondary | ICD-10-CM

## 2023-05-12 ENCOUNTER — Other Ambulatory Visit: Payer: BC Managed Care – PPO

## 2023-06-06 DIAGNOSIS — E559 Vitamin D deficiency, unspecified: Secondary | ICD-10-CM | POA: Diagnosis not present

## 2023-06-06 DIAGNOSIS — E785 Hyperlipidemia, unspecified: Secondary | ICD-10-CM | POA: Diagnosis not present

## 2023-06-10 DIAGNOSIS — E559 Vitamin D deficiency, unspecified: Secondary | ICD-10-CM | POA: Diagnosis not present

## 2023-06-10 DIAGNOSIS — E890 Postprocedural hypothyroidism: Secondary | ICD-10-CM | POA: Diagnosis not present

## 2023-06-10 DIAGNOSIS — I10 Essential (primary) hypertension: Secondary | ICD-10-CM | POA: Diagnosis not present

## 2023-12-02 DIAGNOSIS — R7303 Prediabetes: Secondary | ICD-10-CM | POA: Diagnosis not present

## 2023-12-02 DIAGNOSIS — I10 Essential (primary) hypertension: Secondary | ICD-10-CM | POA: Diagnosis not present

## 2023-12-02 DIAGNOSIS — E559 Vitamin D deficiency, unspecified: Secondary | ICD-10-CM | POA: Diagnosis not present

## 2023-12-02 DIAGNOSIS — E785 Hyperlipidemia, unspecified: Secondary | ICD-10-CM | POA: Diagnosis not present

## 2023-12-06 ENCOUNTER — Other Ambulatory Visit (HOSPITAL_COMMUNITY): Payer: Self-pay | Admitting: Internal Medicine

## 2023-12-06 DIAGNOSIS — E559 Vitamin D deficiency, unspecified: Secondary | ICD-10-CM | POA: Diagnosis not present

## 2023-12-06 DIAGNOSIS — E890 Postprocedural hypothyroidism: Secondary | ICD-10-CM | POA: Diagnosis not present

## 2023-12-06 DIAGNOSIS — K219 Gastro-esophageal reflux disease without esophagitis: Secondary | ICD-10-CM | POA: Diagnosis not present

## 2023-12-06 DIAGNOSIS — M8580 Other specified disorders of bone density and structure, unspecified site: Secondary | ICD-10-CM | POA: Diagnosis not present

## 2023-12-06 DIAGNOSIS — I872 Venous insufficiency (chronic) (peripheral): Secondary | ICD-10-CM | POA: Diagnosis not present

## 2023-12-06 DIAGNOSIS — N281 Cyst of kidney, acquired: Secondary | ICD-10-CM | POA: Diagnosis not present

## 2023-12-06 DIAGNOSIS — E785 Hyperlipidemia, unspecified: Secondary | ICD-10-CM

## 2023-12-06 DIAGNOSIS — Z0000 Encounter for general adult medical examination without abnormal findings: Secondary | ICD-10-CM | POA: Diagnosis not present

## 2023-12-06 DIAGNOSIS — I10 Essential (primary) hypertension: Secondary | ICD-10-CM | POA: Diagnosis not present

## 2023-12-06 DIAGNOSIS — R718 Other abnormality of red blood cells: Secondary | ICD-10-CM | POA: Diagnosis not present

## 2023-12-06 DIAGNOSIS — K2270 Barrett's esophagus without dysplasia: Secondary | ICD-10-CM | POA: Diagnosis not present

## 2023-12-06 DIAGNOSIS — Z6837 Body mass index (BMI) 37.0-37.9, adult: Secondary | ICD-10-CM | POA: Diagnosis not present

## 2023-12-20 ENCOUNTER — Ambulatory Visit (HOSPITAL_BASED_OUTPATIENT_CLINIC_OR_DEPARTMENT_OTHER)
Admission: RE | Admit: 2023-12-20 | Discharge: 2023-12-20 | Disposition: A | Discharge status: Home / Self Care | Payer: Self-pay | Source: Ambulatory Visit | Attending: Internal Medicine | Admitting: Internal Medicine

## 2023-12-20 DIAGNOSIS — E785 Hyperlipidemia, unspecified: Secondary | ICD-10-CM | POA: Insufficient documentation

## 2023-12-23 ENCOUNTER — Telehealth: Payer: Self-pay | Admitting: Cardiovascular Disease

## 2023-12-23 NOTE — Telephone Encounter (Signed)
 Patient wants to get Dr. Leola Raisin feedback on her CT CARDIAC SCORING test.  Patient stated can send message through MyChart.

## 2023-12-23 NOTE — Telephone Encounter (Signed)
 Routing to Dr Alvis Ba for his review.

## 2023-12-26 NOTE — Telephone Encounter (Signed)
 Pt following up

## 2024-01-05 ENCOUNTER — Other Ambulatory Visit: Payer: Self-pay | Admitting: Obstetrics and Gynecology

## 2024-01-05 DIAGNOSIS — Z1231 Encounter for screening mammogram for malignant neoplasm of breast: Secondary | ICD-10-CM

## 2024-01-18 DIAGNOSIS — M8580 Other specified disorders of bone density and structure, unspecified site: Secondary | ICD-10-CM | POA: Diagnosis not present

## 2024-01-18 DIAGNOSIS — R718 Other abnormality of red blood cells: Secondary | ICD-10-CM | POA: Diagnosis not present

## 2024-01-18 DIAGNOSIS — M8589 Other specified disorders of bone density and structure, multiple sites: Secondary | ICD-10-CM | POA: Diagnosis not present

## 2024-01-18 DIAGNOSIS — E785 Hyperlipidemia, unspecified: Secondary | ICD-10-CM | POA: Diagnosis not present

## 2024-01-18 DIAGNOSIS — I10 Essential (primary) hypertension: Secondary | ICD-10-CM | POA: Diagnosis not present

## 2024-02-07 DIAGNOSIS — K219 Gastro-esophageal reflux disease without esophagitis: Secondary | ICD-10-CM | POA: Diagnosis not present

## 2024-02-07 DIAGNOSIS — K5900 Constipation, unspecified: Secondary | ICD-10-CM | POA: Diagnosis not present

## 2024-02-07 DIAGNOSIS — Z1211 Encounter for screening for malignant neoplasm of colon: Secondary | ICD-10-CM | POA: Diagnosis not present

## 2024-02-07 DIAGNOSIS — Z800 Family history of malignant neoplasm of digestive organs: Secondary | ICD-10-CM | POA: Diagnosis not present

## 2024-02-08 ENCOUNTER — Encounter: Payer: Self-pay | Admitting: Medical Oncology

## 2024-02-08 ENCOUNTER — Inpatient Hospital Stay (HOSPITAL_BASED_OUTPATIENT_CLINIC_OR_DEPARTMENT_OTHER): Admitting: Medical Oncology

## 2024-02-08 ENCOUNTER — Inpatient Hospital Stay: Attending: Medical Oncology

## 2024-02-08 VITALS — BP 147/73 | HR 66 | Temp 98.9°F | Resp 17 | Ht 63.75 in | Wt 204.1 lb

## 2024-02-08 DIAGNOSIS — Z853 Personal history of malignant neoplasm of breast: Secondary | ICD-10-CM | POA: Insufficient documentation

## 2024-02-08 DIAGNOSIS — Z923 Personal history of irradiation: Secondary | ICD-10-CM | POA: Insufficient documentation

## 2024-02-08 DIAGNOSIS — R718 Other abnormality of red blood cells: Secondary | ICD-10-CM

## 2024-02-08 DIAGNOSIS — D751 Secondary polycythemia: Secondary | ICD-10-CM | POA: Diagnosis not present

## 2024-02-08 DIAGNOSIS — Z79899 Other long term (current) drug therapy: Secondary | ICD-10-CM | POA: Insufficient documentation

## 2024-02-08 DIAGNOSIS — Z808 Family history of malignant neoplasm of other organs or systems: Secondary | ICD-10-CM

## 2024-02-08 DIAGNOSIS — R7989 Other specified abnormal findings of blood chemistry: Secondary | ICD-10-CM | POA: Diagnosis not present

## 2024-02-08 DIAGNOSIS — R5383 Other fatigue: Secondary | ICD-10-CM

## 2024-02-08 LAB — CBC WITH DIFFERENTIAL (CANCER CENTER ONLY)
Abs Immature Granulocytes: 0.02 K/uL (ref 0.00–0.07)
Basophils Absolute: 0 K/uL (ref 0.0–0.1)
Basophils Relative: 0 %
Eosinophils Absolute: 0 K/uL (ref 0.0–0.5)
Eosinophils Relative: 1 %
HCT: 44.4 % (ref 36.0–46.0)
Hemoglobin: 14.7 g/dL (ref 12.0–15.0)
Immature Granulocytes: 0 %
Lymphocytes Relative: 23 %
Lymphs Abs: 1.9 K/uL (ref 0.7–4.0)
MCH: 29 pg (ref 26.0–34.0)
MCHC: 33.1 g/dL (ref 30.0–36.0)
MCV: 87.6 fL (ref 80.0–100.0)
Monocytes Absolute: 0.6 K/uL (ref 0.1–1.0)
Monocytes Relative: 7 %
Neutro Abs: 5.5 K/uL (ref 1.7–7.7)
Neutrophils Relative %: 69 %
Platelet Count: 220 K/uL (ref 150–400)
RBC: 5.07 MIL/uL (ref 3.87–5.11)
RDW: 15.2 % (ref 11.5–15.5)
WBC Count: 7.9 K/uL (ref 4.0–10.5)
nRBC: 0 % (ref 0.0–0.2)

## 2024-02-08 LAB — RETICULOCYTES
Immature Retic Fract: 13.5 % (ref 2.3–15.9)
RBC.: 5.11 MIL/uL (ref 3.87–5.11)
Retic Count, Absolute: 89.4 K/uL (ref 19.0–186.0)
Retic Ct Pct: 1.8 % (ref 0.4–3.1)

## 2024-02-08 LAB — CMP (CANCER CENTER ONLY)
ALT: 21 U/L (ref 0–44)
AST: 28 U/L (ref 15–41)
Albumin: 4.4 g/dL (ref 3.5–5.0)
Alkaline Phosphatase: 70 U/L (ref 38–126)
Anion gap: 11 (ref 5–15)
BUN: 23 mg/dL (ref 8–23)
CO2: 28 mmol/L (ref 22–32)
Calcium: 9.4 mg/dL (ref 8.9–10.3)
Chloride: 102 mmol/L (ref 98–111)
Creatinine: 0.89 mg/dL (ref 0.44–1.00)
GFR, Estimated: >60 mL/min (ref >60)
Glucose, Bld: 95 mg/dL (ref 70–99)
Potassium: 5 mmol/L (ref 3.5–5.1)
Sodium: 141 mmol/L (ref 135–145)
Total Bilirubin: 0.5 mg/dL (ref 0.0–1.2)
Total Protein: 7.5 g/dL (ref 6.5–8.1)

## 2024-02-08 LAB — IRON AND IRON BINDING CAPACITY (CC-WL,HP ONLY)
Iron: 71 ug/dL (ref 28–170)
Saturation Ratios: 17 % (ref 10.4–31.8)
TIBC: 407 ug/dL (ref 250–450)
UIBC: 336 ug/dL

## 2024-02-08 LAB — FERRITIN: Ferritin: 59 ng/mL (ref 11–307)

## 2024-02-08 NOTE — Progress Notes (Signed)
 [facility] [phone]   [phone]  INITIAL CONSULT NOTE  Patient Care Team: Clarice Nottingham, MD as PCP - General (Internal Medicine) Francyne Headland, MD as PCP - Cardiology (Cardiology) Jacquline Rogue, MD (Inactive) as Consulting Physician (Internal Medicine) Francyne Headland, MD as Consulting Physician (Cardiology) Lenon Oneil BRAVO, MD as Consulting Physician (Obstetrics and Gynecology) Kristie Lamprey, MD as Consulting Physician (Gastroenterology) Tommas Pears, MD as Consulting Physician (Endocrinology) Fernand Evans, MD as Consulting Physician (Hematology and Oncology) Keenan Hastings, MD as Consulting Physician (Radiation Oncology) Claudene Landry RAMAN, RN as Registered Nurse  CHIEF COMPLAINTS/PURPOSE OF CONSULTATION:  Erythrocytosis   HISTORY OF PRESENTING ILLNESS:  Elizabeth Berg 73 y.o. female is referred to our office by their PCP at [facility] for erythrocytosis.   At the time of referral 01/23/2024 WBC was 6.8, RBC 5.32, Hgb 15.2, Hematocrit 49.1, MCHC low at 31. WBC normal. Platelets 235  She does have a history of breast cancer of the right breast. This was about 14 years ago. She was diagnosed as stage 1. She did not have to have any chemotherapy. She did have radiation (she thinks 16 sessions). Lumpectomy. Last mammogram was about 1 year ago and benign per patient. She is going to have her next within the next few weeks.   She also has a history of BCC. No melanoma.   General health rated as good.  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   She does not have frequent headache. She does have frequent fatigue. No rash. No abdominal pains.   She has lost 10 pounds over the past year - intentionally   Last colonoscopy- about 5 years ago and is getting ready to have this updated within the next few weeks.   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 Eletha    SOCIAL HISTORY: Social History   Socioeconomic History   Marital status:  Married    Spouse name: Not on file   Number of children: 2   Years of education: 12   Highest education level: High school graduate  Occupational History   Not on file  Tobacco Use   Smoking status: Never   Smokeless tobacco: Never  Substance and Sexual Activity   Alcohol use: No   Drug use: No   Sexual activity: Yes  Other Topics Concern   Not on file  Social History Narrative   Lives at home with husband.    Right-handed.   No daily caffeine use.    Social Drivers of Corporate investment banker Strain: Low Risk  (02/08/2024)   Overall Financial Resource Strain (CARDIA)    Difficulty of Paying Living Expenses: Not hard at all  Food Insecurity: No Food Insecurity (02/08/2024)   Hunger Vital Sign    Worried About Running Out of Food in the Last Year: Never true    Ran Out of Food in the Last Year: Never true  Transportation Needs: No Transportation Needs (02/08/2024)   PRAPARE - Administrator, Civil Service (Medical): No    Lack of Transportation (Non-Medical): No  Physical Activity: Sufficiently Active (02/08/2024)   Exercise Vital Sign    Days of Exercise per Week: 5 days    Minutes of Exercise per Session: 30 min  Stress: No Stress Concern Present (02/08/2024)   [employer] of Occupational Health - Occupational Stress Questionnaire    Feeling of Stress: Only a little  Social Connections: Moderately Integrated (02/08/2024)   Social Connection and Isolation Panel    Frequency of Communication with Friends and Family: More than three times a week    Frequency of Social Gatherings with Friends and Family: More than three times a week    Attends Religious Services: More than 4 times per year    Active Member of [employer] or Organizations: No    Attends Banker Meetings: Never    Marital Status: Married  Catering manager Violence: Not At Risk (02/08/2024)   Humiliation, Afraid, Rape, and Kick questionnaire    Fear of Current or Ex-Partner: No    Emotionally Abused: No    Physically Abused: No    Sexually Abused: No    FAMILY HISTORY: Family History  Problem Relation Age of Onset   Esophageal cancer Mother    Cancer Mother        esophageal   Rectal cancer Brother    Cancer Brother 13       rectal    ALLERGIES:  is allergic to penicillins.  MEDICATIONS:  Current Outpatient Medications  Medication Sig Dispense Refill   levothyroxine (SYNTHROID, LEVOTHROID) 137 MCG tablet Take 137 mcg by mouth  daily.     lisinopril (PRINIVIL,ZESTRIL) 10 MG tablet Take 10 mg by mouth daily.      metoprolol  succinate (TOPROL -XL) 25 MG 24 hr tablet Take 1 tablet (25 mg total) by mouth daily. Take one 25mg  tablet daily 90 tablet 3   omeprazole (PRILOSEC) 40 MG capsule Take 40 mg by mouth 4 (four) times a week.      rosuvastatin  (CRESTOR ) 10 MG tablet Take 1 tablet (10 mg total) by mouth 3 (three) times a week. 12 tablet 11   verapamil (VERELAN PM) 120 MG 24 hr capsule Take 120 mg by mouth every morning.     No current facility-administered medications for this visit.    REVIEW OF SYSTEMS:   Constitutional: ( - ) fevers, ( - )  chills , ( - ) night sweats Eyes: ( - ) blurriness of vision, ( - ) double vision, ( - ) watery eyes Ears, nose, mouth, throat, and face: ( - ) mucositis, ( - ) sore throat Respiratory: ( - ) cough, ( - ) dyspnea, ( - ) wheezes Cardiovascular: ( - ) palpitation, ( - ) chest discomfort, ( - ) lower extremity swelling Gastrointestinal:  ( - ) nausea, ( - ) heartburn, ( - ) change in bowel habits Skin: ( - ) abnormal skin rashes Lymphatics: ( - ) new lymphadenopathy, ( - ) easy bruising Neurological: ( - ) numbness, ( - ) tingling, ( - ) new weaknesses Behavioral/Psych: ( - ) mood change, ( - ) new changes  All other systems were reviewed with the patient and are negative.  PHYSICAL EXAMINATION: ECOG PERFORMANCE STATUS: 1 - Symptomatic but completely ambulatory  Vitals:   02/08/24 1305 02/08/24 1310  BP: (!) 156/79 (!) 147/73  Pulse: 66   Resp: 17   Temp: 98.9 F (37.2 C)   SpO2: 100%    Filed Weights   02/08/24 1305  Weight: 204 lb 1.3 oz (92.6 kg)    GENERAL: well appearing female in NAD  SKIN: skin color, texture, turgor are normal, no rashes or significant lesions EYES: conjunctiva are pink and non-injected, sclera clear OROPHARYNX: no exudate, no erythema; lips, buccal mucosa, and tongue normal  NECK: supple, non-tender LYMPH:  no palpable lymphadenopathy  in the cervical, axillary or supraclavicular lymph nodes.  LUNGS: clear to auscultation and percussion with normal breathing effort HEART: regular rate & rhythm and no murmurs and no lower extremity edema ABDOMEN: soft, non-tender, non-distended, normal bowel sounds Musculoskeletal: no cyanosis of digits and no clubbing  PSYCH: alert & oriented x 3, fluent speech NEURO: no focal motor/sensory deficits  LABORATORY DATA:  Pending    ASSESSMENT & PLAN Elizabeth Berg is a 73 y.o. caucasian female who was referred to us  for erythrocytosis  Today we discussed that there are two types of polycythemia, Primary polycythemia and secondary polycythemia. Primary polycythemia is overproduction of red blood cells due to a driver mutation. The most common mutation is the JAK2 [id number] (95% of cases), but there are other mutations which can cause this disorder. Primary polycythemia is a myeloproliferative neoplasm which may require cytoreductive therapy to decrease risk of thrombosis. This can consist of medications or phlebotomy to drive down the red blood cell counts. Secondary polycythemia is polycythemia driven by low oxygen levels. This represents an appropriate response of the body attempting to increase red cell volume. Causes of secondary polycythemia include smoking (most common), obstructive sleep apnea (OSA), or living at altitude. This can also be caused by testosterone supplementation. Certain thalassemias can present with marked erythrocytosis , but normal hemoglobin. Secondary polycythemia does not have the same level of thrombotic risk and therefore does not require cytoreductive therapy or phlebotomy.   #Polycythemia --workup to include CBC, CMP, reticulocyte count, and erythropoietin  level  --patient is a non smoker and does not use any testosterone containing products --no signs of symptoms concerning for OSA at this time however if all primary testing is negative I have recommended  this work up to be completed by PCP.  --will order MPN workup to include JAK2 with reflex and BCR/ABL FISH   All questions were answered. The patient knows to call the clinic with any problems, questions or concerns.  I have spent a total of 55 minutes minutes of face-to-face and  non-face-to-face time, preparing to see the patient, obtaining and/or reviewing separately obtained history, performing a medically appropriate examination, counseling and educating the patient, ordering medications/tests/procedures, referring and communicating with other health care professionals, documenting clinical information in the electronic health record, independently interpreting results and communicating results to the patient, and care coordination.    Lauraine Dais PA-C Department of Hematology/Oncology [facility]

## 2024-02-09 LAB — ERYTHROPOIETIN: Erythropoietin: 10.2 m[IU]/mL (ref 2.6–18.5)

## 2024-02-13 LAB — BCR-ABL1, CML/ALL, PCR, QUANT
E1A2 Transcript: <0.0032 %
Interpretation (BCRAL):: NEGATIVE
b2a2 transcript: <0.0032 %
b3a2 transcript: <0.0032 %

## 2024-02-13 LAB — HEMOCHROMATOSIS DNA-PCR(C282Y,H63D)

## 2024-02-20 ENCOUNTER — Ambulatory Visit
Admission: RE | Admit: 2024-02-20 | Discharge: 2024-02-20 | Disposition: A | Discharge status: Home / Self Care | Source: Ambulatory Visit | Attending: Obstetrics and Gynecology | Admitting: Obstetrics and Gynecology

## 2024-02-20 ENCOUNTER — Ambulatory Visit: Payer: Self-pay | Admitting: Medical Oncology

## 2024-02-20 DIAGNOSIS — Z1231 Encounter for screening mammogram for malignant neoplasm of breast: Secondary | ICD-10-CM

## 2024-03-20 DIAGNOSIS — D0359 Melanoma in situ of other part of trunk: Secondary | ICD-10-CM | POA: Diagnosis not present

## 2024-03-20 DIAGNOSIS — D2362 Other benign neoplasm of skin of left upper limb, including shoulder: Secondary | ICD-10-CM | POA: Diagnosis not present

## 2024-03-20 DIAGNOSIS — D485 Neoplasm of uncertain behavior of skin: Secondary | ICD-10-CM | POA: Diagnosis not present

## 2024-03-20 DIAGNOSIS — D170 Benign lipomatous neoplasm of skin and subcutaneous tissue of head, face and neck: Secondary | ICD-10-CM | POA: Diagnosis not present

## 2024-03-20 DIAGNOSIS — L565 Disseminated superficial actinic porokeratosis (DSAP): Secondary | ICD-10-CM | POA: Diagnosis not present

## 2024-03-20 DIAGNOSIS — Z85828 Personal history of other malignant neoplasm of skin: Secondary | ICD-10-CM | POA: Diagnosis not present

## 2024-03-20 DIAGNOSIS — L905 Scar conditions and fibrosis of skin: Secondary | ICD-10-CM | POA: Diagnosis not present

## 2024-03-20 DIAGNOSIS — D2262 Melanocytic nevi of left upper limb, including shoulder: Secondary | ICD-10-CM | POA: Diagnosis not present

## 2024-03-20 DIAGNOSIS — L821 Other seborrheic keratosis: Secondary | ICD-10-CM | POA: Diagnosis not present

## 2024-03-20 DIAGNOSIS — D2261 Melanocytic nevi of right upper limb, including shoulder: Secondary | ICD-10-CM | POA: Diagnosis not present

## 2024-03-20 DIAGNOSIS — D225 Melanocytic nevi of trunk: Secondary | ICD-10-CM | POA: Diagnosis not present

## 2024-03-21 LAB — INTELLIGEN MYELOID

## 2024-03-23 ENCOUNTER — Ambulatory Visit: Attending: Cardiovascular Disease | Admitting: Cardiovascular Disease

## 2024-03-23 VITALS — BP 141/82 | HR 60 | Ht 65.0 in | Wt 210.0 lb

## 2024-03-23 DIAGNOSIS — R002 Palpitations: Secondary | ICD-10-CM | POA: Diagnosis not present

## 2024-03-23 DIAGNOSIS — R7303 Prediabetes: Secondary | ICD-10-CM

## 2024-03-23 DIAGNOSIS — E66812 Obesity, class 2: Secondary | ICD-10-CM

## 2024-03-23 DIAGNOSIS — I10 Essential (primary) hypertension: Secondary | ICD-10-CM | POA: Diagnosis not present

## 2024-03-23 DIAGNOSIS — Z6835 Body mass index (BMI) 35.0-35.9, adult: Secondary | ICD-10-CM | POA: Diagnosis not present

## 2024-03-23 DIAGNOSIS — E7800 Pure hypercholesterolemia, unspecified: Secondary | ICD-10-CM | POA: Diagnosis not present

## 2024-03-23 NOTE — Progress Notes (Signed)
 Cardiology office note    Date:  04/01/2024   ID:  Elizabeth Berg, DOB Oct 30, 1950, MRN [id number] PCP:  Elizabeth Nottingham, MD  Cardiologist:  Jerel Balding, MD  Electrophysiologist:  None   Chief Complaint:  Palpitations  History of Present Illness:    Elizabeth Berg is a 73 y.o. 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  Her husband Elizabeth Berg is also my patient and so is their son Elizabeth Berg.  She has not been recently troubled by palpitations.  Reports that at home her blood pressure is consistently well-controlled, even though it is slightly high in the office.  This happens at 90 over medical appointments.  The patient specifically denies any chest pain at rest or with exertion, dyspnea at rest or with exertion, orthopnea, paroxysmal nocturnal dyspnea, syncope, palpitations, focal neurological deficits, intermittent claudication, lower extremity edema, unexplained weight gain, cough, hemoptysis or wheezing.  She is now taking her rosuvastatin  5 times a week and her most recent LDL cholesterol 12/02/2023 was 97.  She is due to have a another lipid profile checked next month with Dr. Clarice.    Past Medical History:  Diagnosis Date   Breast cancer (HCC)    low grade invasive ductal ca of right breast   GERD (gastroesophageal reflux disease)    H/O bone density study    H/O colonoscopy 04/14/09   Heart burn    Hypercholesterolemia    Hypertension    Palpitations    Personal history of radiation therapy    right breast 2013   Wears glasses    Past Surgical History:  Procedure Laterality Date   bone density study     BREAST BIOPSY  11/22/2011   right breast biopsy   BREAST LUMPECTOMY  12/20/2011   right breast lumpectomy and right axillary lymph node biopsy   CHOLECYSTECTOMY     COLONOSCOPY     THYROID  SURGERY     TOTAL THYROIDECTOMY  2008   Dr. Eletha      Current Meds  Medication Sig   GAVILYTE-G 236 g solution Take 4,000 mLs by mouth once.   levothyroxine (SYNTHROID, LEVOTHROID) 137 MCG tablet Take 137 mcg by mouth daily.   lisinopril (PRINIVIL,ZESTRIL) 10 MG tablet Take 10 mg by mouth daily.    metoprolol  succinate (TOPROL -XL) 25 MG 24 hr tablet Take 1 tablet (25 mg total) by mouth daily. Take one 25mg  tablet daily   omeprazole (PRILOSEC) 40 MG capsule Take 40 mg by mouth 4 (four) times a week.  (Patient taking differently: Take 40 mg by mouth daily.)   rosuvastatin  (CRESTOR ) 10 MG tablet Take 1 tablet (10 mg total) by mouth 3 (three) times a week. (Patient taking differently: Take 10 mg by mouth daily.)   verapamil (VERELAN PM) 120 MG 24 hr capsule Take 120 mg by mouth every morning.     Allergies:   Penicillins   Social History   Tobacco Use   Smoking status: Never   Smokeless tobacco: Never  Substance Use Topics   Alcohol use: No   Drug use: No     Family Hx: The patient's family history includes Cancer in her mother; Cancer (age of onset: 16) in her brother; Esophageal cancer in her mother; Rectal cancer in her brother.  ROS:   Please see the history of present illness.     All other systems reviewed and are negative.   Prior CV studies:   The  following studies were reviewed today:  Nuclear stress test 2019  Labs/Other Tests and Data Reviewed:    EKG:   EKG Interpretation Date/Time:  Friday March 23 2024 10:36:12 EDT Ventricular Rate:  60 PR Interval:  148 QRS Duration:  80 QT Interval:  446 QTC Calculation: 446 R Axis:   -6  Text Interpretation: Normal sinus rhythm Normal ECG When compared with ECG of 15-Dec-2011 16:09, No significant change was found Confirmed by Oluwatobi Visser ([id number]) on 03/23/2024 10:50:27 AM        Recent Labs:    Latest Ref Rng & Units 02/08/2024    1:44 PM 08/03/2022    2:43 PM 10/28/2014    8:15 AM  BMP  Glucose 70 - 99 mg/dL 95  93  890   BUN 8 - 23 mg/dL 23  20  77.8    Creatinine 0.44 - 1.00 mg/dL 9.10  9.08  1.0   BUN/Creat Ratio 12 - 28  22    Sodium 135 - 145 mmol/L 141  139  142   Potassium 3.5 - 5.1 mmol/L 5.0  4.7  4.7   Chloride 98 - 111 mmol/L 102  99    CO2 22 - 32 mmol/L 28  27  30    Calcium  8.9 - 10.3 mg/dL 9.4  9.2  9.1     Recent Lipid Panel Lipid Panel  No results found for: CHOL, TRIG, HDL, CHOLHDL, VLDL, LDLCALC, LDLDIRECT, LABVLDL  11/17/2021 Cholesterol 176, HDL 49, LDL 117, triglycerides 85  12/02/2023 Cholesterol 170, HDL 51, LDL 97, triglycerides of 123 Hemoglobin A1c 5.7%     Wt Readings from Last 3 Encounters:  03/23/24 210 lb (95.3 kg)  02/08/24 204 lb 1.3 oz (92.6 kg)  04/01/23 220 lb (99.8 kg)     Objective:    Vital Signs:  BP (!) 141/82 (BP Location: Left Arm, Patient Position: Sitting, Cuff Size: Large)   Pulse 60   Ht 5' 5 (1.651 m)   Wt 210 lb (95.3 kg)   LMP 12/20/2006   SpO2 97%   BMI 34.95 kg/m      General: Alert, oriented x3, no distress, moderately obese Head: no evidence of trauma, PERRL, EOMI, no exophtalmos or lid lag, no myxedema, no xanthelasma; normal ears, nose and oropharynx Neck: normal jugular venous pulsations and no hepatojugular reflux; brisk carotid pulses without delay and no carotid bruits Chest: clear to auscultation, no signs of consolidation by percussion or palpation, normal fremitus, symmetrical and full respiratory excursions Cardiovascular: normal position and quality of the apical impulse, regular rhythm, normal first and second heart sounds, no murmurs, rubs or gallops Abdomen: no tenderness or distention, no masses by palpation, no abnormal pulsatility or arterial bruits, normal bowel sounds, no hepatosplenomegaly Extremities: no clubbing, cyanosis or edema; 2+ radial, ulnar and brachial pulses bilaterally; 2+ right femoral, posterior tibial and dorsalis pedis pulses; 2+ left femoral, posterior tibial and dorsalis pedis pulses; no subclavian or femoral  bruits Neurological: grossly nonfocal Psych: Normal mood and affect'    ASSESSMENT & PLAN:     1. Palpitations   2. Prediabetes   3. Class 2 severe obesity due to excess calories with serious comorbidity and body mass index (BMI) of 35.0 to 35.9 in adult (HCC)   4. Hypercholesterolemia   5. Essential hypertension       Palpitations: These to wax and wane depending on emotional stressors.  Currently asymptomatic. Peripheral venous insufficiency: Not a problem at this time.  Edema is controlled with  conventional means. Obesity/prediabetes: Has had some improvement in her weight and metabolic parameters.  Hemoglobin A1c was just borderline at 5.7% and her BMI is now right at 35. HLP: Taking rosuvastatin  4 days a week, LDL in acceptable range at less than 100 HTN: As frequently seen, her blood pressure is borderline high today, but better at home.  No changes made to her medications.  Patient Instructions  Medication Instructions:  No changes *If you need a refill on your cardiac medications before your next appointment, please call your pharmacy*  Lab Work: None ordered If you have labs (blood work) drawn today and your tests are completely normal, you will receive your results only by: MyChart Message (if you have MyChart) OR A paper copy in the mail If you have any lab test that is abnormal or we need to change your treatment, we will call you to review the results.  Testing/Procedures: Please keep a daily BP log for one week and send in over MyChart  Follow-Up: At [facility], you and your health needs are our priority.  As part of our continuing mission to provide you with exceptional heart care, our providers are all part of one team.  This team includes your primary Cardiologist (physician) and Advanced Practice Providers or APPs (Physician Assistants and Nurse Practitioners) who all work together to provide you with the care you need, when you need it.  Your  next appointment:   1 year(s)  Provider:   Jerel Balding, MD    We recommend signing up for the patient portal called MyChart.  Sign up information is provided on this After Visit Summary.  MyChart is used to connect with patients for Virtual Visits (Telemedicine).  Patients are able to view lab/test results, encounter notes, upcoming appointments, etc.  Non-urgent messages can be sent to your provider as well.   To learn more about what you can do with MyChart, go to [email or web address].        Signed, Jerel Balding, MD  04/01/2024 12:52 PM    [facility]

## 2024-03-23 NOTE — Patient Instructions (Signed)
 Medication Instructions:  No changes *If you need a refill on your cardiac medications before your next appointment, please call your pharmacy*  Lab Work: None ordered If you have labs (blood work) drawn today and your tests are completely normal, you will receive your results only by: MyChart Message (if you have MyChart) OR A paper copy in the mail If you have any lab test that is abnormal or we need to change your treatment, we will call you to review the results.  Testing/Procedures: Please keep a daily BP log for one week and send in over MyChart  Follow-Up: At [facility], you and your health needs are our priority.  As part of our continuing mission to provide you with exceptional heart care, our providers are all part of one team.  This team includes your primary Cardiologist (physician) and Advanced Practice Providers or APPs (Physician Assistants and Nurse Practitioners) who all work together to provide you with the care you need, when you need it.  Your next appointment:   1 year(s)  Provider:   Jerel Balding, MD    We recommend signing up for the patient portal called MyChart.  Sign up information is provided on this After Visit Summary.  MyChart is used to connect with patients for Virtual Visits (Telemedicine).  Patients are able to view lab/test results, encounter notes, upcoming appointments, etc.  Non-urgent messages can be sent to your provider as well.   To learn more about what you can do with MyChart, go to [email or web address].

## 2024-03-28 DIAGNOSIS — R1310 Dysphagia, unspecified: Secondary | ICD-10-CM | POA: Diagnosis not present

## 2024-03-28 DIAGNOSIS — K317 Polyp of stomach and duodenum: Secondary | ICD-10-CM | POA: Diagnosis not present

## 2024-03-28 DIAGNOSIS — K219 Gastro-esophageal reflux disease without esophagitis: Secondary | ICD-10-CM | POA: Diagnosis not present

## 2024-03-28 DIAGNOSIS — K449 Diaphragmatic hernia without obstruction or gangrene: Secondary | ICD-10-CM | POA: Diagnosis not present

## 2024-03-28 DIAGNOSIS — Z800 Family history of malignant neoplasm of digestive organs: Secondary | ICD-10-CM | POA: Diagnosis not present

## 2024-03-28 DIAGNOSIS — Z1211 Encounter for screening for malignant neoplasm of colon: Secondary | ICD-10-CM | POA: Diagnosis not present

## 2024-04-01 ENCOUNTER — Encounter: Payer: Self-pay | Admitting: Cardiovascular Disease

## 2024-04-12 DIAGNOSIS — K219 Gastro-esophageal reflux disease without esophagitis: Secondary | ICD-10-CM | POA: Diagnosis not present

## 2024-04-12 DIAGNOSIS — K449 Diaphragmatic hernia without obstruction or gangrene: Secondary | ICD-10-CM | POA: Diagnosis not present

## 2024-04-12 DIAGNOSIS — K5730 Diverticulosis of large intestine without perforation or abscess without bleeding: Secondary | ICD-10-CM | POA: Diagnosis not present

## 2024-04-17 ENCOUNTER — Ambulatory Visit: Admitting: Cardiovascular Disease

## 2024-04-25 DIAGNOSIS — E785 Hyperlipidemia, unspecified: Secondary | ICD-10-CM | POA: Diagnosis not present

## 2024-04-25 DIAGNOSIS — I10 Essential (primary) hypertension: Secondary | ICD-10-CM | POA: Diagnosis not present

## 2024-04-28 ENCOUNTER — Encounter: Payer: Self-pay | Admitting: Cardiovascular Disease

## 2024-05-03 DIAGNOSIS — D0359 Melanoma in situ of other part of trunk: Secondary | ICD-10-CM | POA: Diagnosis not present

## 2024-05-03 DIAGNOSIS — L988 Other specified disorders of the skin and subcutaneous tissue: Secondary | ICD-10-CM | POA: Diagnosis not present

## 2024-05-04 DIAGNOSIS — H5203 Hypermetropia, bilateral: Secondary | ICD-10-CM | POA: Diagnosis not present

## 2024-05-04 DIAGNOSIS — H52223 Regular astigmatism, bilateral: Secondary | ICD-10-CM | POA: Diagnosis not present

## 2024-05-04 DIAGNOSIS — H524 Presbyopia: Secondary | ICD-10-CM | POA: Diagnosis not present

## 2024-05-04 DIAGNOSIS — H2513 Age-related nuclear cataract, bilateral: Secondary | ICD-10-CM | POA: Diagnosis not present

## 2024-05-04 DIAGNOSIS — Z135 Encounter for screening for eye and ear disorders: Secondary | ICD-10-CM | POA: Diagnosis not present

## 2024-05-08 ENCOUNTER — Other Ambulatory Visit: Payer: Self-pay | Admitting: Obstetrics and Gynecology

## 2024-05-08 DIAGNOSIS — N6311 Unspecified lump in the right breast, upper outer quadrant: Secondary | ICD-10-CM

## 2024-05-08 DIAGNOSIS — N63 Unspecified lump in unspecified breast: Secondary | ICD-10-CM | POA: Insufficient documentation

## 2024-05-09 ENCOUNTER — Ambulatory Visit
Admission: RE | Admit: 2024-05-09 | Discharge: 2024-05-09 | Disposition: A | Discharge status: Home / Self Care | Source: Ambulatory Visit | Attending: Obstetrics and Gynecology | Admitting: Obstetrics and Gynecology

## 2024-05-09 DIAGNOSIS — N6489 Other specified disorders of breast: Secondary | ICD-10-CM | POA: Diagnosis not present

## 2024-05-09 DIAGNOSIS — N6311 Unspecified lump in the right breast, upper outer quadrant: Secondary | ICD-10-CM

## 2024-05-09 DIAGNOSIS — R928 Other abnormal and inconclusive findings on diagnostic imaging of breast: Secondary | ICD-10-CM | POA: Diagnosis not present

## 2024-05-14 DIAGNOSIS — L565 Disseminated superficial actinic porokeratosis (DSAP): Secondary | ICD-10-CM | POA: Diagnosis not present

## 2024-05-14 DIAGNOSIS — L814 Other melanin hyperpigmentation: Secondary | ICD-10-CM | POA: Diagnosis not present

## 2024-05-14 DIAGNOSIS — L570 Actinic keratosis: Secondary | ICD-10-CM | POA: Diagnosis not present

## 2024-05-14 DIAGNOSIS — L821 Other seborrheic keratosis: Secondary | ICD-10-CM | POA: Diagnosis not present

## 2024-05-14 DIAGNOSIS — L578 Other skin changes due to chronic exposure to nonionizing radiation: Secondary | ICD-10-CM | POA: Diagnosis not present

## 2024-05-14 DIAGNOSIS — Z86006 Personal history of melanoma in-situ: Secondary | ICD-10-CM | POA: Diagnosis not present

## 2024-05-14 DIAGNOSIS — D1801 Hemangioma of skin and subcutaneous tissue: Secondary | ICD-10-CM | POA: Diagnosis not present

## 2024-05-17 ENCOUNTER — Other Ambulatory Visit

## 2024-05-17 ENCOUNTER — Encounter

## 2024-05-29 ENCOUNTER — Telehealth: Payer: Self-pay | Admitting: Cardiovascular Disease

## 2024-05-29 NOTE — Telephone Encounter (Signed)
 Pt c/o medication issue:  1. Name of Medication: verapamil (VERELAN PM) 120 MG 24 hr capsule   2. How are you currently taking this medication (dosage and times per day)?    3. Are you having a reaction (difficulty breathing--STAT)? no  4. What is your medication issue? [facility], calling to get a prescription for a tablet instead. Please advise

## 2024-05-29 NOTE — Telephone Encounter (Signed)
 Call from spouse asking for verapamil to be changed from capsule to tablet due to supply chain issues at [place]. No recent DPR on file, sent message via [facility] asking for recent BP/HR readings.

## 2024-06-01 NOTE — Telephone Encounter (Signed)
 S/w the patient and she reports that her PCP took care of it. She was thankful for the call and follow up

## 2024-06-01 NOTE — Addendum Note (Signed)
 Addended by: Shaivi Rothschild L on: 06/01/2024 06:01 PM   Modules accepted: Orders

## 2024-06-04 DIAGNOSIS — E890 Postprocedural hypothyroidism: Secondary | ICD-10-CM | POA: Diagnosis not present

## 2024-06-04 DIAGNOSIS — E785 Hyperlipidemia, unspecified: Secondary | ICD-10-CM | POA: Diagnosis not present

## 2024-06-11 DIAGNOSIS — M8580 Other specified disorders of bone density and structure, unspecified site: Secondary | ICD-10-CM | POA: Diagnosis not present

## 2024-06-11 DIAGNOSIS — E785 Hyperlipidemia, unspecified: Secondary | ICD-10-CM | POA: Diagnosis not present

## 2024-06-11 DIAGNOSIS — E890 Postprocedural hypothyroidism: Secondary | ICD-10-CM | POA: Diagnosis not present

## 2024-06-11 DIAGNOSIS — E559 Vitamin D deficiency, unspecified: Secondary | ICD-10-CM | POA: Diagnosis not present

## 2024-06-11 DIAGNOSIS — I10 Essential (primary) hypertension: Secondary | ICD-10-CM | POA: Diagnosis not present

## 2024-06-27 ENCOUNTER — Ambulatory Visit: Attending: Physician Assistant | Admitting: Physician Assistant

## 2024-06-27 ENCOUNTER — Encounter: Payer: Self-pay | Admitting: Physician Assistant

## 2024-06-27 VITALS — BP 152/90 | HR 69 | Ht 64.0 in | Wt 206.2 lb

## 2024-06-27 DIAGNOSIS — E782 Mixed hyperlipidemia: Secondary | ICD-10-CM

## 2024-06-27 DIAGNOSIS — Z01818 Encounter for other preprocedural examination: Secondary | ICD-10-CM | POA: Diagnosis not present

## 2024-06-27 DIAGNOSIS — R079 Chest pain, unspecified: Secondary | ICD-10-CM | POA: Diagnosis not present

## 2024-06-27 NOTE — Patient Instructions (Addendum)
 Medication Instructions:  NO CHANGES *If you need a refill on your cardiac medications before your next appointment, please call your pharmacy*  Lab Work: BMET 1 WEEK PRIOR TO CT If you have labs (blood work) drawn today and your tests are completely normal, you will receive your results only by: MyChart Message (if you have MyChart) OR A paper copy in the mail If you have any lab test that is abnormal or we need to change your treatment, we will call you to review the results.  Testing/Procedures:   Your cardiac CT will be scheduled at one of the below locations:   [facility] [address] [place], [place] [address] [phone] (Severe contrast allergies only)  OR   [facility] [address]  [place], [place] [address]  If scheduled at [facility], please arrive at the [facility] and [facility] Entrance (Entrance C2) of [facility] 30 minutes prior to test start time. You can use the FREE valet parking offered at entrance C (encouraged to control the heart rate for the test)  Proceed to the [facility] Radiology Department (first floor) to check-in and test prep.  All radiology patients and guests should use entrance C2 at [facility], accessed from [place], even though the hospital's physical address listed is [address]  If scheduled at the Heart and Vascular Tower at [address], please enter the parking lot using the [address] entrance and use the FREE valet service at the patient drop-off area. Enter the building and check-in with registration on the main floor.   Please follow these instructions carefully (unless otherwise directed):  An IV will be required for this test and Nitroglycerin will be given.  Hold all erectile dysfunction medications at least 3 days (72 hrs) prior to test. (Ie viagra, cialis, sildenafil, tadalafil, etc)   On the Night Before the  Test: Be sure to Drink plenty of water. Do not consume any caffeinated/decaffeinated beverages or chocolate 12 hours prior to your test. Do not take any antihistamines 12 hours prior to your test.  On the Day of the Test: Drink plenty of water until 1 hour prior to the test. Do not eat any food 1 hour prior to test. You may take your regular medications prior to the test.  HOLD LISINOPRIL THE MORNING OF TEST TAKE 2 TABLETS OF METOPROLOL  SUCCINATE MORNING OF TEST Patients who wear a continuous glucose monitor MUST remove the device prior to scanning. FEMALES- please wear underwire-free bra if available, avoid dresses & tight clothing       After the Test: Drink plenty of water. After receiving IV contrast, you may experience a mild flushed feeling. This is normal. On occasion, you may experience a mild rash up to 24 hours after the test. This is not dangerous. If this occurs, you can take Benadryl 25 mg, Zyrtec, Claritin, or Allegra and increase your fluid intake. (Patients taking Tikosyn should avoid Benadryl, and may take Zyrtec, Claritin, or Allegra) If you experience trouble breathing, this can be serious. If it is severe call 911 IMMEDIATELY. If it is mild, please call our office.  We will call to schedule your test 2-4 weeks out understanding that some insurance companies will need an authorization prior to the service being performed.   For more information and frequently asked questions, please visit our website : [email or web address]  For non-scheduling related questions, please contact the cardiac imaging nurse navigator should  you have any questions/concerns: Cardiac Imaging Nurse Navigators Direct Office Dial: [phone]   For scheduling needs, including cancellations and rescheduling, please call Brittany, [phone].   Follow-Up: At [facility], you and your health needs are our priority.  As part of our continuing mission to provide you with  exceptional heart care, our providers are all part of one team.  This team includes your primary Cardiologist (physician) and Advanced Practice Providers or APPs (Physician Assistants and Nurse Practitioners) who all work together to provide you with the care you need, when you need it.  Your next appointment:   FOLLOW UP SEPTEMBER 2026  Provider:   Jerel Balding, MD   Other Instructions HOLD CRESTOR  FOR 1 WEEK THEN RESUME TAKING MONDAY-FRIDAY ONLY.

## 2024-06-27 NOTE — Progress Notes (Unsigned)
 Cardiology Office Note   Date:  06/29/2024  ID:  Elizabeth Berg, DOB [birth year], MRN [id number] PCP: Clarice Nottingham, MD  [facility] Providers Cardiologist:  Jerel Balding, MD     History of Present Illness Elizabeth Berg is a 73 y.o. 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  Echocardiogram obtained on 06/30/2016 showed EF 60 to 65%, grade 1 DD, trivial MR.  Myoview  obtained on 07/22/2017 showed EF 62%, low risk study.  Coronary calcium  scoring test obtained on 12/20/2023 showed coronary calcium  score 101 which placed the patient in the 70th percentile for age and sex matched control, 2 mm right lower lobe lung nodule.  Patient was last seen by Dr. Balding at which time her blood pressure was borderline high however normal at home, overall she was doing well.  Her palpitations seems to be driven by emotional stressors.  When she got home, she has forwarded us  additional blood pressure readings, her blood pressure has been in the 110s to 120s at home.  For the past month, she has been having bilateral shoulder pain radiating to the left upper chest.  Her pain is 4 out of 10 in intensity.  Her pain seems to be continuous, wax and wane but never completely went away.  Prior to the onset of the pain, her rosuvastatin  was increased, she is currently taking rosuvastatin  10 mg 6 days a week.  I recommended a 1 week rosuvastatin  holiday followed by restarting the rosuvastatin  at 5 days a week dosing, Monday through Friday.  As far as her chest discomfort, it is somewhat atypical, however because of her elevated calcium  score I did recommend a coronary CTA to further assess.  If coronary CTA came back negative, she can follow-up with Dr. Balding in September of next year  ROS:   Patient complains of bilateral shoulder pain radiating to the left chest.  She has no lower extremity edema,  orthopnea or PND.  Studies Reviewed EKG Interpretation Date/Time:  Wednesday June 27 2024 14:12:08 EST Ventricular Rate:  63 PR Interval:  150 QRS Duration:  78 QT Interval:  450 QTC Calculation: 460 R Axis:   3  Text Interpretation: Normal sinus rhythm Normal ECG When compared with ECG of 23-Mar-2024 10:36, No significant change was found Confirmed by Janene Boer [phone]) on 06/27/2024 2:17:28 PM    Cardiac Studies & Procedures   ______________________________________________________________________________________________   STRESS TESTS  MYOCARDIAL PERFUSION IMAGING 07/22/2017  Interpretation Summary  The left ventricular ejection fraction is normal (55-65%).  Nuclear stress EF: 62%. No wall motion abnormalities  There was no ST segment deviation noted during stress.  This is a low risk study. No perfusion defect suggestive of ischemia.  The study is normal.  Oneil Parchment, MD   ECHOCARDIOGRAM  ECHOCARDIOGRAM COMPLETE 06/30/2016  Narrative *[facility]* [address] [place], [place] [address] [phone]  ------------------------------------------------------------------- Transthoracic Echocardiography  Patient:    Elizabeth Berg, Elizabeth Berg MR #:       [id number] Study Date: 06/30/2016 Gender:     F Age:        47 Height:     162.6 cm Weight:     94.9 kg BSA:        2.11 m^2 Pt. Status: Room:  SONOGRAPHER  Celestia Chi ATTENDING    Ezra Shuck, M.D. ORDERING     Jerel Balding, MD REFERRING    Jerel Balding, MD PERFORMING   [facility],  Outpatient  cc:  ------------------------------------------------------------------- LV EF: 60% -   65%  ------------------------------------------------------------------- Indications:      (R00.2).  ------------------------------------------------------------------- History:   PMH:  Palpitations. Acquired from the patient and from the patient&'s chart.  Risk factors:   Hypertension.  ------------------------------------------------------------------- Study Conclusions  - Left ventricle: The cavity size was normal. Wall thickness was normal. Systolic function was normal. The estimated ejection fraction was in the range of 60% to 65%. Wall motion was normal; there were no regional wall motion abnormalities. Doppler parameters are consistent with abnormal left ventricular relaxation (grade 1 diastolic dysfunction). - Aortic valve: There was no stenosis. - Mitral valve: There was trivial regurgitation. - Right ventricle: The cavity size was normal. Systolic function was normal. - Tricuspid valve: Peak RV-RA gradient (S): 22 mm Hg. - Pulmonary arteries: PA peak pressure: 25 mm Hg (S). - Inferior vena cava: The vessel was normal in size. The respirophasic diameter changes were in the normal range (>= 50%), consistent with normal central venous pressure.  Impressions:  - Normal LV size and systolic function, EF 60-65%. Normal RV size and systolic function. No significant valvular abnormalities.  ------------------------------------------------------------------- Study data:  No prior study was available for comparison.  Study status:  Routine.  Procedure:  The patient reported no pain pre or post test. Transthoracic echocardiography for left ventricular function evaluation. Image quality was adequate.  Study completion: There were no complications.          Transthoracic echocardiography.  M-mode, complete 2D, spectral Doppler, and color Doppler.  Birthdate:  Patient birthdate: 1950-10-24.  Age:  Patient is 73 yr old.  Sex:  Gender: female.    BMI: 35.9 kg/m^2.  Blood pressure:     138/96  Patient status:  Outpatient.  Study date: Study date: 06/30/2016. Study time: 02:22 PM.  Location:  [facility]  -------------------------------------------------------------------  ------------------------------------------------------------------- Left  ventricle:  The cavity size was normal. Wall thickness was normal. Systolic function was normal. The estimated ejection fraction was in the range of 60% to 65%. Wall motion was normal; there were no regional wall motion abnormalities. Doppler parameters are consistent with abnormal left ventricular relaxation (grade 1 diastolic dysfunction).  ------------------------------------------------------------------- Aortic valve:   Trileaflet.  Doppler:   There was no stenosis. There was no regurgitation.  ------------------------------------------------------------------- Aorta:  Aortic root: The aortic root was normal in size. Ascending aorta: The ascending aorta was normal in size.  ------------------------------------------------------------------- Mitral valve:   Normal thickness leaflets .  Doppler:   There was no evidence for stenosis.   There was trivial regurgitation. Peak gradient (D): 3 mm Hg.  ------------------------------------------------------------------- Left atrium:  The atrium was normal in size.  ------------------------------------------------------------------- Right ventricle:  The cavity size was normal. Systolic function was normal.  ------------------------------------------------------------------- Pulmonic valve:    Structurally normal valve.   Cusp separation was normal.  Doppler:  Transvalvular velocity was within the normal range. There was no regurgitation.  ------------------------------------------------------------------- Tricuspid valve:   Doppler:  There was trivial regurgitation.  ------------------------------------------------------------------- Right atrium:  The atrium was normal in size.  ------------------------------------------------------------------- Pericardium:  There was no pericardial effusion.  ------------------------------------------------------------------- Systemic veins: Inferior vena cava: The vessel was normal in size.  The respirophasic diameter changes were in the normal range (>= 50%), consistent with normal central venous pressure.  ------------------------------------------------------------------- Measurements  Left ventricle                          Value  Reference LV ID, ED, PLAX chordal                 43.7  mm     43 - 52 LV ID, ES, PLAX chordal                 31    mm     23 - 38 LV fx shortening, PLAX chordal          29    %      >=29 LV PW thickness, ED                     10.2  mm     --------- Stroke volume, 2D                       68    ml     --------- Stroke volume/bsa, 2D                   32    ml/m^2 --------- LV ejection fraction, 1-p A4C           60    %      --------- LV end-diastolic volume, 2-p            77    ml     --------- LV end-systolic volume, 2-p             30    ml     --------- LV ejection fraction, 2-p               61    %      --------- Stroke volume, 2-p                      47    ml     --------- LV end-diastolic volume/bsa, 2-p        36    ml/m^2 --------- LV end-systolic volume/bsa, 2-p         14    ml/m^2 --------- Stroke volume/bsa, 2-p                  22.2  ml/m^2 --------- LV e&', lateral                          8.48  cm/s   --------- LV E/e&', lateral                        10.25        --------- LV e&', medial                           5.95  cm/s   --------- LV E/e&', medial                         14.61        --------- LV e&', average                          7.22  cm/s   --------- LV E/e&', average                        12.04        ---------  LVOT  Value        Reference LVOT ID, S                              18    mm     --------- LVOT area                               2.54  cm^2   --------- LVOT ID                                 18    mm     --------- LVOT peak velocity, S                   133   cm/s   --------- LVOT mean velocity, S                   80.3  cm/s   --------- LVOT VTI, S                              26.8  cm     --------- LVOT peak gradient, S                   7     mm Hg  --------- Stroke volume (SV), LVOT DP             68.2  ml     --------- Stroke index (SV/bsa), LVOT DP          32.3  ml/m^2 ---------  Aorta                                   Value        Reference Aortic root ID, ED                      32    mm     --------- Ascending aorta ID, A-P, S              34    mm     ---------  Left atrium                             Value        Reference LA ID, A-P, ES                          37    mm     --------- LA ID/bsa, A-P                          1.75  cm/m^2 <=2.2 LA volume, S                            58    ml     --------- LA volume/bsa, S                        27.5  ml/m^2 --------- LA volume, ES, 1-p A4C  58    ml     --------- LA volume/bsa, ES, 1-p A4C              27.5  ml/m^2 --------- LA volume, ES, 1-p A2C                  58    ml     --------- LA volume/bsa, ES, 1-p A2C              27.5  ml/m^2 ---------  Mitral valve                            Value        Reference Mitral E-wave peak velocity             86.9  cm/s   --------- Mitral A-wave peak velocity             96.7  cm/s   --------- Mitral deceleration time                225   ms     150 - 230 Mitral peak gradient, D                 3     mm Hg  --------- Mitral E/A ratio, peak                  0.9          ---------  Pulmonary arteries                      Value        Reference PA pressure, S, DP                      25    mm Hg  <=30  Tricuspid valve                         Value        Reference Tricuspid regurg peak velocity          235   cm/s   --------- Tricuspid peak RV-RA gradient           22    mm Hg  ---------  Systemic veins                          Value        Reference Estimated CVP                           3     mm Hg  ---------  Right ventricle                         Value        Reference RV s&', lateral, S                        14.2  cm/s   ---------  Legend: (L)  and  (H)  mark values outside specified reference range.  ------------------------------------------------------------------- Prepared and Electronically Authenticated by  Ezra Shuck, M.D. 2017-12-13T18:54:24    MONITORS  LONG TERM MONITOR (3-14 DAYS) 08/26/2022  Narrative   The dominant rhythm is normal sinus rhythm with normal circadian variation.  There are rare premature atrial contractions and atrial couplets and a few brief episodes of nonsustained ectopic atrial tachycardia (maximum 10 beats).  Atrial fibrillation is not seen.   There are rare premature ventricular contractions and there is a single 4 beat episode of nonsustained tachycardia.   There are no episodes of severe bradycardia or AV block.   Patient's symptoms appear to correlate reasonably well with episodes of atrial tachycardia  Mildly abnormal arrhythmia monitor due to the presence of brief episodes of nonsustained ectopic atrial tachycardia, which appear to correlate with the patient's symptoms.   Patch Wear Time:  13 days and 6 hours (2024-01-16T14:39:36-0500 to 2024-01-29T20:51:05-499)  Patient had a min HR of 50 bpm, max HR of 188 bpm, and avg HR of 64 bpm. Predominant underlying rhythm was Sinus Rhythm. 1 run of Ventricular Tachycardia occurred lasting 4 beats with a max rate of 129 bpm (avg 106 bpm). 14 Supraventricular Tachycardia runs occurred, the run with the fastest interval lasting 7 beats with a max rate of 188 bpm, the longest lasting 10 beats with an avg rate of 165 bpm. Supraventricular Tachycardia was detected within +/- 45 seconds of symptomatic patient event(s). Isolated SVEs were rare (<1.0%), SVE Couplets were rare (<1.0%), and SVE Triplets were rare (<1.0%). Isolated VEs were rare (<1.0%), and no VE Couplets or VE Triplets were present.   CT SCANS  CT CARDIAC SCORING (SELF PAY ONLY) 12/20/2023  Addendum 01/10/2024  9:31 PM ADDENDUM REPORT:  01/10/2024 21:29  EXAM: OVER-READ INTERPRETATION  CT CHEST  The following report is an over-read performed by radiologist Dr. Suzen Dials of [facility] on 01/10/2024. This over-read does not include interpretation of cardiac or coronary anatomy or pathology. The coronary calcium  score/coronary CTA interpretation by the cardiologist is attached.  COMPARISON:  None.  FINDINGS: Cardiovascular: There are no significant extracardiac vascular findings.  Mediastinum/Nodes: There are no enlarged lymph nodes within the visualized mediastinum.  Lungs/Pleura: There is no pleural effusion. A 2 mm pulmonary nodule is seen within the lateral aspect of the right lung base (axial CT image 43, CT series 2).  Upper abdomen: No significant findings in the visualized upper abdomen.  Musculoskeletal/Chest wall: No chest wall mass or suspicious osseous findings within the visualized chest.  IMPRESSION: 2 mm right lower lobe pulmonary nodule. No follow-up needed if patient is low-risk.This recommendation follows the consensus statement: Guidelines for Management of Incidental Pulmonary Nodules Detected on CT Images: From the Fleischner Society 2017; Radiology 2017; 284:228-243.   Electronically Signed By: Suzen Dials M.D. On: 01/10/2024 21:29  Narrative CLINICAL DATA:  Cardiovascular Disease Risk stratification  EXAM: Coronary Calcium  Score  TECHNIQUE: A gated, non-contrast computed tomography scan of the heart was performed using 3 mm slice thickness. Axial images were analyzed on a dedicated workstation. Calcium  scoring of the coronary arteries was performed using the Agatston method.  FINDINGS: Coronary Calcium  Score:  Left main: 0  Left anterior descending artery: 52.1  Left circumflex artery: 29.3  Right coronary artery: 19.5  Total: 101  Percentile: 70th  Pericardium: Normal.  Ascending Aorta: Normal caliber. Aortic  atherosclerosis.  IMPRESSION: 1. Coronary calcium  score of 101. This was 70th percentile for age-, race-, and sex-matched controls.  2. Aortic atherosclerosis.  3. Non-cardiac: See separate report from [facility].  RECOMMENDATIONS: Coronary artery calcium  (CAC) score is a strong predictor of incident coronary heart disease (CHD) and provides predictive information beyond traditional risk factors. CAC scoring is reasonable to use in the decision to withhold,  postpone, or initiate statin therapy in intermediate-risk or selected borderline-risk asymptomatic adults (age 49-75 years and LDL-C >=70 to <190 mg/dL) who do not have diabetes or established atherosclerotic cardiovascular disease (ASCVD).* In intermediate-risk (10-year ASCVD risk >=7.5% to <20%) adults or selected borderline-risk (10-year ASCVD risk >=5% to <7.5%) adults in whom a CAC score is measured for the purpose of making a treatment decision the following recommendations have been made:  If CAC=0, it is reasonable to withhold statin therapy and reassess in 5 to 10 years, as long as higher risk conditions are absent (diabetes mellitus, family history of premature CHD in first degree relatives (males <55 years; females <65 years), cigarette smoking, or LDL >=190 mg/dL).  If CAC is 1 to 99, it is reasonable to initiate statin therapy for patients >=23 years of age.  If CAC is >=100 or >=75th percentile, it is reasonable to initiate statin therapy at any age.  Cardiology referral should be considered for patients with CAC scores >=400 or >=75th percentile.  *2018 AHA/ACC/AACVPR/AAPA/ABC/ACPM/ADA/AGS/APhA/ASPC/NLA/PCNA Guideline on the Management of Blood Cholesterol: A Report of the American College of Cardiology/American Heart Association Task Force on Clinical Practice Guidelines. J Am Coll Cardiol. 2019;73(24):3168-3209.  Sunit Bayou Blue, DO, [facility]  Electronically Signed: By: Madonna Large On:  12/22/2023 22:47     ______________________________________________________________________________________________      Risk Assessment/Calculations          Physical Exam VS:  BP (!) 152/90 (BP Location: Left Arm, Patient Position: Sitting, Cuff Size: Large)   Pulse 69   Ht 5' 4 (1.626 m)   Wt 206 lb 3.2 oz (93.5 kg)   LMP 12/20/2006   SpO2 97%   BMI 35.39 kg/m        Wt Readings from Last 3 Encounters:  06/27/24 206 lb 3.2 oz (93.5 kg)  03/23/24 210 lb (95.3 kg)  02/08/24 204 lb 1.3 oz (92.6 kg)    GEN: Well nourished, well developed in no acute distress NECK: No JVD; No carotid bruits CARDIAC: RRR, no murmurs, rubs, gallops RESPIRATORY:  Clear to auscultation without rales, wheezing or rhonchi  ABDOMEN: Soft, non-tender, non-distended EXTREMITIES:  No edema; No deformity   ASSESSMENT AND PLAN  Precordial chest pain: Bilateral shoulder pain radiating to the left chest.  EKG showed no acute changes.  Symptom has been going on for the past month.  She has a previous coronary calcium  score that was obtained back in June that placed the patient at the 70th percentile for for age and sex matched control.  Will obtain coronary CTA to further assess.  If coronary CTA came back negative, can follow-up with Dr. Francyne in September of next year  Hyperlipidemia: Currently on rosuvastatin  10 mg 6 days a week, however was bilateral shoulder pain, will ask her to take rosuvastatin  holiday for 1 week before resuming at 10 mg Monday through Friday only.       Dispo: Follow-up with Dr. Francyne in September of next year if coronary CTA came back okay.  Signed, Scot Ford, PA

## 2024-07-04 DIAGNOSIS — Z01818 Encounter for other preprocedural examination: Secondary | ICD-10-CM | POA: Diagnosis not present

## 2024-07-04 LAB — BASIC METABOLIC PANEL WITH GFR
BUN/Creatinine Ratio: 22 (ref 12–28)
BUN: 19 mg/dL (ref 8–27)
CO2: 28 mmol/L (ref 20–29)
Calcium: 9.1 mg/dL (ref 8.7–10.3)
Chloride: 99 mmol/L (ref 96–106)
Creatinine, Ser: 0.88 mg/dL (ref 0.57–1.00)
Glucose: 96 mg/dL (ref 70–99)
Potassium: 4.9 mmol/L (ref 3.5–5.2)
Sodium: 139 mmol/L (ref 134–144)
eGFR: 70 mL/min/1.73 (ref >59)

## 2024-07-09 ENCOUNTER — Ambulatory Visit: Payer: Self-pay | Admitting: Physician Assistant

## 2024-07-09 NOTE — Progress Notes (Signed)
Stable renal function and electrolyte

## 2024-07-10 ENCOUNTER — Telehealth (HOSPITAL_COMMUNITY): Payer: Self-pay | Admitting: *Deleted

## 2024-07-10 NOTE — Telephone Encounter (Addendum)
 Called patient regarding results, patient verbalized understanding of results  ----- Message from Scot Ford, [place] sent at 07/09/2024  4:44 PM EST ----- Stable renal function and electrolyte.

## 2024-07-10 NOTE — Telephone Encounter (Signed)
 Reaching out to patient to offer assistance regarding upcoming cardiac imaging study; pt verbalizes understanding of appt date/time, parking situation and where to check in, pre-test NPO status and medications ordered, and verified current allergies; name and call back number provided for further questions should they arise Sid Seats RN Navigator Cardiac Imaging [facility] [phone] office [phone] cell

## 2024-07-11 ENCOUNTER — Ambulatory Visit (HOSPITAL_COMMUNITY)
Admission: RE | Admit: 2024-07-11 | Discharge: 2024-07-11 | Disposition: A | Discharge status: Home / Self Care | Source: Ambulatory Visit | Attending: Cardiovascular Disease | Admitting: Cardiovascular Disease

## 2024-07-11 DIAGNOSIS — I2510 Atherosclerotic heart disease of native coronary artery without angina pectoris: Secondary | ICD-10-CM | POA: Diagnosis not present

## 2024-07-11 DIAGNOSIS — R079 Chest pain, unspecified: Secondary | ICD-10-CM | POA: Diagnosis not present

## 2024-07-11 MED ORDER — NITROGLYCERIN 0.4 MG SL SUBL
0.8000 mg | SUBLINGUAL_TABLET | Freq: Once | SUBLINGUAL | Status: AC
  Administered 2024-07-11: 0.8 mg via SUBLINGUAL

## 2024-07-11 MED ORDER — IOHEXOL 350 MG/ML SOLN
100.0000 mL | Freq: Once | INTRAVENOUS | Status: AC | PRN
  Administered 2024-07-11: 100 mL via INTRAVENOUS

## 2024-08-09 ENCOUNTER — Inpatient Hospital Stay (HOSPITAL_BASED_OUTPATIENT_CLINIC_OR_DEPARTMENT_OTHER): Admitting: Hematology & Oncology

## 2024-08-09 ENCOUNTER — Inpatient Hospital Stay: Attending: Medical Oncology

## 2024-08-09 ENCOUNTER — Other Ambulatory Visit: Payer: Self-pay

## 2024-08-09 ENCOUNTER — Ambulatory Visit: Payer: Self-pay | Admitting: Hematology & Oncology

## 2024-08-09 ENCOUNTER — Encounter: Payer: Self-pay | Admitting: Hematology & Oncology

## 2024-08-09 VITALS — BP 172/79 | HR 64 | Temp 98.3°F | Resp 16 | Ht 63.75 in | Wt 207.0 lb

## 2024-08-09 DIAGNOSIS — Z411 Encounter for cosmetic surgery: Secondary | ICD-10-CM | POA: Insufficient documentation

## 2024-08-09 DIAGNOSIS — C50311 Malignant neoplasm of lower-inner quadrant of right female breast: Secondary | ICD-10-CM

## 2024-08-09 DIAGNOSIS — D751 Secondary polycythemia: Secondary | ICD-10-CM | POA: Insufficient documentation

## 2024-08-09 DIAGNOSIS — L565 Disseminated superficial actinic porokeratosis (DSAP): Secondary | ICD-10-CM | POA: Insufficient documentation

## 2024-08-09 DIAGNOSIS — Z853 Personal history of malignant neoplasm of breast: Secondary | ICD-10-CM | POA: Insufficient documentation

## 2024-08-09 DIAGNOSIS — R718 Other abnormality of red blood cells: Secondary | ICD-10-CM

## 2024-08-09 DIAGNOSIS — Z800 Family history of malignant neoplasm of digestive organs: Secondary | ICD-10-CM | POA: Insufficient documentation

## 2024-08-09 DIAGNOSIS — Z923 Personal history of irradiation: Secondary | ICD-10-CM | POA: Insufficient documentation

## 2024-08-09 DIAGNOSIS — R1310 Dysphagia, unspecified: Secondary | ICD-10-CM | POA: Insufficient documentation

## 2024-08-09 DIAGNOSIS — K5900 Constipation, unspecified: Secondary | ICD-10-CM | POA: Insufficient documentation

## 2024-08-09 DIAGNOSIS — Z79899 Other long term (current) drug therapy: Secondary | ICD-10-CM | POA: Insufficient documentation

## 2024-08-09 LAB — CMP (CANCER CENTER ONLY)
ALT: 23 U/L (ref 0–44)
AST: 20 U/L (ref 15–41)
Albumin: 4.3 g/dL (ref 3.5–5.0)
Alkaline Phosphatase: 76 U/L (ref 38–126)
Anion gap: 9 (ref 5–15)
BUN: 23 mg/dL (ref 8–23)
CO2: 30 mmol/L (ref 22–32)
Calcium: 9.2 mg/dL (ref 8.9–10.3)
Chloride: 102 mmol/L (ref 98–111)
Creatinine: 0.78 mg/dL (ref 0.44–1.00)
GFR, Estimated: >60 mL/min
Glucose, Bld: 112 mg/dL — ABNORMAL HIGH (ref 70–99)
Potassium: 4.8 mmol/L (ref 3.5–5.1)
Sodium: 141 mmol/L (ref 135–145)
Total Bilirubin: 0.6 mg/dL (ref 0.0–1.2)
Total Protein: 7.2 g/dL (ref 6.5–8.1)

## 2024-08-09 LAB — IRON AND IRON BINDING CAPACITY (CC-WL,HP ONLY)
Iron: 92 ug/dL (ref 28–170)
Saturation Ratios: 25 % (ref 10.4–31.8)
TIBC: 363 ug/dL (ref 250–450)
UIBC: 271 ug/dL

## 2024-08-09 LAB — CBC
HCT: 44.9 % (ref 36.0–46.0)
Hemoglobin: 14.9 g/dL (ref 12.0–15.0)
MCH: 29 pg (ref 26.0–34.0)
MCHC: 33.2 g/dL (ref 30.0–36.0)
MCV: 87.5 fL (ref 80.0–100.0)
Platelets: 231 K/uL (ref 150–400)
RBC: 5.13 MIL/uL — ABNORMAL HIGH (ref 3.87–5.11)
RDW: 15.2 % (ref 11.5–15.5)
WBC: 6.9 K/uL (ref 4.0–10.5)
nRBC: 0 % (ref 0.0–0.2)

## 2024-08-09 LAB — FERRITIN: Ferritin: 58 ng/mL (ref 11–307)

## 2024-08-09 NOTE — Progress Notes (Signed)
 " Hematology and Oncology Follow Up Visit  Elizabeth Berg [id number] 1950-08-04 74 y.o. 08/09/2024   Principle Diagnosis:  Erythrocytosis-transient - DNMT3A (+)  Current Therapy:   Observation     Interim History:  Elizabeth Berg is back for second office visit.  She was initially seen back in July 2025.  At that time, her blood counts were all pretty much normal.  She had a very thorough evaluation.  She had a NGS Myeloid panel done.  This was negative outside of the fact that there was a solitary abnormality with the DNMT3A gene.  I explained this to the patient.  I told her that this really is nonsignificant at this point time.  I told her that this is not indicated that she has a bone marrow disorder.  Her blood counts have all been fine.  When she was seen here, her blood counts were okay.  I looked at her blood smear.  I did not see anything that looked suspicious.  She had mature white blood cells.  There were no nucleated red blood cells.  Platelets were adequate in number and size.  She had a hemochromatosis panel done.  This was normal.  She had a BCR/ABL panel.  This is also normal.  Again, I think that there is no hematologic issue at this point.  I do not think that we have to put her through any type of invasive studies such as a bone marrow biopsy.  She feels well.  She had a history of breast cancer.  This is early-stage breast cancer.  She never had chemotherapy.  I told her that if she had chemotherapy, then this might affect her blood counts 10-15 years later.  She has had no problems with weight loss or weight gain.  She has had no rashes.  There is been no nausea or vomiting.  She has had no change in bowel or bladder habits.  She has had no cough or shortness of breath.  She has had no fever.  There is been no problems with COVID or Influenza.  Overall, I would say that her performance status is probably ECOG 0.  Medications: Current  Medications[1]  Allergies: Allergies[2]  Past Medical History, Surgical history, Social history, and Family History were reviewed and updated.  Review of Systems: Review of Systems  Constitutional: Negative.   HENT:  Negative.    Eyes: Negative.   Respiratory: Negative.    Cardiovascular: Negative.   Gastrointestinal: Negative.   Endocrine: Negative.   Genitourinary: Negative.    Musculoskeletal: Negative.   Skin: Negative.   Neurological: Negative.   Hematological: Negative.   Psychiatric/Behavioral: Negative.      Physical Exam:  height is 5' 3.75 (1.619 m) and weight is 207 lb (93.9 kg). Her oral temperature is 98.3 F (36.8 C). Her blood pressure is 172/79 (abnormal) and her pulse is 64. Her respiration is 16 and oxygen saturation is 97%.   Wt Readings from Last 3 Encounters:  08/09/24 207 lb (93.9 kg)  06/27/24 206 lb 3.2 oz (93.5 kg)  03/23/24 210 lb (95.3 kg)    Physical Exam Vitals reviewed.  HENT:     Head: Normocephalic and atraumatic.  Eyes:     Pupils: Pupils are equal, round, and reactive to light.  Cardiovascular:     Rate and Rhythm: Normal rate and regular rhythm.     Heart sounds: Normal heart sounds.  Pulmonary:     Effort: Pulmonary effort is normal.  Breath sounds: Normal breath sounds.  Abdominal:     General: Bowel sounds are normal.     Palpations: Abdomen is soft.  Musculoskeletal:        General: No tenderness or deformity. Normal range of motion.     Cervical back: Normal range of motion.  Lymphadenopathy:     Cervical: No cervical adenopathy.  Skin:    General: Skin is warm and dry.     Findings: No erythema or rash.  Neurological:     Mental Status: She is alert and oriented to person, place, and time.  Psychiatric:        Behavior: Behavior normal.        Thought Content: Thought content normal.        Judgment: Judgment normal.     Lab Results  Component Value Date   WBC 6.9 08/09/2024   HGB 14.9 08/09/2024   HCT  44.9 08/09/2024   MCV 87.5 08/09/2024   PLT 231 08/09/2024     Chemistry      Component Value Date/Time   NA 141 08/09/2024 0958   NA 139 07/04/2024 1200   NA 142 10/28/2014 0815   K 4.8 08/09/2024 0958   K 4.7 10/28/2014 0815   CL 102 08/09/2024 0958   CL 104 01/05/2013 0822   CO2 30 08/09/2024 0958   CO2 30 (H) 10/28/2014 0815   BUN 23 08/09/2024 0958   BUN 19 07/04/2024 1200   BUN 22.1 10/28/2014 0815   CREATININE 0.78 08/09/2024 0958   CREATININE 1.0 10/28/2014 0815      Component Value Date/Time   CALCIUM  9.2 08/09/2024 0958   CALCIUM  9.1 10/28/2014 0815   ALKPHOS 76 08/09/2024 0958   ALKPHOS 83 10/28/2014 0815   AST 20 08/09/2024 0958   AST 22 10/28/2014 0815   ALT 23 08/09/2024 0958   ALT 22 10/28/2014 0815   BILITOT 0.6 08/09/2024 0958   BILITOT 0.57 10/28/2014 0815      Impression and Plan: Elizabeth Berg is a very charming 74 year old white female.  She certainly does not look 74 years old.  She has 2 grandchildren.  She has 2 kids.  Again, I cannot find anything with her lab work that looks suspicious.  Again, having a solitary mutation does not indicate that she we will develop a hematologic issue.  However, I do think that it probably would be wise to follow her.  I would like to get her back in 6 months.  We will have to keep an eye on her blood counts.  I reassured her that I just do not think that there was anything that we have to worry about right now.  I just do not think that she would have a significant risk in the future of developing anything that would be of significance clinically.  She does have a very strong faith.  It was nice that we talked about that.     Elizabeth JONELLE Crease, MD 1/22/202612:50 PM    [1]  Current Outpatient Medications:    levothyroxine (SYNTHROID, LEVOTHROID) 137 MCG tablet, Take 137 mcg by mouth daily., Disp: , Rfl:    lisinopril (PRINIVIL,ZESTRIL) 10 MG tablet, Take 10 mg by mouth daily. , Disp: , Rfl:     metoprolol  succinate (TOPROL -XL) 25 MG 24 hr tablet, Take 1 tablet (25 mg total) by mouth daily. Take one 25mg  tablet daily, Disp: 90 tablet, Rfl: 3   omeprazole (PRILOSEC) 40 MG capsule, Take 40 mg by mouth  4 (four) times a week.  (Patient taking differently: Take 40 mg by mouth daily.), Disp: , Rfl:    rosuvastatin  (CRESTOR ) 10 MG tablet, Take 1 tablet (10 mg total) by mouth 3 (three) times a week. (Patient taking differently: Take 10 mg by mouth daily.), Disp: 12 tablet, Rfl: 11 [2]  Allergies Allergen Reactions   Penicillins Other (See Comments)    Childhood allergy; reaction unknown   "

## 2025-02-07 ENCOUNTER — Inpatient Hospital Stay

## 2025-02-07 ENCOUNTER — Inpatient Hospital Stay: Admitting: Hematology & Oncology
